# Patient Record
Sex: Male | Born: 1935
Health system: Southern US, Community
[De-identification: ages and names within clinical notes are randomized; demographics above are authoritative.]

## PROBLEM LIST (undated history)

## (undated) DIAGNOSIS — M1990 Unspecified osteoarthritis, unspecified site: Secondary | ICD-10-CM

## (undated) DIAGNOSIS — Z9889 Other specified postprocedural states: Secondary | ICD-10-CM

## (undated) DIAGNOSIS — G4730 Sleep apnea, unspecified: Secondary | ICD-10-CM

## (undated) DIAGNOSIS — H269 Unspecified cataract: Secondary | ICD-10-CM

## (undated) DIAGNOSIS — R0902 Hypoxemia: Secondary | ICD-10-CM

## (undated) DIAGNOSIS — J189 Pneumonia, unspecified organism: Secondary | ICD-10-CM

## (undated) DIAGNOSIS — Z8679 Personal history of other diseases of the circulatory system: Secondary | ICD-10-CM

## (undated) DIAGNOSIS — G4733 Obstructive sleep apnea (adult) (pediatric): Secondary | ICD-10-CM

## (undated) DIAGNOSIS — Z8739 Personal history of other diseases of the musculoskeletal system and connective tissue: Secondary | ICD-10-CM

## (undated) DIAGNOSIS — R7301 Impaired fasting glucose: Secondary | ICD-10-CM

## (undated) DIAGNOSIS — R319 Hematuria, unspecified: Secondary | ICD-10-CM

## (undated) DIAGNOSIS — R58 Hemorrhage, not elsewhere classified: Secondary | ICD-10-CM

## (undated) DIAGNOSIS — M7540 Impingement syndrome of unspecified shoulder: Secondary | ICD-10-CM

## (undated) DIAGNOSIS — C801 Malignant (primary) neoplasm, unspecified: Secondary | ICD-10-CM

## (undated) DIAGNOSIS — K649 Unspecified hemorrhoids: Secondary | ICD-10-CM

## (undated) DIAGNOSIS — R809 Proteinuria, unspecified: Secondary | ICD-10-CM

## (undated) DIAGNOSIS — Z8546 Personal history of malignant neoplasm of prostate: Secondary | ICD-10-CM

## (undated) DIAGNOSIS — Z85828 Personal history of other malignant neoplasm of skin: Secondary | ICD-10-CM

## (undated) DIAGNOSIS — E785 Hyperlipidemia, unspecified: Secondary | ICD-10-CM

## (undated) DIAGNOSIS — I4430 Unspecified atrioventricular block: Secondary | ICD-10-CM

## (undated) DIAGNOSIS — M169 Osteoarthritis of hip, unspecified: Secondary | ICD-10-CM

## (undated) DIAGNOSIS — I10 Essential (primary) hypertension: Secondary | ICD-10-CM

## (undated) DIAGNOSIS — M109 Gout, unspecified: Secondary | ICD-10-CM

## (undated) HISTORY — DX: Gout, unspecified: M10.9

## (undated) HISTORY — PX: TOTAL HIP ARTHROPLASTY: SHX124

## (undated) HISTORY — DX: Obstructive sleep apnea (adult) (pediatric): G47.33

## (undated) HISTORY — DX: Sleep apnea, unspecified: G47.30

## (undated) HISTORY — DX: Malignant (primary) neoplasm, unspecified: C80.1

## (undated) HISTORY — DX: Hypoxemia: R09.02

## (undated) HISTORY — PX: JOINT REPLACEMENT: SHX530

## (undated) HISTORY — DX: Proteinuria, unspecified: R80.9

## (undated) HISTORY — DX: Unspecified hemorrhoids: K64.9

## (undated) HISTORY — DX: Impingement syndrome of unspecified shoulder: M75.40

## (undated) HISTORY — DX: Unspecified cataract: H26.9

## (undated) HISTORY — DX: Impaired fasting glucose: R73.01

## (undated) HISTORY — PX: CARDIAC CATHETERIZATION: SHX172

## (undated) HISTORY — DX: Osteoarthritis of hip, unspecified: M16.9

## (undated) HISTORY — PX: TONSILLECTOMY: SUR1361

## (undated) HISTORY — PX: PROSTATE SURGERY: SHX751

## (undated) HISTORY — DX: Hemorrhage, not elsewhere classified: R58

## (undated) HISTORY — PX: MOHS SURGERY: SUR867

## (undated) HISTORY — PX: COLONOSCOPY: SHX174

## (undated) HISTORY — DX: Unspecified atrioventricular block: I44.30

---

## 2002-08-20 ENCOUNTER — Encounter: Payer: Self-pay | Admitting: Urology

## 2002-08-20 ENCOUNTER — Encounter: Admission: RE | Admit: 2002-08-20 | Discharge: 2002-08-20 | Payer: Self-pay | Admitting: Urology

## 2002-08-26 ENCOUNTER — Ambulatory Visit: Admission: RE | Admit: 2002-08-26 | Discharge: 2002-11-21 | Payer: Self-pay | Admitting: Radiation Oncology

## 2002-11-22 ENCOUNTER — Ambulatory Visit: Admission: RE | Admit: 2002-11-22 | Discharge: 2003-02-20 | Payer: Self-pay | Admitting: Radiation Oncology

## 2003-07-21 ENCOUNTER — Ambulatory Visit (HOSPITAL_COMMUNITY): Admission: RE | Admit: 2003-07-21 | Discharge: 2003-07-21 | Payer: Self-pay | Admitting: Internal Medicine

## 2004-06-08 ENCOUNTER — Ambulatory Visit (HOSPITAL_COMMUNITY): Admission: RE | Admit: 2004-06-08 | Discharge: 2004-06-08 | Payer: Self-pay | Admitting: Internal Medicine

## 2005-12-02 ENCOUNTER — Encounter: Admission: RE | Admit: 2005-12-02 | Discharge: 2005-12-02 | Payer: Self-pay | Admitting: Internal Medicine

## 2007-08-13 HISTORY — PX: EYE SURGERY: SHX253

## 2007-08-13 HISTORY — PX: OTHER SURGICAL HISTORY: SHX169

## 2007-12-29 ENCOUNTER — Ambulatory Visit: Payer: Self-pay | Admitting: Internal Medicine

## 2008-01-26 ENCOUNTER — Encounter: Payer: Self-pay | Admitting: Internal Medicine

## 2008-01-26 ENCOUNTER — Ambulatory Visit: Payer: Self-pay | Admitting: Internal Medicine

## 2008-01-27 ENCOUNTER — Encounter: Payer: Self-pay | Admitting: Internal Medicine

## 2009-08-12 DIAGNOSIS — R809 Proteinuria, unspecified: Secondary | ICD-10-CM

## 2009-08-12 HISTORY — DX: Proteinuria, unspecified: R80.9

## 2010-06-15 ENCOUNTER — Emergency Department (HOSPITAL_COMMUNITY): Admission: EM | Admit: 2010-06-15 | Discharge: 2010-06-15 | Payer: Self-pay | Admitting: Family Medicine

## 2011-06-07 ENCOUNTER — Other Ambulatory Visit (HOSPITAL_COMMUNITY): Payer: Medicare Other

## 2011-06-12 ENCOUNTER — Inpatient Hospital Stay (HOSPITAL_COMMUNITY): Admission: RE | Admit: 2011-06-12 | Payer: Medicare Other | Source: Ambulatory Visit | Admitting: Orthopedic Surgery

## 2011-06-14 NOTE — H&P (Signed)
NAME:  Lance Martin, Lance Martin NO.:  [id number]  MEDICAL RECORD NO.:  [id number]  LOCATION:                                 FACILITY:  PHYSICIAN:  Ollen Gross, M.D.    DATE OF BIRTH:  Dec 21, 1932  DATE OF ADMISSION:  06/12/2011 DATE OF DISCHARGE:                             HISTORY & PHYSICAL   CHIEF COMPLAINT:  Left hip pain.  HISTORY OF PRESENT ILLNESS:  Patient is a 77 year old male seen by Dr. Lequita Halt for ongoing hip pain.  He has had progressive left hip pain for quite some time now.  Over the past half year, it has progressively gotten much worse.  It is to the point where now is limiting his activity.  He has been seen in the office.  His x-rays show he is already developed advanced bone-on-bone arthritis in that hip and has had significant progression and x-rays compared to earlier this year. It is felt due to his continued pain limited impacted function and lack of improvement with medications that is felt he would benefit undergoing surgical intervention.  Risks and benefits have been discussed.  He elects to proceed with surgery.  ALLERGIES:  No known drug allergies.  Intolerance is to ALLOPURINOL caused "renal failure."  CURRENT MEDICATIONS:  Lipitor, Lotensin, amlodipine, Bystolic, furosemide, potassium chloride, TriCor, sulfapyridine, Flonase nasal spray, baby aspirin, Aleve, fish oil.  PAST MEDICAL HISTORY: 1. Cataracts. 2. Hypertension. 3. Hypercholesterolemia. 4. Hemorrhoids. 5. Prostate disease/cancer. 6. History of gout.  PAST SURGICAL HISTORY: 1. Tonsillectomy at age 71. 2. Prostate cancer.  Procedure with radiation. 3. Left ulnar nerve decompression. 4. Cataract surgery. 5. Colonoscopy x2 with removal of benign polyps.  FAMILY HISTORY:  Father deceased at age 64 with history of stroke. Mother deceased at age 68 "old age."  SOCIAL HISTORY:  Married, retired Armed forces operational officer, nonsmoker, 3 to 4 drinks a day, has 2 adopted children.  Has  not decided yet, but he may want to look into a skilled facility for inpatient rehab.  He does have a living will healthcare power of attorney.  REVIEW OF SYSTEMS:  GENERAL:  No fever, chills, or night sweats.  NEURO: No seizures, syncope, or paralysis.  RESPIRATORY:  No shortness breath, productive cough, or hemoptysis.  CARDIOVASCULAR:  No chest pain or orthopnea.  GI:  No nausea, vomiting, diarrhea, or constipation.  GU: Little bit of hematuria, intermittent, this has been worked up in the past.  No dysuria or frequency.  MUSCULOSKELETAL:  Hip pain.  PHYSICAL EXAMINATION:  VITAL SIGNS:  Pulse 64, respirations 12, blood pressure 132/68. General:  A 77 year old white male, well nourished, well developed, in no acute distress.  He is alert, oriented, and cooperative, good historian. HEENT:  Normocephalic, atraumatic.  Pupils are round and reactive.  EOMs intact.  Never wear glasses. NECK:  Supple. CHEST:  Clear. HEART:  Regular rate and rhythm.  S1, S2 noted.  Early faint systolic murmur, best heard over aortic point. ABDOMEN:  Soft, nontender.  Bowel sounds are present. RECTAL/BREASTS/GENITALIA:  Not done.  Not part of present illness. EXTREMITIES:  Right hip flexion 120, internal rotation 30, external rotation 40, abduction 40.  Left hip flexion  90, 0 internal rotation, only about 20 degrees external rotation, about 20 degrees abduction.  IMPRESSION:  Osteoarthritis, left hip.  PLAN:  The patient was admitted to [facility] to undergo a left total hip replacement arthroplasty.  Surgery will be performed by Dr. Ollen Gross.  There are no contraindications of the above-stated procedure such as ongoing infection or progressive neurological disease. Patient either look into home with [facility].     Alexzandrew L. Julien Girt, P.A.C.   ______________________________ Ollen Gross, M.D.    ALP/MEDQ  D:  06/11/2011  T:  06/12/2011   Job:  [id number]  cc:   Loraine Leriche A. Perini, M.D. Fax: [phone]  Electronically Signed by Patrica Duel P.A.C. on 06/13/2011 10:31:23 AM Electronically Signed by Ollen Gross M.D. on 06/14/2011 09:55:57 AM

## 2011-07-09 NOTE — H&P (Signed)
NAME:  Lance Martin, Lance Martin NO.:  [id number]  MEDICAL RECORD NO.:  [id number]  LOCATION:  [facility]                        FACILITY:  [facility]  PHYSICIAN:  Lance Martin, M.D.    DATE OF BIRTH:  11-29-1932  DATE OF ADMISSION:  07/31/2011 DATE OF DISCHARGE:                             HISTORY & PHYSICAL   ADMITTING DIAGNOSIS:  Left hip osteoarthritis.  HISTORY OF PRESENT ILLNESS:  This 77 year old gentleman with a history of left hip osteoarthritis and pain, which has failed conservative measures.  After discussions of treatment options, the patient is now scheduled for total hip arthroplasty of his left hip.  The surgery, risks, benefits, and aftercare were discussed in detail with the patient.  Questions invited and answered.  Note that his medical doctor is Dr. Rodrigo Martin.  He plans on going home after surgery, but his wife had recent back surgery, so he may need to go to a nursing facility based on his progress postoperatively.  DRUG ALLERGIES:  ALLOPURINOL.  CURRENT MEDICATIONS: 1. Lipitor 20 mg daily. 2. Lotensin 40 mg daily. 3. Amlodipine 5 mg daily. 4. Bystolic 20 mg daily. 5. Lasix 40 mg daily. 6. Potassium chloride 10 mEq daily. 7. TriCor 48 mg daily. 8. Sulfapyridine 200 mg daily. 9. Flonase 2 squirts each nostril each day. 10.Aspirin 81 mg b.i.d. 11.Aleve 1 b.i.d. 12.Fish oil.  SERIOUS MEDICAL ILLNESSES:  Include hyperlipidemia, hypertension, allergies.  PREVIOUS SURGERIES:  Include tonsillectomy, ulnar nerve decompression of the wrist and elbow on the left, bilateral cataracts, colonoscopies for benign polyps, and prostate cancer with irradiation.  FAMILY HISTORY:  Positive for stroke and peripheral vascular disease.  SOCIAL HISTORY:  The patient is married.  He is a retired Armed forces operational officer. He does not smoke and has 3 to 4 drinks per day.  REVIEW OF SYSTEMS:  CENTRAL NERVOUS SYSTEM:  Negative for headache or dizziness.  PULMONARY:  Negative for  shortness of breath, PND, or orthopnea.  CARDIOVASCULAR:  Positive for hypertension and hypercholesterolemia.  Negative for chest pain or palpitation.  GI: Positive for history of hemorrhoids.  GU:  Positive for history of prostate cancer with radiation.  MUSCULOSKELETAL:  Positive in HPI.  No history of DVT, PE, or sleep apnea.  PHYSICAL EXAMINATION:  VITAL SIGNS:  Today, blood pressure 140/72, respirations 16, pulse 68 and regular. GENERAL APPEARANCE:  Well-developed, well-nourished gentleman, in no acute distress.  HEENT:  Head, normocephalic.  Nose patent.  Nares patent.  Pupils equal, round, reactive to light.  Throat without injection. NECK:  Supple without adenopathy.  Carotids 2+ without bruit.  CHEST: Clear auscultation.  No rales or rhonchi.  Respirations 16. HEART:  Regular rate and rhythm at 68 beats/minute with a 1/6 systolic ejection murmur. ABDOMEN:  Soft.  Active bowel sounds.  No masses, organomegaly. NEUROLOGIC:  Patient alert, oriented to time, place, and person. Cranial nerves II through XII grossly intact. EXTREMITIES:  Shows the left hip with decreased range of motion with no internal rotation, 20 degrees of external rotation, flexion of 90 with pain at all extremes.  Neurovascular status intact.  IMPRESSION:  Osteoarthritis of the left hip.  PLAN:  Total hip arthroplasty, left hip  [name]. [name], P.A.   ______________________________ Lance Martin, M.D.    SJC/MEDQ  D:  07/09/2011  T:  07/09/2011  Job:  [id number]

## 2011-07-09 NOTE — H&P (Signed)
H&P performed 07/09/2011. Dictation # [id number]

## 2011-07-24 ENCOUNTER — Encounter (HOSPITAL_COMMUNITY): Payer: Self-pay | Admitting: Pharmacy Technician

## 2011-07-26 ENCOUNTER — Ambulatory Visit (HOSPITAL_COMMUNITY)
Admission: RE | Admit: 2011-07-26 | Discharge: 2011-07-26 | Disposition: A | Discharge status: Home / Self Care | Payer: Medicare Other | Source: Ambulatory Visit | Attending: Orthopedic Surgery | Admitting: Orthopedic Surgery

## 2011-07-26 ENCOUNTER — Encounter (HOSPITAL_COMMUNITY)
Admission: RE | Admit: 2011-07-26 | Discharge: 2011-07-26 | Disposition: A | Discharge status: Home / Self Care | Payer: Medicare Other | Source: Ambulatory Visit | Attending: Orthopedic Surgery | Admitting: Orthopedic Surgery

## 2011-07-26 ENCOUNTER — Encounter (HOSPITAL_COMMUNITY): Payer: Self-pay

## 2011-07-26 ENCOUNTER — Other Ambulatory Visit: Payer: Self-pay

## 2011-07-26 DIAGNOSIS — Z01810 Encounter for preprocedural cardiovascular examination: Secondary | ICD-10-CM | POA: Insufficient documentation

## 2011-07-26 DIAGNOSIS — R9431 Abnormal electrocardiogram [ECG] [EKG]: Secondary | ICD-10-CM | POA: Insufficient documentation

## 2011-07-26 DIAGNOSIS — Z01812 Encounter for preprocedural laboratory examination: Secondary | ICD-10-CM | POA: Insufficient documentation

## 2011-07-26 DIAGNOSIS — M1610 Unilateral primary osteoarthritis, unspecified hip: Secondary | ICD-10-CM | POA: Insufficient documentation

## 2011-07-26 DIAGNOSIS — Z01818 Encounter for other preprocedural examination: Secondary | ICD-10-CM | POA: Insufficient documentation

## 2011-07-26 DIAGNOSIS — M169 Osteoarthritis of hip, unspecified: Secondary | ICD-10-CM | POA: Insufficient documentation

## 2011-07-26 HISTORY — DX: Unspecified osteoarthritis, unspecified site: M19.90

## 2011-07-26 HISTORY — DX: Essential (primary) hypertension: I10

## 2011-07-26 LAB — COMPREHENSIVE METABOLIC PANEL
ALT: 15 U/L (ref 0–53)
Alkaline Phosphatase: 52 U/L (ref 39–117)
Chloride: 101 mEq/L (ref 96–112)
GFR calc Af Amer: 54 mL/min — ABNORMAL LOW (ref >90)
Glucose, Bld: 99 mg/dL (ref 70–99)
Potassium: 4 mEq/L (ref 3.5–5.1)
Sodium: 141 mEq/L (ref 135–145)
Total Protein: 7.3 g/dL (ref 6.0–8.3)

## 2011-07-26 LAB — URINALYSIS, ROUTINE W REFLEX MICROSCOPIC
Glucose, UA: NEGATIVE mg/dL
Hgb urine dipstick: NEGATIVE
Protein, ur: NEGATIVE mg/dL
Specific Gravity, Urine: 1.008 (ref 1.005–1.030)

## 2011-07-26 LAB — CBC
Hemoglobin: 13.5 g/dL (ref 13.0–17.0)
MCHC: 34.7 g/dL (ref 30.0–36.0)
RBC: 4.1 MIL/uL — ABNORMAL LOW (ref 4.22–5.81)
WBC: 7.3 10*3/uL (ref 4.0–10.5)

## 2011-07-26 LAB — SURGICAL PCR SCREEN
MRSA, PCR: NEGATIVE
Staphylococcus aureus: POSITIVE — AB

## 2011-07-26 LAB — APTT: aPTT: 30 seconds (ref 24–37)

## 2011-07-26 NOTE — Patient Instructions (Signed)
[address] [facility]  07/26/2011   Your procedure is scheduled on: Wednesday 12/19  AT 2:00 PM  Report to [facility] at 11:30 AM.  Call this number if you have problems the morning of surgery: [phone]   Remember:   Do not eat food AFTER MIDNIGHT - THE NIGHT BEFORE YOUR SURGERY  May have clear liquids FROM MIDNIGHT UNTIL 8:00 AM THE AM OF YOUR SURGERY.  Clear liquids include soda, tea, black coffee, apple or grape juice.  NOTHING TO DRINK AFTER 8:OOAM THE DAY OF YOUR SURGERY  Take these medicines the morning of surgery with A SIP OF WATER: NO MEDS TO TAKE   Do not wear jewelry, make-up or nail polish.  Do not wear lotions, powders, or perfumes. You may wear deodorant.  Do not shave 48 hours prior to surgery.  Do not bring valuables to the hospital.  Contacts, dentures or bridgework may not be worn into surgery.  Leave suitcase in the car. After surgery it may be brought to your room.  For patients admitted to the hospital, checkout time is 11:00 AM the day of discharge.   Patients discharged the day of surgery will not be allowed to drive home.    Special Instructions: CHG Shower Use Special Wash: 1/2 bottle night before surgery and 1/2 bottle morning of surgery.   Please read over the following fact sheets that you were given: Blood Transfusion Information and MRSA Information AND INCENTIVE SPIROMETER INSTRUCTIONS

## 2011-07-30 MED ORDER — BUPIVACAINE 0.25 % ON-Q PUMP SINGLE CATH 300ML
300.0000 mL | INJECTION | Status: DC
  Filled 2011-07-30: qty 300

## 2011-07-31 ENCOUNTER — Inpatient Hospital Stay (HOSPITAL_COMMUNITY): Payer: Medicare Other | Admitting: Anesthesiology

## 2011-07-31 ENCOUNTER — Inpatient Hospital Stay (HOSPITAL_COMMUNITY)
Admission: RE | Admit: 2011-07-31 | Discharge: 2011-08-04 | DRG: 470 | Disposition: A | Discharge status: Home Health | Payer: Medicare Other | Source: Ambulatory Visit | Attending: Orthopedic Surgery | Admitting: Orthopedic Surgery

## 2011-07-31 ENCOUNTER — Encounter (HOSPITAL_COMMUNITY): Payer: Self-pay | Admitting: Anesthesiology

## 2011-07-31 ENCOUNTER — Inpatient Hospital Stay (HOSPITAL_COMMUNITY): Payer: Medicare Other

## 2011-07-31 ENCOUNTER — Encounter (HOSPITAL_COMMUNITY): Payer: Self-pay | Admitting: *Deleted

## 2011-07-31 ENCOUNTER — Encounter (HOSPITAL_COMMUNITY)
Admission: RE | Disposition: A | Discharge status: Home Health | Payer: Self-pay | Source: Ambulatory Visit | Attending: Orthopedic Surgery

## 2011-07-31 ENCOUNTER — Encounter (HOSPITAL_COMMUNITY): Payer: Self-pay | Admitting: Orthopedic Surgery

## 2011-07-31 DIAGNOSIS — I10 Essential (primary) hypertension: Secondary | ICD-10-CM | POA: Diagnosis present

## 2011-07-31 DIAGNOSIS — E785 Hyperlipidemia, unspecified: Secondary | ICD-10-CM | POA: Diagnosis present

## 2011-07-31 DIAGNOSIS — M1610 Unilateral primary osteoarthritis, unspecified hip: Principal | ICD-10-CM | POA: Diagnosis present

## 2011-07-31 DIAGNOSIS — Z8546 Personal history of malignant neoplasm of prostate: Secondary | ICD-10-CM

## 2011-07-31 DIAGNOSIS — M169 Osteoarthritis of hip, unspecified: Principal | ICD-10-CM | POA: Diagnosis present

## 2011-07-31 HISTORY — PX: TOTAL HIP ARTHROPLASTY: SHX124

## 2011-07-31 LAB — TYPE AND SCREEN
ABO/RH(D): O POS
Antibody Screen: NEGATIVE

## 2011-07-31 LAB — ABO/RH: ABO/RH(D): O POS

## 2011-07-31 SURGERY — ARTHROPLASTY, HIP, TOTAL,POSTERIOR APPROACH
Anesthesia: Spinal | Site: Hip | Laterality: Left | Wound class: Clean

## 2011-07-31 MED ORDER — PHENOL 1.4 % MT LIQD: 1.0000 | OROMUCOSAL | Status: DC | PRN

## 2011-07-31 MED ORDER — ONDANSETRON HCL 4 MG/2ML IJ SOLN: 4.0000 mg | Freq: Four times a day (QID) | INTRAMUSCULAR | Status: DC | PRN

## 2011-07-31 MED ORDER — PROPOFOL 10 MG/ML IV EMUL
INTRAVENOUS | Status: DC | PRN
  Administered 2011-07-31: 100 ug/kg/min via INTRAVENOUS

## 2011-07-31 MED ORDER — LACTATED RINGERS IV SOLN
INTRAVENOUS | Status: DC | PRN
  Administered 2011-07-31 (×3): via INTRAVENOUS

## 2011-07-31 MED ORDER — TEMAZEPAM 15 MG PO CAPS: 15.0000 mg | ORAL_CAPSULE | Freq: Every evening | ORAL | Status: DC | PRN

## 2011-07-31 MED ORDER — CEFAZOLIN SODIUM 1-5 GM-% IV SOLN
INTRAVENOUS | Status: AC
  Filled 2011-07-31: qty 50

## 2011-07-31 MED ORDER — POTASSIUM CHLORIDE CRYS ER 10 MEQ PO TBCR
10.0000 meq | EXTENDED_RELEASE_TABLET | Freq: Every day | ORAL | Status: DC
  Administered 2011-07-31 – 2011-08-04 (×5): 10 meq via ORAL
  Filled 2011-07-31 (×5): qty 1

## 2011-07-31 MED ORDER — LACTATED RINGERS IV SOLN: INTRAVENOUS | Status: DC

## 2011-07-31 MED ORDER — FUROSEMIDE 20 MG PO TABS
20.0000 mg | ORAL_TABLET | ORAL | Status: DC
  Administered 2011-07-31 – 2011-08-04 (×3): 20 mg via ORAL
  Filled 2011-07-31 (×3): qty 1

## 2011-07-31 MED ORDER — POTASSIUM CHLORIDE IN NACL 20-0.45 MEQ/L-% IV SOLN
INTRAVENOUS | Status: DC
  Administered 2011-07-31 – 2011-08-01 (×3): via INTRAVENOUS
  Filled 2011-07-31 (×5): qty 1000

## 2011-07-31 MED ORDER — BUPIVACAINE LIPOSOME 1.3 % IJ SUSP
20.0000 mL | Freq: Once | INTRAMUSCULAR | Status: DC
  Filled 2011-07-31: qty 20

## 2011-07-31 MED ORDER — METHOCARBAMOL 100 MG/ML IJ SOLN
500.0000 mg | Freq: Four times a day (QID) | INTRAVENOUS | Status: DC | PRN
  Administered 2011-07-31: 500 mg via INTRAVENOUS
  Filled 2011-07-31: qty 5

## 2011-07-31 MED ORDER — ACETAMINOPHEN 10 MG/ML IV SOLN
1000.0000 mg | Freq: Four times a day (QID) | INTRAVENOUS | Status: AC
  Administered 2011-07-31 – 2011-08-01 (×4): 1000 mg via INTRAVENOUS
  Filled 2011-07-31 (×4): qty 100

## 2011-07-31 MED ORDER — HYDROMORPHONE HCL PF 1 MG/ML IJ SOLN
0.2500 mg | INTRAMUSCULAR | Status: DC | PRN
  Administered 2011-07-31: 0.5 mg via INTRAVENOUS

## 2011-07-31 MED ORDER — BENAZEPRIL HCL 40 MG PO TABS
40.0000 mg | ORAL_TABLET | Freq: Every day | ORAL | Status: DC
  Administered 2011-07-31 – 2011-08-02 (×3): 40 mg via ORAL
  Filled 2011-07-31 (×5): qty 1

## 2011-07-31 MED ORDER — ONDANSETRON HCL 4 MG PO TABS: 4.0000 mg | ORAL_TABLET | Freq: Four times a day (QID) | ORAL | Status: DC | PRN

## 2011-07-31 MED ORDER — NEBIVOLOL HCL 10 MG PO TABS
20.0000 mg | ORAL_TABLET | Freq: Every day | ORAL | Status: DC
  Administered 2011-07-31 – 2011-08-03 (×4): 20 mg via ORAL
  Filled 2011-07-31 (×5): qty 2

## 2011-07-31 MED ORDER — DEXAMETHASONE SODIUM PHOSPHATE 10 MG/ML IJ SOLN
INTRAMUSCULAR | Status: DC | PRN
  Administered 2011-07-31: 10 mg via INTRAVENOUS

## 2011-07-31 MED ORDER — METHOCARBAMOL 500 MG PO TABS
500.0000 mg | ORAL_TABLET | Freq: Four times a day (QID) | ORAL | Status: DC | PRN
  Administered 2011-08-01 – 2011-08-03 (×8): 500 mg via ORAL
  Filled 2011-07-31 (×8): qty 1

## 2011-07-31 MED ORDER — HYDROMORPHONE HCL PF 1 MG/ML IJ SOLN
INTRAMUSCULAR | Status: AC
  Filled 2011-07-31: qty 1

## 2011-07-31 MED ORDER — BISACODYL 10 MG RE SUPP: 10.0000 mg | Freq: Every day | RECTAL | Status: DC | PRN

## 2011-07-31 MED ORDER — DOCUSATE SODIUM 100 MG PO CAPS
100.0000 mg | ORAL_CAPSULE | Freq: Two times a day (BID) | ORAL | Status: DC
  Administered 2011-07-31 – 2011-08-03 (×7): 100 mg via ORAL
  Filled 2011-07-31 (×9): qty 1

## 2011-07-31 MED ORDER — BUPIVACAINE HCL 0.75 % IJ SOLN
INTRAMUSCULAR | Status: DC | PRN
  Administered 2011-07-31: 15 mg via INTRATHECAL

## 2011-07-31 MED ORDER — CEFAZOLIN SODIUM-DEXTROSE 2-3 GM-% IV SOLR
2.0000 g | Freq: Once | INTRAVENOUS | Status: AC
  Administered 2011-07-31: 2 g via INTRAVENOUS

## 2011-07-31 MED ORDER — RIVAROXABAN 10 MG PO TABS
10.0000 mg | ORAL_TABLET | Freq: Every day | ORAL | Status: DC
  Administered 2011-08-01 – 2011-08-04 (×4): 10 mg via ORAL
  Filled 2011-07-31 (×4): qty 1

## 2011-07-31 MED ORDER — BUPIVACAINE LIPOSOME 1.3 % IJ SUSP
INTRAMUSCULAR | Status: DC | PRN
  Administered 2011-07-31: 20 mL

## 2011-07-31 MED ORDER — ACETAMINOPHEN 325 MG PO TABS: 650.0000 mg | ORAL_TABLET | Freq: Four times a day (QID) | ORAL | Status: DC | PRN

## 2011-07-31 MED ORDER — MIDAZOLAM HCL 5 MG/5ML IJ SOLN
INTRAMUSCULAR | Status: DC | PRN
  Administered 2011-07-31 (×4): 1 mg via INTRAVENOUS

## 2011-07-31 MED ORDER — EPHEDRINE SULFATE 50 MG/ML IJ SOLN
INTRAMUSCULAR | Status: DC | PRN
  Administered 2011-07-31 (×3): 5 mg via INTRAVENOUS

## 2011-07-31 MED ORDER — ACETAMINOPHEN 650 MG RE SUPP: 650.0000 mg | Freq: Four times a day (QID) | RECTAL | Status: DC | PRN

## 2011-07-31 MED ORDER — MENTHOL 3 MG MT LOZG: 1.0000 | LOZENGE | OROMUCOSAL | Status: DC | PRN

## 2011-07-31 MED ORDER — FLUTICASONE PROPIONATE 50 MCG/ACT NA SUSP
2.0000 | Freq: Every day | NASAL | Status: DC | PRN
  Filled 2011-07-31: qty 16

## 2011-07-31 MED ORDER — SIMVASTATIN 40 MG PO TABS
40.0000 mg | ORAL_TABLET | Freq: Every day | ORAL | Status: DC
  Administered 2011-07-31: 40 mg via ORAL
  Filled 2011-07-31 (×2): qty 1

## 2011-07-31 MED ORDER — AMLODIPINE BESYLATE 5 MG PO TABS
5.0000 mg | ORAL_TABLET | Freq: Every evening | ORAL | Status: DC
  Administered 2011-07-31 – 2011-08-03 (×4): 5 mg via ORAL
  Filled 2011-07-31 (×5): qty 1

## 2011-07-31 MED ORDER — DIPHENHYDRAMINE HCL 12.5 MG/5ML PO ELIX: 12.5000 mg | ORAL_SOLUTION | ORAL | Status: DC | PRN

## 2011-07-31 MED ORDER — POLYETHYLENE GLYCOL 3350 17 G PO PACK
17.0000 g | PACK | Freq: Every day | ORAL | Status: DC | PRN
  Administered 2011-08-03: 17 g via ORAL
  Filled 2011-07-31 (×2): qty 1

## 2011-07-31 MED ORDER — METOCLOPRAMIDE HCL 5 MG/ML IJ SOLN: 5.0000 mg | Freq: Three times a day (TID) | INTRAMUSCULAR | Status: DC | PRN

## 2011-07-31 MED ORDER — METOCLOPRAMIDE HCL 10 MG PO TABS: 5.0000 mg | ORAL_TABLET | Freq: Three times a day (TID) | ORAL | Status: DC | PRN

## 2011-07-31 MED ORDER — FENOFIBRATE 54 MG PO TABS
54.0000 mg | ORAL_TABLET | Freq: Every day | ORAL | Status: DC
  Administered 2011-07-31 – 2011-08-03 (×4): 54 mg via ORAL
  Filled 2011-07-31 (×5): qty 1

## 2011-07-31 MED ORDER — FLEET ENEMA 7-19 GM/118ML RE ENEM: 1.0000 | ENEMA | Freq: Once | RECTAL | Status: AC | PRN

## 2011-07-31 MED ORDER — OXYCODONE HCL 5 MG PO TABS
5.0000 mg | ORAL_TABLET | ORAL | Status: DC | PRN
  Administered 2011-08-01 (×3): 5 mg via ORAL
  Administered 2011-08-01: 10 mg via ORAL
  Administered 2011-08-02 (×2): 5 mg via ORAL
  Administered 2011-08-02 – 2011-08-04 (×7): 10 mg via ORAL
  Filled 2011-07-31: qty 1
  Filled 2011-07-31: qty 2
  Filled 2011-07-31: qty 1
  Filled 2011-07-31 (×4): qty 2
  Filled 2011-07-31: qty 1
  Filled 2011-07-31 (×2): qty 2
  Filled 2011-07-31 (×2): qty 1
  Filled 2011-07-31: qty 2

## 2011-07-31 MED ORDER — ACETAMINOPHEN 10 MG/ML IV SOLN
INTRAVENOUS | Status: DC | PRN
  Administered 2011-07-31: 1000 mg via INTRAVENOUS

## 2011-07-31 MED ORDER — CEFAZOLIN SODIUM 1-5 GM-% IV SOLN
1.0000 g | Freq: Four times a day (QID) | INTRAVENOUS | Status: AC
  Administered 2011-07-31 – 2011-08-01 (×3): 1 g via INTRAVENOUS
  Filled 2011-07-31 (×3): qty 50

## 2011-07-31 MED ORDER — KETAMINE HCL 10 MG/ML IJ SOLN
INTRAMUSCULAR | Status: DC | PRN
  Administered 2011-07-31 (×4): 10 mg via INTRAVENOUS

## 2011-07-31 MED ORDER — FENTANYL CITRATE 0.05 MG/ML IJ SOLN
INTRAMUSCULAR | Status: DC | PRN
  Administered 2011-07-31: 100 ug via INTRAVENOUS

## 2011-07-31 MED ORDER — CEFAZOLIN SODIUM-DEXTROSE 2-3 GM-% IV SOLR: 2.0000 g | Freq: Once | INTRAVENOUS | Status: DC

## 2011-07-31 MED ORDER — MORPHINE SULFATE 2 MG/ML IJ SOLN
1.0000 mg | INTRAMUSCULAR | Status: DC | PRN
  Administered 2011-07-31 – 2011-08-01 (×4): 2 mg via INTRAVENOUS
  Filled 2011-07-31 (×5): qty 1

## 2011-07-31 MED ORDER — PROMETHAZINE HCL 25 MG/ML IJ SOLN: 6.2500 mg | INTRAMUSCULAR | Status: DC | PRN

## 2011-07-31 MED ORDER — LORATADINE 10 MG PO TABS
10.0000 mg | ORAL_TABLET | Freq: Every day | ORAL | Status: DC
  Administered 2011-07-31 – 2011-08-03 (×4): 10 mg via ORAL
  Filled 2011-07-31 (×5): qty 1

## 2011-07-31 SURGICAL SUPPLY — 54 items
BAG SPEC THK2 15X12 ZIP CLS (MISCELLANEOUS) ×1
BAG ZIPLOCK 12X15 (MISCELLANEOUS) ×2 IMPLANT
BIT DRILL 2.8X128 (BIT) ×2 IMPLANT
BLADE EXTENDED COATED 6.5IN (ELECTRODE) ×2 IMPLANT
BLADE SAW SAG 73X25 THK (BLADE) ×1
BLADE SAW SGTL 73X25 THK (BLADE) ×1 IMPLANT
CLOTH BEACON ORANGE TIMEOUT ST (SAFETY) ×2 IMPLANT
DECANTER SPIKE VIAL GLASS SM (MISCELLANEOUS) ×2 IMPLANT
DRAPE INCISE IOBAN 66X45 STRL (DRAPES) ×2 IMPLANT
DRAPE ORTHO SPLIT 77X108 STRL (DRAPES) ×4
DRAPE POUCH INSTRU U-SHP 10X18 (DRAPES) ×2 IMPLANT
DRAPE SURG ORHT 6 SPLT 77X108 (DRAPES) ×2 IMPLANT
DRAPE U-SHAPE 47X51 STRL (DRAPES) ×2 IMPLANT
DRESSING ALLEVYN BORDER 5X5 (GAUZE/BANDAGES/DRESSINGS) ×1 IMPLANT
DRESSING ALLEVYN BORDER HEEL (GAUZE/BANDAGES/DRESSINGS) ×1 IMPLANT
DRSG ADAPTIC 3X8 NADH LF (GAUZE/BANDAGES/DRESSINGS) ×2 IMPLANT
DRSG MEPILEX BORDER 4X4 (GAUZE/BANDAGES/DRESSINGS) ×2 IMPLANT
DRSG MEPILEX BORDER 4X8 (GAUZE/BANDAGES/DRESSINGS) ×2 IMPLANT
DURAPREP 26ML APPLICATOR (WOUND CARE) ×2 IMPLANT
ELECT REM PT RETURN 9FT ADLT (ELECTROSURGICAL) ×2
ELECTRODE REM PT RTRN 9FT ADLT (ELECTROSURGICAL) ×1 IMPLANT
EVACUATOR 1/8 PVC DRAIN (DRAIN) ×2 IMPLANT
FACESHIELD LNG OPTICON STERILE (SAFETY) ×8 IMPLANT
GAUZE SPONGE 4X4 12PLY STRL LF (GAUZE/BANDAGES/DRESSINGS) ×1 IMPLANT
GLOVE BIO SURGEON STRL SZ7.5 (GLOVE) ×2 IMPLANT
GLOVE BIO SURGEON STRL SZ8 (GLOVE) ×3 IMPLANT
GLOVE BIOGEL PI IND STRL 8 (GLOVE) ×2 IMPLANT
GLOVE BIOGEL PI INDICATOR 8 (GLOVE) ×2
GOWN STRL NON-REIN LRG LVL3 (GOWN DISPOSABLE) ×2 IMPLANT
GOWN STRL REIN XL XLG (GOWN DISPOSABLE) ×2 IMPLANT
IMMOBILIZER KNEE 20 (SOFTGOODS)
IMMOBILIZER KNEE 20 THIGH 36 (SOFTGOODS) IMPLANT
KIT BASIN OR (CUSTOM PROCEDURE TRAY) ×2 IMPLANT
MANIFOLD NEPTUNE II (INSTRUMENTS) ×2 IMPLANT
NDL SAFETY ECLIPSE 18X1.5 (NEEDLE) ×1 IMPLANT
NEEDLE HYPO 18GX1.5 SHARP (NEEDLE) ×4
NS IRRIG 1000ML POUR BTL (IV SOLUTION) ×2 IMPLANT
PACK TOTAL JOINT (CUSTOM PROCEDURE TRAY) ×2 IMPLANT
PASSER SUT SWANSON 36MM LOOP (INSTRUMENTS) ×2 IMPLANT
POSITIONER SURGICAL ARM (MISCELLANEOUS) ×2 IMPLANT
SPONGE GAUZE 4X4 12PLY (GAUZE/BANDAGES/DRESSINGS) ×2 IMPLANT
STRIP CLOSURE SKIN 1/2X4 (GAUZE/BANDAGES/DRESSINGS) ×4 IMPLANT
SUT ETHIBOND NAB CT1 #1 30IN (SUTURE) ×4 IMPLANT
SUT MNCRL AB 4-0 PS2 18 (SUTURE) ×2 IMPLANT
SUT VIC AB 1 CT1 27 (SUTURE) ×6
SUT VIC AB 1 CT1 27XBRD ANTBC (SUTURE) ×3 IMPLANT
SUT VIC AB 2-0 CT1 27 (SUTURE) ×6
SUT VIC AB 2-0 CT1 TAPERPNT 27 (SUTURE) ×3 IMPLANT
SYR 20CC LL (SYRINGE) ×1 IMPLANT
SYR 50ML LL SCALE MARK (SYRINGE) ×2 IMPLANT
TOWEL OR 17X26 10 PK STRL BLUE (TOWEL DISPOSABLE) ×5 IMPLANT
TOWEL OR NON WOVEN STRL DISP B (DISPOSABLE) ×2 IMPLANT
TRAY FOLEY CATH 14FRSI W/METER (CATHETERS) ×2 IMPLANT
WATER STERILE IRR 1500ML POUR (IV SOLUTION) ×2 IMPLANT

## 2011-07-31 NOTE — Interval H&P Note (Signed)
History and Physical Interval Note:  07/31/2011 3:17 PM  Lance Martin  has presented today for surgery, with the diagnosis of osteoarthritis left hip   The various methods of treatment have been discussed with the patient and family. After consideration of risks, benefits and other options for treatment, the patient has consented to  Procedure(s): TOTAL HIP ARTHROPLASTY as a surgical intervention .  The patients' history has been reviewed, patient examined, no change in status, stable for surgery.  I have reviewed the patients' chart and labs.  Questions were answered to the patient's satisfaction.     Loanne Drilling

## 2011-07-31 NOTE — Anesthesia Postprocedure Evaluation (Signed)
  Anesthesia Post-op Note  Patient: Lance Martin  Procedure(s) Performed:  TOTAL HIP ARTHROPLASTY  Patient Location: PACU  Anesthesia Type: Spinal  Level of Consciousness: awake and alert   Airway and Oxygen Therapy: Patient Spontanous Breathing  Post-op Pain: mild  Post-op Assessment: Post-op Vital signs reviewed, Patient's Cardiovascular Status Stable, Respiratory Function Stable, Patent Airway and No signs of Nausea or vomiting  Post-op Vital Signs: stable  Complications: No apparent anesthesia complications. Moving both feet. Denies complaints.

## 2011-07-31 NOTE — Op Note (Signed)
Pre-operative diagnosis- Osteoarthritis Left hip  Post-operative diagnosis- Osteoarthritis  Left hip  Procedure-  LeftTotal Hip Arthroplasty  Surgeon- Gus Rankin. Manoj Enriquez, MD  Assistant- Avel Peace, PA-C   Anesthesia  Spinal  EBL- 200   Drain Hemovac   Complication- None  Condition-PACU - hemodynamically stable.   Brief Clinical Note- Lance Martin is a 77 y.o. male with end stage arthritis of his left hip with progressively worsening pain and dysfunction. Pain occurs with activity and rest including pain at night. He has tried analgesics, protected weight bearing and rest without benefit. Pain is too severe to attempt physical therapy. Radiographs demonstrate bone on bone arthritis with subchondral cyst formation. He presents now for left THA.  Procedure in detail-   The patient is brought into the operating room and placed on the operating table. After successful administration of Spinal  anesthesia, the patient is placed in the  right lateral decubitus position with the  Left side up and held in place with the hip positioner. The lower extremity is isolated from the perineum with plastic drapes and time-out is performed by the surgical team. The lower extremity is then prepped and draped in the usual sterile fashion. A short posterolateral incision is made with a ten blade through the subcutaneous tissue to the level of the fascia lata which is incised in line with the skin incision. The sciatic nerve is palpated and protected and the short external rotators and capsule are isolated from the femur. The hip is then dislocated and the center of the femoral head is marked. A trial prosthesis is placed such that the trial head corresponds to the center of the patients' native femoral head. The resection level is marked on the femoral neck and the resection is made with an oscillating saw. The femoral head is removed and femoral retractors placed to gain access to the femoral canal.      The  canal finder is passed into the femoral canal and the canal is thoroughly irrigated with sterile saline to remove the fatty contents. Axial reaming is performed to 13.5  mm, proximal reaming to 67F  and the sleeve machined to a large. A 67F large trial sleeve is placed into the proximal femur.      The femur is then retracted anteriorly to gain acetabular exposure. Acetabular retractors are placed and the labrum and osteophytes are removed, Acetabular reaming is performed to 53  mm and a 54  mm Pinnacle acetabular shell is placed in anatomic position with excellent purchase. Additional dome screws were placed. An apex hole eliminator is placed and the permanent 36 mm neutral plus 4 Marathon liner is placed into the acetabular shell.      The trial femur is then placed into the femoral canal. The size is 18 x 13  stem with a 36 + 12  neck and a 36 + 6 head with the neck version 10 degrees beyond the patients' native anteversion. The hip is reduced with excellent stability with full extension and full external rotation, 70 degrees flexion with 40 degrees adduction and 90 degrees internal rotation and 90 degrees of flexion with 70 degrees of internal rotation. The operative leg is placed on top of the non-operative leg and the leg lengths are found to be equal. The trials are then removed and the permanent implant of the same size is impacted into the femoral canal. The metal femoral head of the same size as the trial is placed and the hip is  reduced with the same stability parameters. The operative leg is again placed on top of the non-operative leg and the leg lengths are found to be equal.      The wound is then copiously irrigated with saline solution and the capsule and short external rotators are re-attached to the femur through drill holes with Ethibond suture. The fascia lata is closed over a hemovac drain with #1 vicryl suture and the fascia lata, gluteal muscles and subcutaneous tissues are injected with  Exparel 20ml diluted with saline 50ml. The subcutaneous tissues are closed with #1 and2-0 vicryl and the subcuticular layer closed with running 4-0 Monocryl. The drain is hooked to suction, incision cleaned and dried, and steri-srips and a bulky sterile dressing applied. The limb is placed into a knee immobilizer and the patient is awakened and transported to recovery in stable condition.      Please note that a surgical assistant was a medical necessity for this procedure in order to perform it in a safe and expeditious manner. The assistant was necessary to provide retraction to the vital neurovascular structures and to retract and position the limb to allow for anatomic placement of the prosthetic components.  Gus Rankin Rafael Quesada, MD    07/31/2011, 4:37 PM

## 2011-07-31 NOTE — Transfer of Care (Signed)
Immediate Anesthesia Transfer of Care Note  Patient: Lance Martin  Procedure(s) Performed:  TOTAL HIP ARTHROPLASTY  Patient Location: PACU  Anesthesia Type: Spinal  Level of Consciousness: awake, alert  and patient cooperative  Airway & Oxygen Therapy: Patient Spontanous Breathing and Patient connected to face mask oxygen  Post-op Assessment: Report given to PACU RN and Post -op Vital signs reviewed and stable  Post vital signs: Reviewed and stable  Complications: No apparent anesthesia complications

## 2011-07-31 NOTE — Anesthesia Procedure Notes (Addendum)
Spinal  Patient location during procedure: OR Start time: 07/31/2011 3:33 PM Staffing Anesthesiologist: Azell Der Performed by: anesthesiologist  Preanesthetic Checklist Completed: patient identified, site marked, surgical consent, pre-op evaluation, timeout performed, IV checked, risks and benefits discussed and monitors and equipment checked Spinal Block Patient position: sitting Prep: Betadine Patient monitoring: heart rate, continuous pulse ox and blood pressure Approach: midline Location: L3-4 Injection technique: single-shot Needle Needle type: Spinocan  Needle gauge: 22 G Needle length: 9 cm Additional Notes Expiration date of kit checked and confirmed. Patient tolerated procedure well, without complications. No paresthesia. No blood in CSF.

## 2011-07-31 NOTE — Anesthesia Preprocedure Evaluation (Addendum)
Anesthesia Evaluation  Patient identified by MRN, date of birth, ID band Patient awake    Reviewed: Allergy & Precautions, H&P , NPO status , Patient's Chart, lab work & pertinent test results  Airway Mallampati: II TM Distance: >3 FB Neck ROM: Full    Dental No notable dental hx.    Pulmonary neg pulmonary ROS,  clear to auscultation  Pulmonary exam normal       Cardiovascular hypertension, Pt. on medications neg cardio ROS Regular Normal    Neuro/Psych Negative Neurological ROS  Negative Psych ROS   GI/Hepatic negative GI ROS, Neg liver ROS,   Endo/Other  Negative Endocrine ROS  Renal/GU negative Renal ROS  Genitourinary negative   Musculoskeletal negative musculoskeletal ROS (+)   Abdominal   Peds negative pediatric ROS (+)  Hematology negative hematology ROS (+)   Anesthesia Other Findings   Reproductive/Obstetrics negative OB ROS                           Anesthesia Physical Anesthesia Plan  ASA: II  Anesthesia Plan: Spinal   Post-op Pain Management:    Induction: Intravenous  Airway Management Planned: Simple Face Mask  Additional Equipment:   Intra-op Plan:   Post-operative Plan: Extubation in OR  Informed Consent: I have reviewed the patients History and Physical, chart, labs and discussed the procedure including the risks, benefits and alternatives for the proposed anesthesia with the patient or authorized representative who has indicated his/her understanding and acceptance.   Dental advisory given  Plan Discussed with: CRNA  Anesthesia Plan Comments: (Discussed r/b spinal including bleeding, infection, nerve damage, headache, failure, backache. Pt. Prefers spinal.)       Anesthesia Quick Evaluation

## 2011-08-01 LAB — BASIC METABOLIC PANEL
Chloride: 98 mEq/L (ref 96–112)
GFR calc Af Amer: 58 mL/min — ABNORMAL LOW (ref >90)
GFR calc non Af Amer: 50 mL/min — ABNORMAL LOW (ref >90)
Potassium: 3.8 mEq/L (ref 3.5–5.1)
Sodium: 136 mEq/L (ref 135–145)

## 2011-08-01 LAB — CBC
HCT: 32.2 % — ABNORMAL LOW (ref 39.0–52.0)
MCHC: 34.8 g/dL (ref 30.0–36.0)
Platelets: 179 10*3/uL (ref 150–400)
RDW: 12.6 % (ref 11.5–15.5)
WBC: 9.4 10*3/uL (ref 4.0–10.5)

## 2011-08-01 MED ORDER — ATORVASTATIN CALCIUM 10 MG PO TABS
20.0000 mg | ORAL_TABLET | Freq: Every day | ORAL | Status: DC
  Administered 2011-08-02 – 2011-08-03 (×2): 20 mg via ORAL
  Filled 2011-08-01 (×3): qty 2

## 2011-08-01 NOTE — Progress Notes (Signed)
Physical Therapy Treatment Patient Details Name: Lance Martin MRN: [id number] DOB: 18-Nov-1932 Today's Date: 08/01/2011 1430 - 1457; 2GT PT Assessment/Plan  PT - Assessment/Plan PT Plan: Discharge plan remains appropriate PT Frequency: 7X/week Follow Up Recommendations: Home health PT Equipment Recommended: Rolling walker with 5" wheels PT Goals  Acute Rehab PT Goals Time For Goal Achievement: 5 days Pt will go Supine/Side to Sit: with modified independence PT Goal: Supine/Side to Sit - Progress: Progressing toward goal Pt will go Sit to Stand: with modified independence PT Goal: Sit to Stand - Progress: Progressing toward goal Pt will go Stand to Sit: with modified independence PT Goal: Stand to Sit - Progress: Progressing toward goal Pt will Ambulate: >150 feet;with modified independence;with rolling walker PT Goal: Ambulate - Progress: Progressing toward goal Pt will Perform Home Exercise Program: with min assist  PT Treatment Precautions/Restrictions  Precautions Precautions: Posterior Hip Precaution Booklet Issued: Yes (comment) Precaution Comments: instructed pt in posterior hip precautions Required Braces or Orthoses: No Restrictions Weight Bearing Restrictions: Yes LLE Weight Bearing: Partial weight bearing LLE Partial Weight Bearing Percentage or Pounds: 25-50% Mobility (including Balance) Bed Mobility Sit to Supine - Right: 4: Min assist Sit to Supine - Right Details (indicate cue type and reason): cues for sequence and adherence to THP Transfers Sit to Stand: 4: Min assist;With armrests;With upper extremity assist;From chair/3-in-1 Sit to Stand Details (indicate cue type and reason): cues for LE position and use of UEs Stand to Sit: 4: Min assist;To bed;With upper extremity assist Stand to Sit Details: cues for LE position and use of UEs Ambulation/Gait Ambulation/Gait Assistance: 4: Min assist Ambulation/Gait Assistance Details (indicate cue type and reason):  cues for sequence, ER on L, position from RW and step-to gait Ambulation Distance (Feet): 98 Feet Assistive device: Rolling walker Gait Pattern: Step-to pattern    Exercise    End of Session PT - End of Session Activity Tolerance: Patient tolerated treatment well Patient left: in bed;with call bell in reach;with family/visitor present Nurse Communication: Mobility status for transfers;Mobility status for ambulation General Behavior During Session: [facility] for tasks performed Cognition: [facility] for tasks performed  Colston Pyle 08/01/2011, 3:43 PM

## 2011-08-01 NOTE — Progress Notes (Signed)
Physical Therapy Evaluation Patient Details Name: Lance Martin MRN: [id number] DOB: May 18, 1933 Today's Date: 08/01/2011  11:08-11:32 Eval 2  Problem List: There is no problem list on file for this patient.   Past Medical History:  Past Medical History  Diagnosis Date  . Hypertension   . Arthritis     OA LEFT HIP-PLANS LEFT TOTAL HIP REPLACEMENT   Past Surgical History:  Past Surgical History  Procedure Date  . Left elbow surgery 2009    ELBOW AND WRIST SURGERY  . Eye surgery 2009    RIGHT CATARACT EXTRACTION  . Tonsillectomy     AGE 77    PT Assessment/Plan/Recommendation PT Assessment Clinical Impression Statement: Pt did well for POD #1. Expect good progress. Pt should be able to DC home with HHPT.  PT Recommendation/Assessment: Patient will need skilled PT in the acute care venue PT Problem List: Decreased strength;Decreased activity tolerance;Decreased knowledge of precautions;Pain Barriers to Discharge: None PT Therapy Diagnosis : Difficulty walking;Acute pain PT Plan PT Frequency: 7X/week PT Treatment/Interventions: DME instruction;Gait training;Functional mobility training;Stair training;Therapeutic activities;Therapeutic exercise;Patient/family education PT Recommendation Recommendations for Other Services: OT consult Follow Up Recommendations: Home health PT Equipment Recommended: Rolling walker with 5" wheels PT Goals  Acute Rehab PT Goals PT Goal Formulation: With patient Time For Goal Achievement: 5 days Pt will go Supine/Side to Sit: with modified independence PT Goal: Supine/Side to Sit - Progress: Not met Pt will go Sit to Stand: with modified independence PT Goal: Sit to Stand - Progress: Not met Pt will go Stand to Sit: with modified independence PT Goal: Stand to Sit - Progress: Not met Pt will Ambulate: >150 feet;with modified independence;with rolling walker PT Goal: Ambulate - Progress: Not met Pt will Perform Home Exercise Program: with min  assist PT Goal: Perform Home Exercise Program - Progress: Not met Additional Goals Additional Goal #1: pt will verbalize 3 of 3 posterior THA precautions correctly  PT Evaluation Precautions/Restrictions  Precautions Precautions: Posterior Hip Precaution Booklet Issued: Yes (comment) Precaution Comments: instructed pt in posterior hip precautions Required Braces or Orthoses: No Restrictions Weight Bearing Restrictions: Yes LLE Weight Bearing: Partial weight bearing LLE Partial Weight Bearing Percentage or Pounds: 25-50% Prior Functioning  Home Living Lives With: Spouse (wife had back surgery recently, cannot physically assist pt) Receives Help From: Personal care attendant Type of Home: House Home Layout: One level Home Access: Level entry Home Adaptive Equipment: Bedside commode/3-in-1;Straight cane Prior Function Level of Independence: Independent with basic ADLs;Independent with homemaking with ambulation;Requires assistive device for independence (used straight cane for walking PTA) Vocation: Retired Financial risk analyst Arousal/Alertness: Awake/alert Overall Cognitive Status: Appears within functional limits for tasks assessed Orientation Level: Oriented X4 Sensation/Coordination Sensation Light Touch: Appears Intact Coordination Gross Motor Movements are Fluid and Coordinated: Yes Fine Motor Movements are Fluid and Coordinated: Yes Extremity Assessment RUE Assessment RUE Assessment: Within Functional Limits LUE Assessment LUE Assessment: Within Functional Limits RLE Assessment RLE Assessment: Within Functional Limits LLE Assessment LLE Assessment: Exceptions to [facility] LLE Strength Left Hip Flexion: 2/5 Left Hip ABduction: 2/5 Mobility (including Balance) Bed Mobility Bed Mobility: Yes Transfers Transfers: Yes Sit to Stand: 4: Min assist;From bed;From elevated surface;With upper extremity assist Sit to Stand Details (indicate cue type and reason): pt 75% Stand  to Sit: 4: Min assist;To chair/3-in-1;With armrests;With upper extremity assist Stand to Sit Details: min A to support LLE, VCs hand placement Ambulation/Gait Ambulation/Gait: Yes Ambulation/Gait Assistance: 4: Min assist Ambulation/Gait Assistance Details (indicate cue type and reason): min/guard assist for  safety/balance Ambulation Distance (Feet): 38 Feet Assistive device: Rolling walker Gait Pattern: Step-to pattern    Exercise  Total Joint Exercises Ankle Circles/Pumps: AROM;Both;10 reps;Supine Quad Sets: AROM;Both;10 reps;Supine Heel Slides: AAROM;Left;Supine;5 reps Hip ABduction/ADduction: AAROM;Left;5 reps;Supine Straight Leg Raises: Left;AROM;5 reps;Supine End of Session PT - End of Session Equipment Utilized During Treatment: Gait belt Activity Tolerance: Patient tolerated treatment well Patient left: in chair;with call bell in reach Nurse Communication: Mobility status for transfers;Mobility status for ambulation General Behavior During Session: [facility] for tasks performed Cognition: [facility] for tasks performed  Tamala Ser 08/01/2011, 12:21 PM Tamala Ser PT 08/01/2011  [phone]

## 2011-08-01 NOTE — Progress Notes (Signed)
Subjective: 1 Day Post-Op Procedure(s) (LRB): TOTAL HIP ARTHROPLASTY (Left) Patient reports pain as mild.   Patient seen in rounds with Dr. Lequita Halt. Patient has complaints of some pain and difficult night.  Encourage pain pills for better control. We will start therapy today. Plan is to go either home or possibly rehab at skilled facility after hospital stay.  Objective: Vital signs in last 24 hours: Temp:  [96.7 F (35.9 C)-98.5 F (36.9 C)] 98.4 F (36.9 C) (12/20 0540) Pulse Rate:  [51-75] 63  (12/20 0540) Resp:  [14-19] 18  (12/20 0540) BP: (117-147)/(50-74) 128/61 mmHg (12/20 0540) SpO2:  [95 %-100 %] 99 % (12/20 0540) Weight:  [80.74 kg (178 lb)] 178 lb (80.74 kg) (12/20 0152)  Intake/Output from previous day:  Intake/Output Summary (Last 24 hours) at 08/01/11 0930 Last data filed at 08/01/11 0646  Gross per 24 hour  Intake 3696.67 ml  Output   3195 ml  Net 501.67 ml    Intake/Output this shift: Excellent UOP last night and this morning.  Will keep watch to make sure doesn't overshoot and become dehydrated.  Labs:  Basename 08/01/11 0500  HGB 11.2*    Basename 08/01/11 0500  WBC 9.4  RBC 3.45*  HCT 32.2*  PLT 179    Basename 08/01/11 0500  NA 136  K 3.8  CL 98  CO2 25  BUN 24*  CREATININE 1.34  GLUCOSE 199*  CALCIUM 9.4   No results found for this basename: LABPT:2,INR:2 in the last 72 hours  Exam - Neurovascular intact Sensation intact distally Dressing - clean, dry Motor function intact - moving foot and toes well on exam.  Hemovac pulled without difficulty.  Assessment/Plan: 1 Day Post-Op Procedure(s) (LRB): TOTAL HIP ARTHROPLASTY (Left)  Past Medical History  Diagnosis Date  . Hypertension   . Arthritis     OA LEFT HIP-PLANS LEFT TOTAL HIP REPLACEMENT    Advance diet Up with therapy Discharge home with home health Discharge to SNF if not able to go home  DVT Prophylaxis - Xarelto  Protocol Partial-Weight Bearing 25-50% left  Leg D/C Knee Immobilizer Hemovac Pulled Begin Therapy Hip Preacutions Keep foley until tomorrow. No vaccines.  PERKINS, ALEXZANDREW 08/01/2011, 9:30 AM

## 2011-08-01 NOTE — Progress Notes (Signed)
Report called to [place], RN and pt transferred to room 1606 in stable condition.

## 2011-08-01 NOTE — Progress Notes (Signed)
Utilization review completed.  

## 2011-08-02 ENCOUNTER — Encounter (HOSPITAL_COMMUNITY): Payer: Self-pay | Admitting: Orthopedic Surgery

## 2011-08-02 LAB — CBC
HCT: 29.5 % — ABNORMAL LOW (ref 39.0–52.0)
Hemoglobin: 10.2 g/dL — ABNORMAL LOW (ref 13.0–17.0)
MCHC: 34.6 g/dL (ref 30.0–36.0)
RBC: 3.14 MIL/uL — ABNORMAL LOW (ref 4.22–5.81)
WBC: 13.2 10*3/uL — ABNORMAL HIGH (ref 4.0–10.5)

## 2011-08-02 LAB — BASIC METABOLIC PANEL
BUN: 24 mg/dL — ABNORMAL HIGH (ref 6–23)
Chloride: 104 mEq/L (ref 96–112)
GFR calc non Af Amer: 50 mL/min — ABNORMAL LOW (ref >90)
Glucose, Bld: 134 mg/dL — ABNORMAL HIGH (ref 70–99)
Potassium: 3.8 mEq/L (ref 3.5–5.1)
Sodium: 138 mEq/L (ref 135–145)

## 2011-08-02 MED ORDER — ALUM & MAG HYDROXIDE-SIMETH 200-200-20 MG/5ML PO SUSP
30.0000 mL | ORAL | Status: DC | PRN
  Administered 2011-08-02 – 2011-08-04 (×2): 30 mL via ORAL
  Filled 2011-08-02 (×2): qty 30

## 2011-08-02 MED ORDER — BISACODYL 10 MG RE SUPP: 10.0000 mg | Freq: Every day | RECTAL | Status: DC | PRN

## 2011-08-02 NOTE — Progress Notes (Signed)
08/02/11, Kathi Der RNC-MNN, BSN, [phone], CM received referral  and spoke with pt.  Pt has chosen [place] for home health services.  Debbie at [place] contacted for [facility] services to include [facility] PT and rolling walker with 5" wheels as ordered.  Pt also states that he will need transportation home as wife recently had back surgery.  Pt states that he does not have any other barriers to going home.  Asher Muir, CSW, spoke to pt and discussed transportation home via ambulance when pt is discharged and he is aware of this plan and desires to do this.  Will follow.

## 2011-08-02 NOTE — Progress Notes (Signed)
08/02/11, Kizer Nobbe, RNC-MNN-BSN, CM and CSW met with pt again to clarify transportation need upon d/c.  Pt states that he does not need ambulance for transportation home.  Pt states that son can assist with transportation home.

## 2011-08-02 NOTE — Progress Notes (Signed)
Physical Therapy Treatment Patient Details Name: XAVIER MUNGER MRN: [id number] DOB: 05-09-1933 Today's Date: 08/02/2011 1452 - 1512; GT PT Assessment/Plan  PT - Assessment/Plan PT Plan: Discharge plan remains appropriate PT Frequency: 7X/week Follow Up Recommendations: Home health PT Equipment Recommended: Rolling walker with 5" wheels PT Goals  Acute Rehab PT Goals Time For Goal Achievement: 5 days Pt will go Supine/Side to Sit: with modified independence Pt will go Sit to Stand: with modified independence PT Goal: Sit to Stand - Progress: Progressing toward goal Pt will go Stand to Sit: with modified independence PT Goal: Stand to Sit - Progress: Progressing toward goal Pt will Ambulate: >150 feet;with modified independence;with rolling walker PT Goal: Ambulate - Progress: Progressing toward goal  PT Treatment Precautions/Restrictions  Precautions Precautions: Posterior Hip Precaution Booklet Issued: Yes (comment) Precaution Comments: pt recalls all THP without cues Required Braces or Orthoses: No Restrictions Weight Bearing Restrictions: Yes LLE Weight Bearing: Partial weight bearing LLE Partial Weight Bearing Percentage or Pounds: 25-50% Mobility (including Balance) Bed Mobility Sit to Supine - Right: 4: Min assist Sit to Supine - Right Details (indicate cue type and reason): min assist wiith L LE management Transfers Sit to Stand: 6: Modified independent (Device/Increase time) Stand to Sit: 5: Supervision Stand to Sit Details: cues for L LE fwd Ambulation/Gait Ambulation/Gait Assistance: 5: Supervision;4: Min assist Ambulation/Gait Assistance Details (indicate cue type and reason): cues for posture and for position from RW required to maintain PWB Ambulation Distance (Feet): 200 Feet Assistive device: Rolling walker Gait Pattern: Step-to pattern    Exercise    End of Session PT - End of Session Activity Tolerance: Patient tolerated treatment well Patient  left: in bed;with call bell in reach General Behavior During Session: [facility] for tasks performed Cognition: [facility] for tasks performed  Marshel Golubski 08/02/2011, 4:38 PM

## 2011-08-02 NOTE — Progress Notes (Signed)
CSW met with pt to assist with D/C planning. Pt plans to d/c home with [facility] services.  RNCM alerted and will assist with D/C planning. FL2 was completed and pt faxed out in case plan changed and SNF was needed.

## 2011-08-02 NOTE — Progress Notes (Signed)
Physical Therapy Treatment Patient Details Name: Lance Martin MRN: [id number] DOB: 1933-01-03 Today's Date: 08/02/2011 11:10-11:35 G, TE  PT Assessment/Plan  PT - Assessment/Plan Comments on Treatment Session: PT progressing well with transfers and ambulation. Able to state 3 of 3 posterior hip precautions. Increased gait distance today. PT Plan: Discharge plan remains appropriate PT Frequency: 7X/week Follow Up Recommendations: Home health PT Equipment Recommended: Rolling walker with 5" wheels PT Goals  Acute Rehab PT Goals PT Goal Formulation: With patient Time For Goal Achievement: 5 days Pt will go Supine/Side to Sit: with modified independence Pt will go Sit to Stand: with modified independence PT Goal: Sit to Stand - Progress: Met Pt will go Stand to Sit: with modified independence PT Goal: Stand to Sit - Progress: Met Pt will Ambulate: >150 feet;with modified independence;with rolling walker PT Goal: Ambulate - Progress: Progressing toward goal Pt will Perform Home Exercise Program: with min assist PT Goal: Perform Home Exercise Program - Progress: Progressing toward goal Additional Goals Additional Goal #1: pt will verbalize 3 of 3 posterior THA precautions correctly PT Goal: Additional Goal #1 - Progress: Met  PT Treatment Precautions/Restrictions  Precautions Precautions: Posterior Hip Precaution Booklet Issued: Yes (comment) Precaution Comments: instructed pt in posterior hip precautions Required Braces or Orthoses: No Restrictions Weight Bearing Restrictions: Yes LLE Weight Bearing: Partial weight bearing LLE Partial Weight Bearing Percentage or Pounds: 25-50% Mobility (including Balance) Bed Mobility Bed Mobility: No Sitting - Scoot to Edge of Bed: 5: Supervision Sit to Supine - Right: 5: Supervision;With rail;HOB elevated (comment degrees) Transfers Transfers: Yes Sit to Stand: 6: Modified independent (Device/Increase time);From chair/3-in-1;With  armrests;With upper extremity assist Stand to Sit: 6: Modified independent (Device/Increase time);To chair/3-in-1;With armrests;With upper extremity assist Ambulation/Gait Ambulation/Gait: Yes Ambulation/Gait Assistance: 5: Supervision Ambulation/Gait Assistance Details (indicate cue type and reason): VCs for heel strike LLE, and to flex knee with L swing phase Ambulation Distance (Feet): 160 Feet Assistive device: Rolling walker Gait Pattern: Step-to pattern    Exercise  Total Joint Exercises Ankle Circles/Pumps: AROM;Both;10 reps;Supine Quad Sets: AROM;Both;10 reps;Supine Heel Slides: AAROM;Left;10 reps;Supine Hip ABduction/ADduction: AAROM;Left;10 reps;Supine General Exercises - Lower Extremity Short Arc Quad: AROM;Left;10 reps;Supine End of Session PT - End of Session Equipment Utilized During Treatment: Gait belt Activity Tolerance: Patient tolerated treatment well Patient left: in chair;with call bell in reach Nurse Communication: Mobility status for transfers;Mobility status for ambulation General Behavior During Session: [facility] for tasks performed Cognition: [facility] for tasks performed  Tamala Ser 08/02/2011, 11:41 AM Tamala Ser PT 08/02/2011  [phone]

## 2011-08-02 NOTE — Progress Notes (Signed)
Occupational Therapy Evaluation Patient Details Name: Lance Martin MRN: [id number] DOB: [birth year] Today's Date: 08/02/2011  Problem List: There is no problem list on file for this patient. [id number] [phone]  Past Medical History:  Past Medical History  Diagnosis Date  . Hypertension   . Arthritis     OA LEFT HIP-PLANS LEFT TOTAL HIP REPLACEMENT   Past Surgical History:  Past Surgical History  Procedure Date  . Left elbow surgery 2009    ELBOW AND WRIST SURGERY  . Eye surgery 2009    RIGHT CATARACT EXTRACTION  . Tonsillectomy     AGE 77    OT Assessment/Plan/Recommendation OT Assessment Clinical Impression Statement: Pt presents with new posterior LTHR. All education completed. Pt has all necessary DME/AE & prn A at home. OT Recommendation/Assessment: Patient does not need any further OT services OT Goals    OT Evaluation Precautions/Restrictions  Precautions Precautions: Posterior Hip Precaution Booklet Issued: Yes (comment) Precaution Comments: instructed pt in posterior hip precautions Required Braces or Orthoses: No Restrictions Weight Bearing Restrictions: Yes LLE Weight Bearing: Partial weight bearing LLE Partial Weight Bearing Percentage or Pounds:  (25-50) Prior Functioning Home Living Bathroom Shower/Tub: Walk-in shower Bathroom Toilet: Handicapped height (3:1 over top of toilet) Home Adaptive Equipment: Bedside commode/3-in-1;Built-in shower seat;Grab bars around toilet;Grab bars in shower;Long-handled shoehorn;Long-handled sponge;Reacher;Sock aid;Straight cane   ADL ADL Eating/Feeding: Performed;Independent Where Assessed - Eating/Feeding: Bed level Grooming: Simulated;Set up Where Assessed - Grooming: Standing at sink Upper Body Bathing: Simulated;Set up Where Assessed - Upper Body Bathing: Unsupported;Sitting, bed Lower Body Bathing: Simulated;Set up Where Assessed - Lower Body Bathing: Sit to stand from bed Upper Body Dressing: Simulated;Set  up Where Assessed - Upper Body Dressing: Sitting, bed;Unsupported Lower Body Dressing: Performed;Set up;Other (comment) (Educated pt in use of AE w/ good return demo) Where Assessed - Lower Body Dressing: Sit to stand from bed Toilet Transfer: Buyer, retail Method: Surveyor, minerals: Other (comment) (sim to chair w/ RW) Toileting - Clothing Manipulation: Simulated;Supervision/safety Where Assessed - Glass blower/designer Manipulation: Standing Toileting - Hygiene: Simulated;Supervision/safety Where Assessed - Toileting Hygiene: Standing Tub/Shower Transfer: Other (comment) (Educated pt how to safely step into walk in shower.) Tub/Shower Transfer Method: Other (comment) (provided educational handout on posterior transfer method) Equipment Used: Reacher;Rolling walker;Sock aid ADL Comments: Pt able to verbalize 3/3 hip precautions Vision/Perception  Vision - History Baseline Vision: Wears glasses all the time Patient Visual Report: No change from baseline Vision - Assessment Vision Assessment: Vision not tested [employer] Arousal/Alertness: Awake/alert Overall Cognitive Status: Appears within functional limits for tasks assessed Orientation Level: Oriented X4 Sensation/Coordination   Extremity Assessment RUE Assessment RUE Assessment: Within Functional Limits LUE Assessment LUE Assessment: Within Functional Limits Mobility  Bed Mobility Bed Mobility: Yes Sitting - Scoot to Edge of Bed: 5: Supervision Sit to Supine - Right: 5: Supervision;With rail;HOB elevated (comment degrees) Transfers Transfers: Yes Sit to Stand: 5: Supervision;From elevated surface;From bed;With upper extremity assist Stand to Sit: 5: Supervision;To chair/3-in-1;With upper extremity assist;With armrests Exercises   End of Session OT - End of Session Equipment Utilized During Treatment: Other (comment) (RW, reacher, sock aid) Activity Tolerance:  Patient tolerated treatment well Patient left: in chair;with call bell in reach General Behavior During Session: [facility] for tasks performed Cognition: [facility] for tasks performed   Kelisha Dall A [phone] 08/02/2011, 10:05 AM

## 2011-08-02 NOTE — Progress Notes (Signed)
Subjective: 2 Days Post-Op Procedure(s) (LRB): TOTAL HIP ARTHROPLASTY (Left) Patient reports pain as mild.   Patient seen in rounds with Dr. Lequita Halt. Patient has complaints of constipation.  Will try suppository this AM.  Continue to work with therapy and plan for home tomorrow.  Objective: Vital signs in last 24 hours: Temp:  [97.9 F (36.6 C)-98.5 F (36.9 C)] 98.5 F (36.9 C) (12/21 0600) Pulse Rate:  [52-69] 69  (12/21 0600) Resp:  [16-18] 16  (12/21 0600) BP: (115-131)/(53-61) 129/56 mmHg (12/21 0600) SpO2:  [95 %-99 %] 95 % (12/21 0600)  Intake/Output from previous day:  Intake/Output Summary (Last 24 hours) at 08/02/11 0902 Last data filed at 08/02/11 0600  Gross per 24 hour  Intake   2210 ml  Output   2550 ml  Net   -340 ml    Intake/Output this shift:    Labs:  Basename 08/02/11 0440 08/01/11 0500  HGB 10.2* 11.2*    Basename 08/02/11 0440 08/01/11 0500  WBC 13.2* 9.4  RBC 3.14* 3.45*  HCT 29.5* 32.2*  PLT 169 179    Basename 08/02/11 0440 08/01/11 0500  NA 138 136  K 3.8 3.8  CL 104 98  CO2 24 25  BUN 24* 24*  CREATININE 1.34 1.34  GLUCOSE 134* 199*  CALCIUM 8.9 9.4   No results found for this basename: LABPT:2,INR:2 in the last 72 hours  Exam - Neurovascular intact Sensation intact distally Dressing/Incision - clean, dry, no active drainage but some bloody drainage on dressing. Motor function intact - moving foot and toes well on exam.   Assessment/Plan: 2 Days Post-Op Procedure(s) (LRB): TOTAL HIP ARTHROPLASTY (Left)  Past Medical History  Diagnosis Date  . Hypertension   . Arthritis     OA LEFT HIP-PLANS LEFT TOTAL HIP REPLACEMENT    Advance diet Up with therapy Plan for discharge tomorrow Discharge home with home health  DVT Prophylaxis - Xarelto  Protocol Partial-Weight Bearing 25-50% left Leg  Lance Martin 08/02/2011, 9:02 AM

## 2011-08-03 LAB — CBC
HCT: 29.9 % — ABNORMAL LOW (ref 39.0–52.0)
Hemoglobin: 10.2 g/dL — ABNORMAL LOW (ref 13.0–17.0)
MCH: 32.9 pg (ref 26.0–34.0)
MCHC: 34.1 g/dL (ref 30.0–36.0)
RBC: 3.1 MIL/uL — ABNORMAL LOW (ref 4.22–5.81)

## 2011-08-03 MED ORDER — MORPHINE SULFATE 2 MG/ML IJ SOLN
1.0000 mg | Freq: Once | INTRAMUSCULAR | Status: AC
  Administered 2011-08-03: 1 mg via INTRAMUSCULAR

## 2011-08-03 MED ORDER — OXYCODONE HCL 5 MG PO TABS: 5.0000 mg | ORAL_TABLET | ORAL | Status: DC | PRN

## 2011-08-03 MED ORDER — COLCHICINE 0.6 MG PO TABS
0.6000 mg | ORAL_TABLET | Freq: Once | ORAL | Status: AC
  Administered 2011-08-03: 0.6 mg via ORAL
  Filled 2011-08-03: qty 1

## 2011-08-03 MED ORDER — METHOCARBAMOL 500 MG PO TABS: 500.0000 mg | ORAL_TABLET | Freq: Four times a day (QID) | ORAL | Status: DC | PRN

## 2011-08-03 NOTE — Progress Notes (Signed)
Pt unsure if able to "self-care" with [facility] services. CM suggested HHA/HHRN added to HHPT order from MD. Pt given list of private duty agencies to assist with 24 hour care of him and spouse. Wil return 12/23 to speak with pt cncerning d/c planning.

## 2011-08-03 NOTE — Discharge Summary (Signed)
Physician Discharge Summary  Patient ID: Lance Martin MRN: [id number] DOB/AGE: [id number] 75 y.o.  Admit date: 07/31/2011 Discharge date: 08/03/2011  Admission Diagnoses:  Left hip osteoarthritis  Discharge Diagnoses:  Same   Surgeries: Procedure(s): TOTAL HIP ARTHROPLASTY on 07/31/2011   Consultants: Physcial and occupational therapy  Discharged Condition: Stable  Hospital Course: Lance Martin is an 77 y.o. male who was admitted 07/31/2011 with a chief complaint of No chief complaint on file. , and found to have a diagnosis of <principal problem not specified>.  They were brought to the operating room on 07/31/2011 and underwent the above named procedures.    The patient had an uncomplicated hospital course and was stable for discharge.  Recent vital signs:  Filed Vitals:   08/03/11 0534  BP: 115/58  Pulse: 55  Temp: 99.2 F (37.3 C)  Resp: 18    Recent laboratory studies:  Results for orders placed during the hospital encounter of 07/31/11  TYPE AND SCREEN      Component Value Range   ABO/RH(D) O POS     Antibody Screen NEG     Sample Expiration 08/03/2011    ABO/RH      Component Value Range   ABO/RH(D) O POS    CBC      Component Value Range   WBC 9.4  4.0 - 10.5 (K/uL)   RBC 3.45 (*) 4.22 - 5.81 (MIL/uL)   Hemoglobin 11.2 (*) 13.0 - 17.0 (g/dL)   HCT 78.2 (*) 95.6 - 52.0 (%)   MCV 93.3  78.0 - 100.0 (fL)   MCH 32.5  26.0 - 34.0 (pg)   MCHC 34.8  30.0 - 36.0 (g/dL)   RDW 21.3  08.6 - 57.8 (%)   Platelets 179  150 - 400 (K/uL)  BASIC METABOLIC PANEL      Component Value Range   Sodium 136  135 - 145 (mEq/L)   Potassium 3.8  3.5 - 5.1 (mEq/L)   Chloride 98  96 - 112 (mEq/L)   CO2 25  19 - 32 (mEq/L)   Glucose, Bld 199 (*) 70 - 99 (mg/dL)   BUN 24 (*) 6 - 23 (mg/dL)   Creatinine, Ser 4.69  0.50 - 1.35 (mg/dL)   Calcium 9.4  8.4 - 62.9 (mg/dL)   GFR calc non Af Amer 50 (*) >90 (mL/min)   GFR calc Af Amer 58 (*) >90 (mL/min)  CBC      Component  Value Range   WBC 13.2 (*) 4.0 - 10.5 (K/uL)   RBC 3.14 (*) 4.22 - 5.81 (MIL/uL)   Hemoglobin 10.2 (*) 13.0 - 17.0 (g/dL)   HCT 52.8 (*) 41.3 - 52.0 (%)   MCV 93.9  78.0 - 100.0 (fL)   MCH 32.5  26.0 - 34.0 (pg)   MCHC 34.6  30.0 - 36.0 (g/dL)   RDW 24.4  01.0 - 27.2 (%)   Platelets 169  150 - 400 (K/uL)  BASIC METABOLIC PANEL      Component Value Range   Sodium 138  135 - 145 (mEq/L)   Potassium 3.8  3.5 - 5.1 (mEq/L)   Chloride 104  96 - 112 (mEq/L)   CO2 24  19 - 32 (mEq/L)   Glucose, Bld 134 (*) 70 - 99 (mg/dL)   BUN 24 (*) 6 - 23 (mg/dL)   Creatinine, Ser 5.36  0.50 - 1.35 (mg/dL)   Calcium 8.9  8.4 - 64.4 (mg/dL)   GFR calc non Af Amer 50 (*) >  90 (mL/min)   GFR calc Af Amer 58 (*) >90 (mL/min)  CBC      Component Value Range   WBC 10.8 (*) 4.0 - 10.5 (K/uL)   RBC 3.10 (*) 4.22 - 5.81 (MIL/uL)   Hemoglobin 10.2 (*) 13.0 - 17.0 (g/dL)   HCT 16.1 (*) 09.6 - 52.0 (%)   MCV 96.5  78.0 - 100.0 (fL)   MCH 32.9  26.0 - 34.0 (pg)   MCHC 34.1  30.0 - 36.0 (g/dL)   RDW 04.5  40.9 - 81.1 (%)   Platelets 191  150 - 400 (K/uL)    Discharge Medications:   Current Discharge Medication List    START taking these medications   Details  methocarbamol (ROBAXIN) 500 MG tablet Take 1 tablet (500 mg total) by mouth every 6 (six) hours as needed. Qty: 60 tablet, Refills: 1    oxyCODONE (OXY IR/ROXICODONE) 5 MG immediate release tablet Take 1-2 tablets (5-10 mg total) by mouth every 3 (three) hours as needed. Qty: 30 tablet, Refills: 0      CONTINUE these medications which have NOT CHANGED   Details  amLODipine (NORVASC) 5 MG tablet Take 5 mg by mouth every evening.     aspirin EC 81 MG tablet Take 81 mg by mouth 2 (two) times daily.     atorvastatin (LIPITOR) 20 MG tablet Take 20 mg by mouth at bedtime.     b complex vitamins tablet Take 1 tablet by mouth daily.     benazepril (LOTENSIN) 40 MG tablet Take 40 mg by mouth at bedtime.     Calcium Citrate-Vitamin D (CALCIUM  CITRATE + D PO) Take 1 tablet by mouth daily.     Coenzyme Q10 (CO Q 10 PO) Take 1 tablet by mouth daily.     fenofibrate (TRICOR) 48 MG tablet Take 48 mg by mouth every evening.     fish oil-omega-3 fatty acids 1000 MG capsule Take 2 g by mouth daily.     Flaxseed, Linseed, (FLAX SEED OIL PO) Take by mouth 2 (two) times daily.     furosemide (LASIX) 20 MG tablet Take 20 mg by mouth every other day. IN THE AM    GLUCOSAMINE HCL PO Take 1,500 mg by mouth daily.     Multiple Vitamins-Minerals (MULTIVITAMINS THER. W/MINERALS) TABS Take 1 tablet by mouth daily.     naproxen sodium (ANAPROX) 220 MG tablet Take 440 mg by mouth 2 (two) times daily.     Nebivolol HCl (BYSTOLIC) 20 MG TABS Take 1 tablet by mouth at bedtime.     SULFINPYRAZONE PO Take 200 mg by mouth every morning.     VITAMIN D, CHOLECALCIFEROL, PO Take 1 tablet by mouth daily.     cetirizine (ZYRTEC) 10 MG tablet Take 10 mg by mouth at bedtime.     fluticasone (FLONASE) 50 MCG/ACT nasal spray Place 2 sprays into the nose daily as needed. ALLERGIES     potassium chloride (KLOR-CON) 10 MEQ CR tablet Take 10 mEq by mouth. THREE TIMES A WEEK   -POTASSIUM CHLORIDE ER 10 MEQ        Diagnostic Studies: Dg Chest 2 View  07/26/2011  *RADIOLOGY REPORT*  Clinical Data: Preoperative respiratory films.  Patient for hip replacement.  CHEST - 2 VIEW  Comparison: None.  Findings: The lungs are clear.  Heart size is normal.  No pneumothorax or pleural effusion.  IMPRESSION: No acute disease.  Original Report Authenticated By: Bernadene Bell. Maricela Curet, M.D.  X-ray Hip Left Ap And Lateral  07/26/2011  *RADIOLOGY REPORT*  Clinical Data: Patient the left hip replacement.  Preoperative films.  LEFT HIP - COMPLETE 2+ VIEW  Comparison: None.  Findings: The patient has severe left hip degenerative disease with bone-on-bone joint space narrowing.  Mild to moderate right hip osteoarthritis is noted.  There is no fracture.  IMPRESSION: Severe left  hip degenerative disease.  Original Report Authenticated By: Bernadene Bell. Maricela Curet, M.D.   Dg Pelvis Portable  07/31/2011  *RADIOLOGY REPORT*  Clinical Data: Postop hip surgery  PORTABLE PELVIS  Comparison: 07/26/2011  Findings: Left hip replacement in satisfactory position and alignment.  Prosthesis is in good position.  No fracture or acute complication.  IMPRESSION: Satisfactory left hip replacement.  Original Report Authenticated By: Camelia Phenes, M.D.    Disposition: Final discharge disposition not confirmed  Discharge Orders    Future Orders Please Complete By Expires   Diet - low sodium heart healthy      Call MD / Call 911      Comments:   If you experience chest pain or shortness of breath, CALL 911 and be transported to the hospital emergency room.  If you develope a fever above 101 F, pus (white drainage) or increased drainage or redness at the wound, or calf pain, call your surgeon's office.   Constipation Prevention      Comments:   Drink plenty of fluids.  Prune juice may be helpful.  You may use a stool softener, such as Colace (over the counter) 100 mg twice a day.  Use MiraLax (over the counter) for constipation as needed.   Increase activity slowly as tolerated      Weight Bearing as taught in Physical Therapy      Comments:   Use a walker or crutches as instructed.   Follow the hip precautions as taught in Physical Therapy      Discharge instructions      Comments:   Follow the hip precautions and follow up with [facility] in 2 weeks         Signed: Zacari Radick B 08/03/2011, 8:09 AM

## 2011-08-03 NOTE — Plan of Care (Signed)
Problem: Phase I Progression Outcomes Goal: Initial discharge plan identified Outcome: Progressing Patient with physician is planning for discharge. With HH/ PT/ OT/ to assist with rehab at home.

## 2011-08-03 NOTE — Progress Notes (Signed)
Physical Therapy Treatment Patient Details Name: Lance Martin MRN: [id number] DOB: 13-Dec-1932 Today's Date: 08/03/2011  PT Assessment/Plan  PT - Assessment/Plan Comments on Treatment Session: pt reports he was ambulating in room earlier and states it was very difficult due to gout  pain.  Pt states wife is on RW after back surgery.  she does have a caregiver several hours per day. Disussed with pt possibility of not DCing today due to circumstances at home and gout flare. RN is aware of situation.  Pt sated, "Maybe I should have gone to rehab".  will see how pt is doing later. PT Goals     PT Treatment Precautions/Restrictions  Precautions Precautions: Posterior Hip Precaution Booklet Issued: Yes (comment) Precaution Comments: pt recalls all THP without cues Required Braces or Orthoses: No Restrictions Weight Bearing Restrictions: Yes LLE Weight Bearing: Partial weight bearing LLE Partial Weight Bearing Percentage or Pounds: 25-50% Mobility (including Balance)      Exercise    End of Session    Rada Hay 08/03/2011, 12:08 PM

## 2011-08-03 NOTE — Progress Notes (Signed)
Orthopedics Progress Note  Subjective: I doing well except I started having some soreness at 5 this morning. Otherwise I am ready for discharge home  Objective:  Filed Vitals:   08/03/11 0534  BP: 115/58  Pulse: 55  Temp: 99.2 F (37.3 C)  Resp: 18    General: Awake and alert  Musculoskeletal: Left hip incision healing well, nv intact distally, no drainage Neurovascularly intact  Lab Results  Component Value Date   WBC 10.8* 08/03/2011   HGB 10.2* 08/03/2011   HCT 29.9* 08/03/2011   MCV 96.5 08/03/2011   PLT 191 08/03/2011       Component Value Date/Time   NA 138 08/02/2011 0440   K 3.8 08/02/2011 0440   CL 104 08/02/2011 0440   CO2 24 08/02/2011 0440   GLUCOSE 134* 08/02/2011 0440   BUN 24* 08/02/2011 0440   CREATININE 1.34 08/02/2011 0440   CALCIUM 8.9 08/02/2011 0440   GFRNONAA 50* 08/02/2011 0440   GFRAA 58* 08/02/2011 0440    Lab Results  Component Value Date   INR 1.07 07/26/2011    Assessment/Plan: POD #3 s/p Procedure(s): TOTAL HIP ARTHROPLASTY  Plan: d/c home today. F/u in 2 weeks   Almedia Balls. Ranell Patrick, MD 08/03/2011 8:01 AM

## 2011-08-03 NOTE — Plan of Care (Signed)
Problem: Phase III Progression Outcomes Goal: IV/normal saline lock discontinued Outcome: Completed/Met Date Met:  08/03/11 Removed IV site today- receiving all oral medications.

## 2011-08-03 NOTE — Progress Notes (Addendum)
Cm spoke with pt concerning d/c planning. Patient is a retired Development worker, community. Request 3N1 and RW. DME and HHPT provided by [facility] per pt choice. [facility] contacted concerning referral. DME scheduled delivery to room prior to pt d/c today.  Leonie Green Weekend Case Manager [phone]

## 2011-08-04 MED ORDER — RIVAROXABAN 10 MG PO TABS: 10.0000 mg | ORAL_TABLET | Freq: Every day | ORAL | Status: DC

## 2011-08-04 NOTE — Progress Notes (Signed)
Subjective: 4 Days Post-Op Procedure(s) (LRB): TOTAL HIP ARTHROPLASTY (Left) Patient reports pain as mild.  No complaints wanting to go home  Objective: Vital signs in last 24 hours: Temp:  [99 F (37.2 C)-99.5 F (37.5 C)] 99.1 F (37.3 C) (12/23 0637) Pulse Rate:  [58-65] 59  (12/23 0637) Resp:  [18] 18  (12/23 0637) BP: (116-134)/(53-69) 129/69 mmHg (12/23 0637) SpO2:  [99 %] 99 % (12/23 0637)  Intake/Output from previous day: 12/22 0701 - 12/23 0700 In: 360 [P.O.:360] Out: 800 [Urine:800] Intake/Output this shift: Total I/O In: -  Out: 350 [Urine:350]   Basename 08/03/11 0433 08/02/11 0440  HGB 10.2* 10.2*    Basename 08/03/11 0433 08/02/11 0440  WBC 10.8* 13.2*  RBC 3.10* 3.14*  HCT 29.9* 29.5*  PLT 191 169    Basename 08/02/11 0440  NA 138  K 3.8  CL 104  CO2 24  BUN 24*  CREATININE 1.34  GLUCOSE 134*  CALCIUM 8.9   No results found for this basename: LABPT:2,INR:2 in the last 72 hours  Neurovascular intact Compartment soft Wound benign Assessment/Plan: 4 Days Post-Op Procedure(s) (LRB): TOTAL HIP ARTHROPLASTY (Left) Discharge home with home health  CLARK,GILBERT W. 08/04/2011, 9:39 AM

## 2011-08-04 NOTE — Progress Notes (Signed)
Patient stable; discharged home (wife and sister waiting at front entrance).  Transported via wheelchair to private vehicle by NT.

## 2011-08-04 NOTE — Progress Notes (Signed)
Physical Therapy Treatment Patient Details Name: Lance Martin MRN: [id number] DOB: [birth year] Today's Date: 08/04/2011 8:30-9:00 G, TE  PT Assessment/Plan  PT - Assessment/Plan Comments on Treatment Session: Pt notes decreased gout pain B knees today. Able to verbalize 3/3 hip precautions. REady to DC home from PT standpoint.  Has needed DME. Will need HHPT. Deferred knee flexion exercises due to increased pain.  PT Plan: Discharge plan remains appropriate PT Frequency: 7X/week Follow Up Recommendations: Home health PT Equipment Recommended: Rolling walker with 5" wheels PT Goals  Acute Rehab PT Goals PT Goal Formulation: With patient Time For Goal Achievement: 5 days Pt will go Supine/Side to Sit: with modified independence PT Goal: Supine/Side to Sit - Progress: Progressing toward goal Pt will go Sit to Stand: with modified independence PT Goal: Sit to Stand - Progress: Met Pt will go Stand to Sit: with modified independence PT Goal: Stand to Sit - Progress: Progressing toward goal Pt will Ambulate: >150 feet;with modified independence;with rolling walker PT Goal: Ambulate - Progress: Progressing toward goal Pt will Perform Home Exercise Program: with min assist PT Goal: Perform Home Exercise Program - Progress: Met  PT Treatment Precautions/Restrictions  Precautions Precautions: Posterior Hip Precaution Booklet Issued: Yes (comment) Precaution Comments: pt recalls all THP without cues Required Braces or Orthoses: No Restrictions Weight Bearing Restrictions: Yes LLE Weight Bearing: Partial weight bearing LLE Partial Weight Bearing Percentage or Pounds: 25-50% Mobility (including Balance) Bed Mobility Bed Mobility: Yes Supine to Sit: 4: Min assist Supine to Sit Details (indicate cue type and reason): min A to support LLE, pt 80% Sitting - Scoot to Edge of Bed: 5: Supervision Transfers Transfers: Yes Sit to Stand: 6: Modified independent (Device/Increase time);From  bed;From elevated surface Stand to Sit: 5: Supervision;To chair/3-in-1;With upper extremity assist;With armrests Stand to Sit Details: VCs for LLE positioning Ambulation/Gait Ambulation/Gait: Yes Ambulation/Gait Assistance: 6: Modified independent (Device/Increase time) Ambulation Distance (Feet): 110 Feet Assistive device: Rolling walker Gait Pattern: Step-to pattern    Exercise  Total Joint Exercises Ankle Circles/Pumps: AROM;Both;10 reps;Supine Hip ABduction/ADduction: 10 reps;AROM;Standing;Left Standing Hip Extension: AROM;Left;10 reps;Standing End of Session PT - End of Session Equipment Utilized During Treatment: Gait belt Activity Tolerance: Patient tolerated treatment well Patient left: in chair;with call bell in reach Nurse Communication: Mobility status for transfers;Mobility status for ambulation General Behavior During Session: [facility] for tasks performed Cognition: [facility] for tasks performed  Tamala Ser 08/04/2011, 9:21 AM  Tamala Ser PT 08/04/2011  [phone]

## 2011-08-04 NOTE — Discharge Summary (Signed)
Patient Stable gout flare resolved . Patient ready for discharge to home with home health. Physical exam as noted in progress note.

## 2011-08-05 DIAGNOSIS — Z7901 Long term (current) use of anticoagulants: Secondary | ICD-10-CM | POA: Diagnosis not present

## 2011-08-05 DIAGNOSIS — Z471 Aftercare following joint replacement surgery: Secondary | ICD-10-CM | POA: Diagnosis not present

## 2011-08-05 DIAGNOSIS — IMO0001 Reserved for inherently not codable concepts without codable children: Secondary | ICD-10-CM | POA: Diagnosis not present

## 2011-08-05 DIAGNOSIS — Z96649 Presence of unspecified artificial hip joint: Secondary | ICD-10-CM | POA: Diagnosis not present

## 2011-08-05 DIAGNOSIS — I10 Essential (primary) hypertension: Secondary | ICD-10-CM | POA: Diagnosis not present

## 2011-08-08 DIAGNOSIS — Z471 Aftercare following joint replacement surgery: Secondary | ICD-10-CM | POA: Diagnosis not present

## 2011-08-08 DIAGNOSIS — Z96649 Presence of unspecified artificial hip joint: Secondary | ICD-10-CM | POA: Diagnosis not present

## 2011-08-08 DIAGNOSIS — IMO0001 Reserved for inherently not codable concepts without codable children: Secondary | ICD-10-CM | POA: Diagnosis not present

## 2011-08-08 DIAGNOSIS — Z7901 Long term (current) use of anticoagulants: Secondary | ICD-10-CM | POA: Diagnosis not present

## 2011-08-08 DIAGNOSIS — I10 Essential (primary) hypertension: Secondary | ICD-10-CM | POA: Diagnosis not present

## 2011-08-09 DIAGNOSIS — Z96649 Presence of unspecified artificial hip joint: Secondary | ICD-10-CM | POA: Diagnosis not present

## 2011-08-09 DIAGNOSIS — IMO0001 Reserved for inherently not codable concepts without codable children: Secondary | ICD-10-CM | POA: Diagnosis not present

## 2011-08-09 DIAGNOSIS — I10 Essential (primary) hypertension: Secondary | ICD-10-CM | POA: Diagnosis not present

## 2011-08-09 DIAGNOSIS — Z471 Aftercare following joint replacement surgery: Secondary | ICD-10-CM | POA: Diagnosis not present

## 2011-08-09 DIAGNOSIS — Z7901 Long term (current) use of anticoagulants: Secondary | ICD-10-CM | POA: Diagnosis not present

## 2011-08-12 DIAGNOSIS — IMO0001 Reserved for inherently not codable concepts without codable children: Secondary | ICD-10-CM | POA: Diagnosis not present

## 2011-08-12 DIAGNOSIS — Z471 Aftercare following joint replacement surgery: Secondary | ICD-10-CM | POA: Diagnosis not present

## 2011-08-12 DIAGNOSIS — Z96649 Presence of unspecified artificial hip joint: Secondary | ICD-10-CM | POA: Diagnosis not present

## 2011-08-12 DIAGNOSIS — Z7901 Long term (current) use of anticoagulants: Secondary | ICD-10-CM | POA: Diagnosis not present

## 2011-08-12 DIAGNOSIS — I10 Essential (primary) hypertension: Secondary | ICD-10-CM | POA: Diagnosis not present

## 2011-08-14 ENCOUNTER — Emergency Department (HOSPITAL_COMMUNITY): Payer: Medicare Other

## 2011-08-14 ENCOUNTER — Emergency Department (HOSPITAL_COMMUNITY)
Admission: EM | Admit: 2011-08-14 | Discharge: 2011-08-14 | Disposition: A | Discharge status: Home / Self Care | Payer: Medicare Other | Attending: Emergency Medicine | Admitting: Emergency Medicine

## 2011-08-14 ENCOUNTER — Encounter (HOSPITAL_COMMUNITY): Payer: Self-pay | Admitting: *Deleted

## 2011-08-14 DIAGNOSIS — Z7901 Long term (current) use of anticoagulants: Secondary | ICD-10-CM | POA: Insufficient documentation

## 2011-08-14 DIAGNOSIS — Z96649 Presence of unspecified artificial hip joint: Secondary | ICD-10-CM | POA: Insufficient documentation

## 2011-08-14 DIAGNOSIS — IMO0001 Reserved for inherently not codable concepts without codable children: Secondary | ICD-10-CM | POA: Diagnosis not present

## 2011-08-14 DIAGNOSIS — R4701 Aphasia: Secondary | ICD-10-CM

## 2011-08-14 DIAGNOSIS — I6200 Nontraumatic subdural hemorrhage, unspecified: Secondary | ICD-10-CM | POA: Diagnosis not present

## 2011-08-14 DIAGNOSIS — Z471 Aftercare following joint replacement surgery: Secondary | ICD-10-CM | POA: Diagnosis not present

## 2011-08-14 DIAGNOSIS — S065X9A Traumatic subdural hemorrhage with loss of consciousness of unspecified duration, initial encounter: Secondary | ICD-10-CM

## 2011-08-14 DIAGNOSIS — J984 Other disorders of lung: Secondary | ICD-10-CM | POA: Diagnosis not present

## 2011-08-14 DIAGNOSIS — I10 Essential (primary) hypertension: Secondary | ICD-10-CM | POA: Insufficient documentation

## 2011-08-14 LAB — URINALYSIS, ROUTINE W REFLEX MICROSCOPIC
Bilirubin Urine: NEGATIVE
Hgb urine dipstick: NEGATIVE
Nitrite: NEGATIVE
Specific Gravity, Urine: 1.016 (ref 1.005–1.030)
Urobilinogen, UA: 0.2 mg/dL (ref 0.0–1.0)
pH: 5.5 (ref 5.0–8.0)

## 2011-08-14 LAB — CBC
HCT: 29.8 % — ABNORMAL LOW (ref 39.0–52.0)
Platelets: 454 10*3/uL — ABNORMAL HIGH (ref 150–400)
RDW: 12.8 % (ref 11.5–15.5)
WBC: 9.3 10*3/uL (ref 4.0–10.5)

## 2011-08-14 LAB — DIFFERENTIAL
Basophils Absolute: 0 10*3/uL (ref 0.0–0.1)
Basophils Relative: 0 % (ref 0–1)
Lymphocytes Relative: 25 % (ref 12–46)
Neutro Abs: 6 10*3/uL (ref 1.7–7.7)
Neutrophils Relative %: 64 % (ref 43–77)

## 2011-08-14 LAB — COMPREHENSIVE METABOLIC PANEL
ALT: 16 U/L (ref 0–53)
AST: 20 U/L (ref 0–37)
Albumin: 3.5 g/dL (ref 3.5–5.2)
CO2: 27 mEq/L (ref 19–32)
Chloride: 100 mEq/L (ref 96–112)
GFR calc non Af Amer: 36 mL/min — ABNORMAL LOW (ref >90)
Potassium: 4.1 mEq/L (ref 3.5–5.1)
Sodium: 137 mEq/L (ref 135–145)
Total Bilirubin: 0.6 mg/dL (ref 0.3–1.2)

## 2011-08-14 NOTE — ED Provider Notes (Signed)
History     CSN: [id number]  Arrival date & time 08/14/11  1526   First MD Initiated Contact with Patient 08/14/11 1945      Chief Complaint  Patient presents with  . Aphasia    intermittent x last 3-4 days.  clear, coherent speech aat.     (Consider location/radiation/quality/duration/timing/severity/associated sxs/prior treatment) The history is provided by the patient.   patient states she's been having episodes of aphasia intermittently for the last 2 weeks. It began after he had a total hip replacement. He started on blood thinners or aspirin and xarelto. They come and go. It lasts about 5 minutes. It is not associated with a headache. It is not associated with numbness or weakness. Upon further questioning he states that he did fall about a month ago, about 2 weeks before the surgery. He hit his head at that time. He had some headaches since then but not recently. He states that his primary care doctor's office in the ER for evaluation. He is worried about possible TIAs.   Past Medical History  Diagnosis Date  . Hypertension   . Arthritis     OA LEFT HIP-PLANS LEFT TOTAL HIP REPLACEMENT    Past Surgical History  Procedure Date  . Left elbow surgery 2009    ELBOW AND WRIST SURGERY  . Eye surgery 2009    RIGHT CATARACT EXTRACTION  . Tonsillectomy     AGE 78  . Total hip arthroplasty 07/31/2011    Procedure: TOTAL HIP ARTHROPLASTY;  Surgeon: Gus Rankin Aluisio;  Location: [facility] ORS;  Service: Orthopedics;  Laterality: Left;  . Total hip arthroplasty     No family history on file.  History  Substance Use Topics  . Smoking status: Never Smoker   . Smokeless tobacco: Never Used  . Alcohol Use: 21.0 oz/week    7 Glasses of wine, 28 Shots of liquor per week      Review of Systems  Constitutional: Negative for activity change and appetite change.  HENT: Negative for neck stiffness.   Eyes: Negative for pain.  Respiratory: Negative for chest tightness and shortness of  breath.   Cardiovascular: Negative for chest pain and leg swelling.  Gastrointestinal: Negative for nausea, vomiting, abdominal pain and diarrhea.  Genitourinary: Negative for flank pain.  Musculoskeletal: Negative for back pain.  Skin: Negative for rash.  Neurological: Negative for weakness, numbness and headaches.       Aphasia  Psychiatric/Behavioral: Negative for behavioral problems.    Allergies  Allopurinol  Home Medications   Current Outpatient Rx  Name Route Sig Dispense Refill  . AMLODIPINE BESYLATE 5 MG PO TABS Oral Take 5 mg by mouth every evening.     . ATORVASTATIN CALCIUM 20 MG PO TABS Oral Take 20 mg by mouth at bedtime.     . B COMPLEX PO TABS Oral Take 1 tablet by mouth daily.     Marland Kitchen BENAZEPRIL HCL 40 MG PO TABS Oral Take 40 mg by mouth at bedtime.     Marland Kitchen CALCIUM CITRATE + D PO Oral Take 1 tablet by mouth daily.     Marland Kitchen CETIRIZINE HCL 10 MG PO TABS Oral Take 10 mg by mouth at bedtime.     . CO Q 10 PO Oral Take 1 tablet by mouth daily.     . FENOFIBRATE 48 MG PO TABS Oral Take 48 mg by mouth every evening.     Marland Kitchen OMEGA-3 FATTY ACIDS 1000 MG PO CAPS Oral Take 1  g by mouth 2 (two) times daily.     Marland Kitchen FLAX SEED OIL PO Oral Take 1 capsule by mouth 2 (two) times daily.     Marland Kitchen FLUTICASONE PROPIONATE 50 MCG/ACT NA SUSP Nasal Place 2 sprays into the nose daily as needed. ALLERGIES     . FUROSEMIDE 20 MG PO TABS Oral Take 20 mg by mouth every other day. IN THE AM    . GLUCOSAMINE HCL PO Oral Take 1,500 mg by mouth daily.     Carma Leaven M PLUS PO TABS Oral Take 1 tablet by mouth daily.     Marland Kitchen NAPROXEN SODIUM 220 MG PO TABS Oral Take 220 mg by mouth 2 (two) times daily.     . NEBIVOLOL HCL 20 MG PO TABS Oral Take 1 tablet by mouth at bedtime.     Marland Kitchen POTASSIUM CHLORIDE 10 MEQ PO TBCR Oral Take 10 mEq by mouth 3 (three) times a week.     . SULFINPYRAZONE PO Oral Take 200 mg by mouth every morning.     Marland Kitchen VITAMIN D (CHOLECALCIFEROL) PO Oral Take 1 tablet by mouth daily.       BP 134/53   Pulse 52  Temp(Src) 98.2 F (36.8 C) (Oral)  Resp 16  Ht 5\' 10"  (1.778 m)  Wt 172 lb (78.019 kg)  BMI 24.68 kg/m2  SpO2 100%  Physical Exam  Nursing note and vitals reviewed. Constitutional: He is oriented to person, place, and time. He appears well-developed and well-nourished.  HENT:  Head: Normocephalic and atraumatic.  Eyes: EOM are normal. Pupils are equal, round, and reactive to light.  Neck: Normal range of motion. Neck supple.  Cardiovascular: Normal rate, regular rhythm and normal heart sounds.   No murmur heard. Pulmonary/Chest: Effort normal and breath sounds normal.  Abdominal: Soft. Bowel sounds are normal. He exhibits no distension and no mass. There is no tenderness. There is no rebound and no guarding.  Musculoskeletal: Normal range of motion. He exhibits no edema.  Neurological: He is alert and oriented to person, place, and time. No cranial nerve deficit.       Strength intact bilaterally. Normal speech.  Skin: Skin is warm and dry.  Psychiatric: He has a normal mood and affect.    ED Course  Procedures (including critical care time)  Labs Reviewed  CBC - Abnormal; Notable for the following:    RBC 3.17 (*)    Hemoglobin 10.3 (*)    HCT 29.8 (*)    Platelets 454 (*)    All other components within normal limits  COMPREHENSIVE METABOLIC PANEL - Abnormal; Notable for the following:    BUN 27 (*)    Creatinine, Ser 1.76 (*)    GFR calc non Af Amer 36 (*)    GFR calc Af Amer 42 (*)    All other components within normal limits  PROTIME-INR - Abnormal; Notable for the following:    Prothrombin Time 23.1 (*)    INR 2.01 (*)    All other components within normal limits  APTT - Abnormal; Notable for the following:    aPTT 48 (*)    All other components within normal limits  DIFFERENTIAL  URINALYSIS, ROUTINE W REFLEX MICROSCOPIC   Dg Chest 2 View  08/14/2011  *RADIOLOGY REPORT*  Clinical Data: Aphasia.  History of hypertension.  CHEST - 2 VIEW   Comparison: 07/26/2011.  Findings: The heart size and mediastinal contours are stable. There is chronic central airway thickening with mild scarring  in the right middle and upper lobes.  Appearance is stable.  There is no confluent airspace opacity or pleural effusion.  Osseous structures appear unchanged.  IMPRESSION: Stable mild chronic lung disease.  No acute cardiopulmonary process.  Original Report Authenticated By: Gerrianne Scale, M.D.   Ct Head Wo Contrast  08/14/2011  *RADIOLOGY REPORT*  Clinical Data: Recent surgery with aphasia for 2 weeks.  The patient is on blood thinners.  CT HEAD WITHOUT CONTRAST  Technique:  Contiguous axial images were obtained from the base of the skull through the vertex without contrast.  Comparison: None.  Findings: There is a low density subdural hematoma layering over the left frontal and parietal convexity.  This measures up to 2.1 cm in thickness and mildly deforms the adjacent cortex.  There is 3 mm of left to right midline shift.  No intraparenchymal hemorrhage is demonstrated.  There is no evidence of brain edema, hydrocephalus or infarction.  Mild cerebellar atrophy is noted.  The visualized paranasal sinuses are clear.  Calvarium is intact.  IMPRESSION:  1.  Moderate sized left hemispheric subdural hematoma demonstrates low density consistent with a subacute age and/or anemia (the patient has been recently anemic).  There is mild mass effect with minimal midline shift. 2.  No evidence of hydrocephalus or acute infarction.  Critical Value/emergent results were called by telephone at the time of interpretation on 08/14/2011  at 2020 hours  to  Dr. Rubin Payor, who verbally acknowledged these results.  Original Report Authenticated By: Gerrianne Scale, M.D.     1. Subdural hematoma   2. Aphasia      Date: 08/14/2011  Rate: 68  Rhythm: normal sinus rhythm  QRS Axis: normal  Intervals: normal  ST/T Wave abnormalities: normal  Conduction Disutrbances:none   Narrative Interpretation:   Old EKG Reviewed: unchanged    MDM  Patient presents with episodic A. aphasia. No symptoms now. He is on anticoagulations after hip surgery. CT showed a subdural hematoma. I discussed the case with Dr. Lovell Sheehan from neurosurgery. He recommends since there does not appear to be active bleeding on the CT that the patient just hold his anticoagulation cannot be actively reversed. He recommends followup in a week. I discussed the case with Dr. Evlyn Kanner who is covering for Dr. Sherrye Payor. He'll be seen in the office by them tomorrow.        Juliet Rude. Rubin Payor, MD 08/14/11 2147

## 2011-08-14 NOTE — ED Notes (Signed)
Patient stable upon discharge. Discharged to care of family and states understanding of paperwork.  

## 2011-08-14 NOTE — ED Notes (Signed)
Pt states he was unable to speak earlier this am. States he has had intermittent episodes of aphasia August 04, 2011. Pt states that he does not have a history of stroke, did have total left hip replacement and is on blood thinners and aspirin. Pt denies falling, or hitting his head. Pt states that PCP sent him to the ER for further evaluation. Neuro assessment WNL. Pt concerned that he may be having TIA's.

## 2011-08-15 DIAGNOSIS — I10 Essential (primary) hypertension: Secondary | ICD-10-CM | POA: Diagnosis not present

## 2011-08-15 DIAGNOSIS — M1990 Unspecified osteoarthritis, unspecified site: Secondary | ICD-10-CM | POA: Diagnosis not present

## 2011-08-15 DIAGNOSIS — I6200 Nontraumatic subdural hemorrhage, unspecified: Secondary | ICD-10-CM | POA: Diagnosis not present

## 2011-08-16 DIAGNOSIS — I10 Essential (primary) hypertension: Secondary | ICD-10-CM | POA: Diagnosis not present

## 2011-08-16 DIAGNOSIS — IMO0001 Reserved for inherently not codable concepts without codable children: Secondary | ICD-10-CM | POA: Diagnosis not present

## 2011-08-16 DIAGNOSIS — Z7901 Long term (current) use of anticoagulants: Secondary | ICD-10-CM | POA: Diagnosis not present

## 2011-08-16 DIAGNOSIS — Z96649 Presence of unspecified artificial hip joint: Secondary | ICD-10-CM | POA: Diagnosis not present

## 2011-08-16 DIAGNOSIS — Z471 Aftercare following joint replacement surgery: Secondary | ICD-10-CM | POA: Diagnosis not present

## 2011-08-20 DIAGNOSIS — Z471 Aftercare following joint replacement surgery: Secondary | ICD-10-CM | POA: Diagnosis not present

## 2011-08-20 DIAGNOSIS — Z96649 Presence of unspecified artificial hip joint: Secondary | ICD-10-CM | POA: Diagnosis not present

## 2011-08-20 DIAGNOSIS — IMO0001 Reserved for inherently not codable concepts without codable children: Secondary | ICD-10-CM | POA: Diagnosis not present

## 2011-08-20 DIAGNOSIS — Z7901 Long term (current) use of anticoagulants: Secondary | ICD-10-CM | POA: Diagnosis not present

## 2011-08-20 DIAGNOSIS — I10 Essential (primary) hypertension: Secondary | ICD-10-CM | POA: Diagnosis not present

## 2011-08-21 DIAGNOSIS — IMO0001 Reserved for inherently not codable concepts without codable children: Secondary | ICD-10-CM | POA: Diagnosis not present

## 2011-08-21 DIAGNOSIS — Z471 Aftercare following joint replacement surgery: Secondary | ICD-10-CM | POA: Diagnosis not present

## 2011-08-21 DIAGNOSIS — Z96649 Presence of unspecified artificial hip joint: Secondary | ICD-10-CM | POA: Diagnosis not present

## 2011-08-21 DIAGNOSIS — Z7901 Long term (current) use of anticoagulants: Secondary | ICD-10-CM | POA: Diagnosis not present

## 2011-08-21 DIAGNOSIS — I10 Essential (primary) hypertension: Secondary | ICD-10-CM | POA: Diagnosis not present

## 2011-08-22 DIAGNOSIS — Z7901 Long term (current) use of anticoagulants: Secondary | ICD-10-CM | POA: Diagnosis not present

## 2011-08-22 DIAGNOSIS — Z96649 Presence of unspecified artificial hip joint: Secondary | ICD-10-CM | POA: Diagnosis not present

## 2011-08-22 DIAGNOSIS — IMO0001 Reserved for inherently not codable concepts without codable children: Secondary | ICD-10-CM | POA: Diagnosis not present

## 2011-08-22 DIAGNOSIS — Z471 Aftercare following joint replacement surgery: Secondary | ICD-10-CM | POA: Diagnosis not present

## 2011-08-22 DIAGNOSIS — I10 Essential (primary) hypertension: Secondary | ICD-10-CM | POA: Diagnosis not present

## 2011-08-23 ENCOUNTER — Other Ambulatory Visit: Payer: Self-pay | Admitting: Neurosurgery

## 2011-08-23 ENCOUNTER — Ambulatory Visit
Admission: RE | Admit: 2011-08-23 | Discharge: 2011-08-23 | Disposition: A | Discharge status: Home / Self Care | Payer: Medicare Other | Source: Ambulatory Visit | Attending: Neurosurgery | Admitting: Neurosurgery

## 2011-08-23 DIAGNOSIS — S065X9A Traumatic subdural hemorrhage with loss of consciousness of unspecified duration, initial encounter: Secondary | ICD-10-CM

## 2011-08-23 DIAGNOSIS — Z09 Encounter for follow-up examination after completed treatment for conditions other than malignant neoplasm: Secondary | ICD-10-CM | POA: Diagnosis not present

## 2011-08-23 DIAGNOSIS — I6200 Nontraumatic subdural hemorrhage, unspecified: Secondary | ICD-10-CM | POA: Diagnosis not present

## 2011-08-26 DIAGNOSIS — IMO0001 Reserved for inherently not codable concepts without codable children: Secondary | ICD-10-CM | POA: Diagnosis not present

## 2011-08-26 DIAGNOSIS — I10 Essential (primary) hypertension: Secondary | ICD-10-CM | POA: Diagnosis not present

## 2011-08-26 DIAGNOSIS — Z7901 Long term (current) use of anticoagulants: Secondary | ICD-10-CM | POA: Diagnosis not present

## 2011-08-26 DIAGNOSIS — Z471 Aftercare following joint replacement surgery: Secondary | ICD-10-CM | POA: Diagnosis not present

## 2011-08-26 DIAGNOSIS — Z96649 Presence of unspecified artificial hip joint: Secondary | ICD-10-CM | POA: Diagnosis not present

## 2011-08-29 ENCOUNTER — Other Ambulatory Visit: Payer: Self-pay | Admitting: Dermatology

## 2011-08-29 DIAGNOSIS — D235 Other benign neoplasm of skin of trunk: Secondary | ICD-10-CM | POA: Diagnosis not present

## 2011-08-29 DIAGNOSIS — Z471 Aftercare following joint replacement surgery: Secondary | ICD-10-CM | POA: Diagnosis not present

## 2011-08-29 DIAGNOSIS — L570 Actinic keratosis: Secondary | ICD-10-CM | POA: Diagnosis not present

## 2011-08-29 DIAGNOSIS — Z96649 Presence of unspecified artificial hip joint: Secondary | ICD-10-CM | POA: Diagnosis not present

## 2011-08-29 DIAGNOSIS — I10 Essential (primary) hypertension: Secondary | ICD-10-CM | POA: Diagnosis not present

## 2011-08-29 DIAGNOSIS — Z7901 Long term (current) use of anticoagulants: Secondary | ICD-10-CM | POA: Diagnosis not present

## 2011-08-29 DIAGNOSIS — IMO0001 Reserved for inherently not codable concepts without codable children: Secondary | ICD-10-CM | POA: Diagnosis not present

## 2011-08-29 DIAGNOSIS — C4441 Basal cell carcinoma of skin of scalp and neck: Secondary | ICD-10-CM | POA: Diagnosis not present

## 2011-08-30 DIAGNOSIS — IMO0001 Reserved for inherently not codable concepts without codable children: Secondary | ICD-10-CM | POA: Diagnosis not present

## 2011-08-30 DIAGNOSIS — Z471 Aftercare following joint replacement surgery: Secondary | ICD-10-CM | POA: Diagnosis not present

## 2011-08-30 DIAGNOSIS — Z96649 Presence of unspecified artificial hip joint: Secondary | ICD-10-CM | POA: Diagnosis not present

## 2011-08-30 DIAGNOSIS — I10 Essential (primary) hypertension: Secondary | ICD-10-CM | POA: Diagnosis not present

## 2011-08-30 DIAGNOSIS — Z7901 Long term (current) use of anticoagulants: Secondary | ICD-10-CM | POA: Diagnosis not present

## 2011-09-05 DIAGNOSIS — M169 Osteoarthritis of hip, unspecified: Secondary | ICD-10-CM | POA: Diagnosis not present

## 2011-09-06 ENCOUNTER — Ambulatory Visit
Admission: RE | Admit: 2011-09-06 | Discharge: 2011-09-06 | Disposition: A | Discharge status: Home / Self Care | Payer: Medicare Other | Source: Ambulatory Visit | Attending: Neurosurgery | Admitting: Neurosurgery

## 2011-09-06 DIAGNOSIS — I6200 Nontraumatic subdural hemorrhage, unspecified: Secondary | ICD-10-CM | POA: Diagnosis not present

## 2011-09-06 DIAGNOSIS — R42 Dizziness and giddiness: Secondary | ICD-10-CM | POA: Diagnosis not present

## 2011-09-06 DIAGNOSIS — S065X9A Traumatic subdural hemorrhage with loss of consciousness of unspecified duration, initial encounter: Secondary | ICD-10-CM

## 2011-09-11 ENCOUNTER — Other Ambulatory Visit: Payer: Self-pay | Admitting: Neurosurgery

## 2011-09-11 DIAGNOSIS — I6200 Nontraumatic subdural hemorrhage, unspecified: Secondary | ICD-10-CM

## 2011-09-11 DIAGNOSIS — C44319 Basal cell carcinoma of skin of other parts of face: Secondary | ICD-10-CM | POA: Diagnosis not present

## 2011-09-16 DIAGNOSIS — G5620 Lesion of ulnar nerve, unspecified upper limb: Secondary | ICD-10-CM | POA: Diagnosis not present

## 2011-09-19 DIAGNOSIS — G5620 Lesion of ulnar nerve, unspecified upper limb: Secondary | ICD-10-CM | POA: Diagnosis not present

## 2011-10-10 ENCOUNTER — Ambulatory Visit
Admission: RE | Admit: 2011-10-10 | Discharge: 2011-10-10 | Disposition: A | Discharge status: Home / Self Care | Payer: Medicare Other | Source: Ambulatory Visit | Attending: Neurosurgery | Admitting: Neurosurgery

## 2011-10-10 DIAGNOSIS — I6200 Nontraumatic subdural hemorrhage, unspecified: Secondary | ICD-10-CM | POA: Diagnosis not present

## 2011-10-10 DIAGNOSIS — S0990XA Unspecified injury of head, initial encounter: Secondary | ICD-10-CM | POA: Diagnosis not present

## 2011-10-10 DIAGNOSIS — C61 Malignant neoplasm of prostate: Secondary | ICD-10-CM | POA: Diagnosis not present

## 2011-10-11 HISTORY — PX: OTHER SURGICAL HISTORY: SHX169

## 2011-10-30 DIAGNOSIS — Z85828 Personal history of other malignant neoplasm of skin: Secondary | ICD-10-CM | POA: Diagnosis not present

## 2011-10-30 DIAGNOSIS — D485 Neoplasm of uncertain behavior of skin: Secondary | ICD-10-CM | POA: Diagnosis not present

## 2011-10-30 DIAGNOSIS — L570 Actinic keratosis: Secondary | ICD-10-CM | POA: Diagnosis not present

## 2011-11-11 HISTORY — PX: ULNAR NERVE REPAIR: SHX2594

## 2011-11-26 DIAGNOSIS — G5620 Lesion of ulnar nerve, unspecified upper limb: Secondary | ICD-10-CM | POA: Diagnosis not present

## 2011-12-12 DIAGNOSIS — R7301 Impaired fasting glucose: Secondary | ICD-10-CM | POA: Diagnosis not present

## 2011-12-12 DIAGNOSIS — Z125 Encounter for screening for malignant neoplasm of prostate: Secondary | ICD-10-CM | POA: Diagnosis not present

## 2011-12-12 DIAGNOSIS — I10 Essential (primary) hypertension: Secondary | ICD-10-CM | POA: Diagnosis not present

## 2011-12-12 DIAGNOSIS — M109 Gout, unspecified: Secondary | ICD-10-CM | POA: Diagnosis not present

## 2011-12-12 DIAGNOSIS — I739 Peripheral vascular disease, unspecified: Secondary | ICD-10-CM | POA: Diagnosis not present

## 2011-12-12 DIAGNOSIS — E785 Hyperlipidemia, unspecified: Secondary | ICD-10-CM | POA: Diagnosis not present

## 2011-12-13 DIAGNOSIS — Z1212 Encounter for screening for malignant neoplasm of rectum: Secondary | ICD-10-CM | POA: Diagnosis not present

## 2011-12-19 DIAGNOSIS — I10 Essential (primary) hypertension: Secondary | ICD-10-CM | POA: Diagnosis not present

## 2011-12-19 DIAGNOSIS — I6200 Nontraumatic subdural hemorrhage, unspecified: Secondary | ICD-10-CM | POA: Diagnosis not present

## 2011-12-19 DIAGNOSIS — Z0000 Encounter for general adult medical examination without abnormal findings: Secondary | ICD-10-CM | POA: Diagnosis not present

## 2011-12-19 DIAGNOSIS — I440 Atrioventricular block, first degree: Secondary | ICD-10-CM | POA: Diagnosis not present

## 2011-12-30 DIAGNOSIS — D3130 Benign neoplasm of unspecified choroid: Secondary | ICD-10-CM | POA: Diagnosis not present

## 2011-12-30 DIAGNOSIS — H2510 Age-related nuclear cataract, unspecified eye: Secondary | ICD-10-CM | POA: Diagnosis not present

## 2011-12-30 DIAGNOSIS — Z961 Presence of intraocular lens: Secondary | ICD-10-CM | POA: Diagnosis not present

## 2012-01-09 DIAGNOSIS — M169 Osteoarthritis of hip, unspecified: Secondary | ICD-10-CM | POA: Diagnosis not present

## 2012-03-18 DIAGNOSIS — Z23 Encounter for immunization: Secondary | ICD-10-CM | POA: Diagnosis not present

## 2012-03-24 DIAGNOSIS — Z79899 Other long term (current) drug therapy: Secondary | ICD-10-CM | POA: Diagnosis not present

## 2012-03-25 DIAGNOSIS — Z79899 Other long term (current) drug therapy: Secondary | ICD-10-CM | POA: Diagnosis not present

## 2012-03-27 DIAGNOSIS — E785 Hyperlipidemia, unspecified: Secondary | ICD-10-CM | POA: Diagnosis not present

## 2012-04-27 DIAGNOSIS — Z23 Encounter for immunization: Secondary | ICD-10-CM | POA: Diagnosis not present

## 2012-04-27 DIAGNOSIS — R0989 Other specified symptoms and signs involving the circulatory and respiratory systems: Secondary | ICD-10-CM | POA: Diagnosis not present

## 2012-04-27 DIAGNOSIS — R0609 Other forms of dyspnea: Secondary | ICD-10-CM | POA: Diagnosis not present

## 2012-04-27 DIAGNOSIS — I10 Essential (primary) hypertension: Secondary | ICD-10-CM | POA: Diagnosis not present

## 2012-05-26 DIAGNOSIS — G4733 Obstructive sleep apnea (adult) (pediatric): Secondary | ICD-10-CM | POA: Diagnosis not present

## 2012-05-27 DIAGNOSIS — Z85828 Personal history of other malignant neoplasm of skin: Secondary | ICD-10-CM | POA: Diagnosis not present

## 2012-06-01 DIAGNOSIS — C61 Malignant neoplasm of prostate: Secondary | ICD-10-CM | POA: Diagnosis not present

## 2012-06-01 DIAGNOSIS — R310 Gross hematuria: Secondary | ICD-10-CM | POA: Diagnosis not present

## 2012-06-08 ENCOUNTER — Other Ambulatory Visit: Payer: Self-pay | Admitting: Urology

## 2012-06-26 ENCOUNTER — Encounter (HOSPITAL_BASED_OUTPATIENT_CLINIC_OR_DEPARTMENT_OTHER): Payer: Self-pay | Admitting: *Deleted

## 2012-06-26 NOTE — Progress Notes (Signed)
NPO AFTER MN. ARRIVES AT 1015. NEEDS ISTAT. CURRENT EKG IN EPIC AND CHART. 

## 2012-07-01 ENCOUNTER — Encounter (HOSPITAL_BASED_OUTPATIENT_CLINIC_OR_DEPARTMENT_OTHER): Payer: Self-pay | Admitting: *Deleted

## 2012-07-03 ENCOUNTER — Ambulatory Visit (HOSPITAL_BASED_OUTPATIENT_CLINIC_OR_DEPARTMENT_OTHER)
Admission: RE | Admit: 2012-07-03 | Discharge: 2012-07-03 | Disposition: A | Discharge status: Home / Self Care | Payer: Medicare Other | Source: Ambulatory Visit | Attending: Urology | Admitting: Urology

## 2012-07-03 ENCOUNTER — Encounter (HOSPITAL_BASED_OUTPATIENT_CLINIC_OR_DEPARTMENT_OTHER): Payer: Self-pay | Admitting: Anesthesiology

## 2012-07-03 ENCOUNTER — Encounter (HOSPITAL_BASED_OUTPATIENT_CLINIC_OR_DEPARTMENT_OTHER)
Admission: RE | Disposition: A | Discharge status: Home / Self Care | Payer: Self-pay | Source: Ambulatory Visit | Attending: Urology

## 2012-07-03 ENCOUNTER — Ambulatory Visit (HOSPITAL_BASED_OUTPATIENT_CLINIC_OR_DEPARTMENT_OTHER): Payer: Medicare Other | Admitting: Anesthesiology

## 2012-07-03 ENCOUNTER — Encounter (HOSPITAL_BASED_OUTPATIENT_CLINIC_OR_DEPARTMENT_OTHER): Payer: Self-pay

## 2012-07-03 DIAGNOSIS — R319 Hematuria, unspecified: Secondary | ICD-10-CM | POA: Diagnosis not present

## 2012-07-03 DIAGNOSIS — E785 Hyperlipidemia, unspecified: Secondary | ICD-10-CM | POA: Diagnosis not present

## 2012-07-03 DIAGNOSIS — R82998 Other abnormal findings in urine: Secondary | ICD-10-CM | POA: Insufficient documentation

## 2012-07-03 DIAGNOSIS — R310 Gross hematuria: Secondary | ICD-10-CM | POA: Diagnosis not present

## 2012-07-03 DIAGNOSIS — I10 Essential (primary) hypertension: Secondary | ICD-10-CM | POA: Insufficient documentation

## 2012-07-03 DIAGNOSIS — Z923 Personal history of irradiation: Secondary | ICD-10-CM | POA: Diagnosis not present

## 2012-07-03 DIAGNOSIS — N3090 Cystitis, unspecified without hematuria: Secondary | ICD-10-CM | POA: Diagnosis not present

## 2012-07-03 DIAGNOSIS — Z8546 Personal history of malignant neoplasm of prostate: Secondary | ICD-10-CM | POA: Diagnosis not present

## 2012-07-03 DIAGNOSIS — Z96649 Presence of unspecified artificial hip joint: Secondary | ICD-10-CM | POA: Insufficient documentation

## 2012-07-03 DIAGNOSIS — M109 Gout, unspecified: Secondary | ICD-10-CM | POA: Insufficient documentation

## 2012-07-03 HISTORY — DX: Hyperlipidemia, unspecified: E78.5

## 2012-07-03 HISTORY — PX: CYSTOSCOPY WITH BIOPSY: SHX5122

## 2012-07-03 HISTORY — DX: Personal history of malignant neoplasm of prostate: Z85.46

## 2012-07-03 HISTORY — DX: Hematuria, unspecified: R31.9

## 2012-07-03 HISTORY — DX: Personal history of other diseases of the musculoskeletal system and connective tissue: Z87.39

## 2012-07-03 HISTORY — DX: Personal history of other malignant neoplasm of skin: Z98.890

## 2012-07-03 HISTORY — DX: Personal history of other diseases of the circulatory system: Z86.79

## 2012-07-03 HISTORY — DX: Personal history of other malignant neoplasm of skin: Z85.828

## 2012-07-03 LAB — POCT I-STAT 4, (NA,K, GLUC, HGB,HCT): Glucose, Bld: 95 mg/dL (ref 70–99)

## 2012-07-03 SURGERY — CYSTOSCOPY, WITH BIOPSY
Anesthesia: General | Site: Bladder | Wound class: Clean Contaminated

## 2012-07-03 MED ORDER — SODIUM CHLORIDE 0.9 % IJ SOLN
3.0000 mL | INTRAMUSCULAR | Status: DC | PRN
  Filled 2012-07-03: qty 3

## 2012-07-03 MED ORDER — FENTANYL CITRATE 0.05 MG/ML IJ SOLN
INTRAMUSCULAR | Status: DC | PRN
  Administered 2012-07-03 (×3): 50 ug via INTRAVENOUS

## 2012-07-03 MED ORDER — ACETAMINOPHEN 650 MG RE SUPP
650.0000 mg | RECTAL | Status: DC | PRN
  Filled 2012-07-03: qty 1

## 2012-07-03 MED ORDER — SODIUM CHLORIDE 0.9 % IV SOLN
250.0000 mL | INTRAVENOUS | Status: DC | PRN
  Filled 2012-07-03: qty 250

## 2012-07-03 MED ORDER — OXYCODONE HCL 5 MG PO TABS
5.0000 mg | ORAL_TABLET | ORAL | Status: DC | PRN
  Filled 2012-07-03: qty 2

## 2012-07-03 MED ORDER — LIDOCAINE HCL (CARDIAC) 20 MG/ML IV SOLN
INTRAVENOUS | Status: DC | PRN
  Administered 2012-07-03: 100 mg via INTRAVENOUS

## 2012-07-03 MED ORDER — CIPROFLOXACIN HCL 250 MG PO TABS: 250.0000 mg | ORAL_TABLET | Freq: Two times a day (BID) | ORAL | Status: DC

## 2012-07-03 MED ORDER — LACTATED RINGERS IV SOLN
INTRAVENOUS | Status: DC | PRN
  Administered 2012-07-03 (×2): via INTRAVENOUS

## 2012-07-03 MED ORDER — ACETAMINOPHEN 10 MG/ML IV SOLN
1000.0000 mg | Freq: Four times a day (QID) | INTRAVENOUS | Status: DC
  Filled 2012-07-03: qty 100

## 2012-07-03 MED ORDER — LACTATED RINGERS IV SOLN
INTRAVENOUS | Status: DC
  Filled 2012-07-03: qty 1000

## 2012-07-03 MED ORDER — PROMETHAZINE HCL 25 MG/ML IJ SOLN
6.2500 mg | INTRAMUSCULAR | Status: DC | PRN
  Filled 2012-07-03: qty 1

## 2012-07-03 MED ORDER — ONDANSETRON HCL 4 MG/2ML IJ SOLN
INTRAMUSCULAR | Status: DC | PRN
  Administered 2012-07-03: 4 mg via INTRAVENOUS

## 2012-07-03 MED ORDER — LACTATED RINGERS IV SOLN
INTRAVENOUS | Status: DC
  Administered 2012-07-03: 11:00:00 via INTRAVENOUS
  Administered 2012-07-03: 100 mL/h via INTRAVENOUS
  Filled 2012-07-03: qty 1000

## 2012-07-03 MED ORDER — ACETAMINOPHEN 325 MG PO TABS
650.0000 mg | ORAL_TABLET | ORAL | Status: DC | PRN
  Filled 2012-07-03: qty 2

## 2012-07-03 MED ORDER — ONDANSETRON HCL 4 MG/2ML IJ SOLN
4.0000 mg | Freq: Four times a day (QID) | INTRAMUSCULAR | Status: DC | PRN
  Filled 2012-07-03: qty 2

## 2012-07-03 MED ORDER — KETOROLAC TROMETHAMINE 30 MG/ML IJ SOLN
INTRAMUSCULAR | Status: DC | PRN
  Administered 2012-07-03: 30 mg via INTRAVENOUS

## 2012-07-03 MED ORDER — PROPOFOL 10 MG/ML IV BOLUS
INTRAVENOUS | Status: DC | PRN
  Administered 2012-07-03: 180 mg via INTRAVENOUS

## 2012-07-03 MED ORDER — URIBEL 118 MG PO CAPS: 1.0000 | ORAL_CAPSULE | Freq: Three times a day (TID) | ORAL | Status: DC | PRN

## 2012-07-03 MED ORDER — SODIUM CHLORIDE 0.9 % IJ SOLN
3.0000 mL | Freq: Two times a day (BID) | INTRAMUSCULAR | Status: DC
  Filled 2012-07-03: qty 3

## 2012-07-03 MED ORDER — CIPROFLOXACIN IN D5W 400 MG/200ML IV SOLN
400.0000 mg | INTRAVENOUS | Status: AC
  Administered 2012-07-03: 400 mg via INTRAVENOUS
  Filled 2012-07-03: qty 200

## 2012-07-03 MED ORDER — FENTANYL CITRATE 0.05 MG/ML IJ SOLN
25.0000 ug | INTRAMUSCULAR | Status: DC | PRN
  Filled 2012-07-03: qty 1

## 2012-07-03 MED ORDER — BELLADONNA-OPIUM 16.2-30 MG RE SUPP
RECTAL | Status: DC | PRN
  Administered 2012-07-03: 60 mg via RECTAL

## 2012-07-03 MED ORDER — DEXAMETHASONE SODIUM PHOSPHATE 4 MG/ML IJ SOLN
INTRAMUSCULAR | Status: DC | PRN
  Administered 2012-07-03: 4 mg via INTRAVENOUS

## 2012-07-03 MED ORDER — STERILE WATER FOR IRRIGATION IR SOLN
Status: DC | PRN
  Administered 2012-07-03: 3000 mL

## 2012-07-03 MED ORDER — GLYCOPYRROLATE 0.2 MG/ML IJ SOLN
INTRAMUSCULAR | Status: DC | PRN
  Administered 2012-07-03: 0.2 mg via INTRAVENOUS

## 2012-07-03 SURGICAL SUPPLY — 19 items
BAG DRAIN URO-CYSTO SKYTR STRL (DRAIN) ×2 IMPLANT
BAG DRN UROCATH (DRAIN) ×1
CANISTER SUCT LVC 12 LTR MEDI- (MISCELLANEOUS) ×1 IMPLANT
CLOTH BEACON ORANGE TIMEOUT ST (SAFETY) ×2 IMPLANT
DRAPE CAMERA CLOSED 9X96 (DRAPES) ×2 IMPLANT
ELECT REM PT RETURN 9FT ADLT (ELECTROSURGICAL) ×2
ELECTRODE REM PT RTRN 9FT ADLT (ELECTROSURGICAL) ×1 IMPLANT
GLOVE BIO SURGEON STRL SZ 6.5 (GLOVE) ×1 IMPLANT
GLOVE BIO SURGEON STRL SZ8 (GLOVE) ×2 IMPLANT
GLOVE ECLIPSE 6.0 STRL STRAW (GLOVE) ×1 IMPLANT
GOWN PREVENTION PLUS LG XLONG (DISPOSABLE) ×2 IMPLANT
GOWN STRL REIN XL XLG (GOWN DISPOSABLE) ×2 IMPLANT
NDL SAFETY ECLIPSE 18X1.5 (NEEDLE) IMPLANT
NEEDLE HYPO 18GX1.5 SHARP (NEEDLE)
NEEDLE HYPO 22GX1.5 SAFETY (NEEDLE) IMPLANT
NS IRRIG 500ML POUR BTL (IV SOLUTION) IMPLANT
PACK CYSTOSCOPY (CUSTOM PROCEDURE TRAY) ×2 IMPLANT
SYR 20CC LL (SYRINGE) IMPLANT
WATER STERILE IRR 3000ML UROMA (IV SOLUTION) ×2 IMPLANT

## 2012-07-03 NOTE — Anesthesia Procedure Notes (Signed)
Procedure Name: LMA Insertion Date/Time: 07/03/2012 11:55 AM Performed by: Jessica Priest Pre-anesthesia Checklist: Patient identified, Emergency Drugs available, Suction available and Patient being monitored Patient Re-evaluated:Patient Re-evaluated prior to inductionOxygen Delivery Method: Circle System Utilized Preoxygenation: Pre-oxygenation with 100% oxygen Intubation Type: IV induction Ventilation: Mask ventilation without difficulty LMA: LMA inserted LMA Size: 5.0 Number of attempts: 1 Airway Equipment and Method: bite block Placement Confirmation: positive ETCO2 Tube secured with: Tape Dental Injury: Teeth and Oropharynx as per pre-operative assessment

## 2012-07-03 NOTE — Transfer of Care (Signed)
Immediate Anesthesia Transfer of Care Note  Patient: Lance Martin  Procedure(s) Performed: Procedure(s) (LRB): CYSTOSCOPY WITH BIOPSY (N/A)  Patient Location: PACU  Anesthesia Type: General  Level of Consciousness: awake, sedated, patient cooperative and responds to stimulation  Airway & Oxygen Therapy: Patient Spontanous Breathing and Patient connected to face mask oxygen  Post-op Assessment: Report given to PACU RN, Post -op Vital signs reviewed and stable and Patient moving all extremities  Post vital signs: Reviewed and stable  Complications: No apparent anesthesia complications

## 2012-07-03 NOTE — Anesthesia Postprocedure Evaluation (Signed)
Anesthesia Post Note  Patient: Lance Martin  Procedure(s) Performed: Procedure(s) (LRB): CYSTOSCOPY WITH BIOPSY (N/A)  Anesthesia type: General  Patient location: PACU  Post pain: Pain level controlled  Post assessment: Post-op Vital signs reviewed  Last Vitals:  Filed Vitals:   07/03/12 1225  BP: 119/56  Pulse: 64  Temp: 36.2 C  Resp: 9    Post vital signs: Reviewed  Level of consciousness: sedated  Complications: No apparent anesthesia complications

## 2012-07-03 NOTE — H&P (Signed)
  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 He is s/p XRT for PCa in 2004, and recently has had gross painless hematuria. Cystoscopy in the office revealed  Multiple telangiectatic vessels in the lower bladder c/w past radiation, but cytology revealed atypia. HE presents now for biopsy.  PMH: Past Medical History  Diagnosis Date  . Hypertension   . Hematuria   . Hx of subdural hematoma 08-14-2011    s/p total left hip  12-92012---  resolved w/ no residual  . History of basal cell carcinoma excision 08-29-2011  left frontal scalp  . History of prostate cancer 2004   -  RX EXTERNAL RADIATION  . History of gout PER PT -  STABLE  . Arthritis   . Hyperlipidemia     PSH: Past Surgical History  Procedure Date  . Left elbow surgery 2009    ELBOW AND WRIST SURGERY  . Eye surgery 2009    RIGHT CATARACT EXTRACTION  . Tonsillectomy     AGE 19  . Total hip arthroplasty 07/31/2011    Procedure: TOTAL HIP ARTHROPLASTY;  Surgeon: Gus Rankin Aluisio;  Location: [facility];  Service: Orthopedics;  Laterality: Left;  . Total hip arthroplasty   . Right elbow surgery MAR 2013  . Cardiac catheterization     Allergies: Allergies  Allergen Reactions  . Allopurinol Hives and Other (See Comments)    Cause renal failure   . Ampicillin Diarrhea and Nausea And Vomiting    Medications: No prescriptions prior to admission     Social History: History   Social History  . Marital Status: Married    Spouse Name: N/A    Number of Children: N/A  . Years of Education: N/A   Occupational History  . Not on file.   Social History Main Topics  . Smoking status: Never Smoker   . Smokeless tobacco: Never Used  . Alcohol Use: 8.4 oz/week    7 Glasses of wine, 7 Shots of liquor per week  . Drug Use: No  . Sexually Active:    Other Topics Concern  . Not on file   Social History Narrative  . No  narrative on file    Family History: History reviewed. No pertinent family history.  Review of Systems: Positive: Hematuria Negative:   A further 10 point review of systems was negative except what is listed in the HPI.  Physical Exam: @VITALS2 @ General: No acute distress.  Awake. Head:  Normocephalic.  Atraumatic. ENT:  EOMI.  Mucous membranes moist Neck:  Supple.  No lymphadenopathy. CV:  S1 present. S2 present. Regular rate. Pulmonary: Equal effort bilaterally.  Clear to auscultation bilaterally. Abdomen: Soft.  Non tender to palpation. Skin:  Normal turgor.  No visible rash. Extremity: No gross deformity of bilateral upper extremities.  No gross deformity of    bilateral lower extremities. Neurologic: Alert. Appropriate mood.    Studies:  No results found for this basename: HGB:2,WBC:2,PLT:2 in the last 72 hours  No results found for this basename: NA:2,K:2,CL:2,CO2:2,BUN:2,CREATININE:2,CALCIUM:2,MAGNESIUM:2,GFRNONAA:2,GFRAA:2 in the last 72 hours   No results found for this basename: PT:2,INR:2,APTT:2 in the last 72 hours   No components found with this basename: ABG:2    Assessment:  Gross hematuria with atypical cytologies  Plan: Cystoscopy with biopsy

## 2012-07-03 NOTE — Anesthesia Preprocedure Evaluation (Addendum)
Anesthesia Evaluation  Patient identified by MRN, date of birth, ID band Patient awake    Reviewed: Allergy & Precautions, H&P , NPO status , Patient's Chart, lab work & pertinent test results  Airway Mallampati: II TM Distance: >3 FB Neck ROM: Limited    Dental No notable dental hx.    Pulmonary neg pulmonary ROS,  breath sounds clear to auscultation  Pulmonary exam normal       Cardiovascular hypertension, Pt. on medications Rhythm:Regular Rate:Normal     Neuro/Psych negative neurological ROS  negative psych ROS   GI/Hepatic negative GI ROS, Neg liver ROS,   Endo/Other  negative endocrine ROS  Renal/GU negative Renal ROS  negative genitourinary   Musculoskeletal negative musculoskeletal ROS (+)   Abdominal   Peds  Hematology negative hematology ROS (+)   Anesthesia Other Findings   Reproductive/Obstetrics negative OB ROS                          Anesthesia Physical Anesthesia Plan  ASA: II  Anesthesia Plan: General   Post-op Pain Management:    Induction: Intravenous  Airway Management Planned: LMA  Additional Equipment:   Intra-op Plan:   Post-operative Plan: Extubation in OR  Informed Consent: I have reviewed the patients History and Physical, chart, labs and discussed the procedure including the risks, benefits and alternatives for the proposed anesthesia with the patient or authorized representative who has indicated his/her understanding and acceptance.   Dental advisory given  Plan Discussed with: CRNA  Anesthesia Plan Comments:        Anesthesia Quick Evaluation

## 2012-07-03 NOTE — Interval H&P Note (Signed)
History and Physical Interval Note:  07/03/2012 11:43 AM  Lance Martin  has presented today for surgery, with the diagnosis of Hematuria  The various methods of treatment have been discussed with the patient and family. After consideration of risks, benefits and other options for treatment, the patient has consented to  Procedure(s) (LRB) with comments: CYSTOSCOPY WITH BIOPSY (N/A) -    as a surgical intervention .  The patient's history has been reviewed, patient examined, no change in status, stable for surgery.  I have reviewed the patient's chart and labs.  Questions were answered to the patient's satisfaction.     Chelsea Aus

## 2012-07-03 NOTE — Op Note (Signed)
Preoperative diagnosis: History of gross hematuria with urothelial abnormality and atypical cytologies  Postoperative diagnosis: Same   Procedure: Cystoscopy, bladder biopsies    Surgeon: Bertram Millard. Bentlie Withem, M.D.   Anesthesia: Gen.   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 He received radiotherapy to several years ago. He has had recent gross hematuria and cystoscopy revealed multiple telangiectatic vessels within his bladder. He's had normal upper tracts on imaging, but has had a suspicious cytology. He presents for cystoscopy and bladder biopsy.   Technique and findings: Dr. Earlene Plater was properly identified in the holding area and received preoperative IV antibiotics. He was taken to the operating room where general anesthetic was administered using the LMA. He was placed in the dorsolithotomy position. Genitalia and perineum were prepped and draped. Timeout was then performed.  A 22 French panendoscope was advanced under direct vision through his urethra. There were no urethral lesions. Prostate was somewhat firm and fibrotic but there were no lesions within the prosthetic urethra, which was nonobstructive. His bladder was entered and inspected circumferentially. There were no tumors or foreign bodies. Minimal trabeculations were noted. Towards the bottom half of the bladder, there were multiple telangiectatic vessels. I did not see any papillary lesions or any velvety lesions consistent with possible CIS. These were fairly confluent. Ureteral orifices were normal in location and configuration. Following adequate inspection of the bladder, I used the cold cup biopsy forceps to grasp 3 bits of tissue, one from the midline posterior, one from the right bladder wall and one from the left. These included the abnormal looking  vascularize urothelium. These were sent as random bladder biopsies for permanent pathology. Following this, the Bugbee electrode was used to cauterize biopsied sites. There was no active bleeding at this point. As the biopsy sites were not significantly deep and I did not think there was a chance of perforation, I did not leave the catheter in. A B. and O. suppository was placed. The scope was then removed after the bladder was drained. The patient was then awakened and taken to the PACU in stable condition.

## 2012-07-06 ENCOUNTER — Encounter (HOSPITAL_BASED_OUTPATIENT_CLINIC_OR_DEPARTMENT_OTHER): Payer: Self-pay | Admitting: Urology

## 2012-07-07 DIAGNOSIS — Z96649 Presence of unspecified artificial hip joint: Secondary | ICD-10-CM | POA: Diagnosis not present

## 2012-07-24 ENCOUNTER — Inpatient Hospital Stay (HOSPITAL_COMMUNITY)
Admission: EM | Admit: 2012-07-24 | Discharge: 2012-07-26 | DRG: 669 | Disposition: A | Discharge status: Home / Self Care | Payer: Medicare Other | Attending: Urology | Admitting: Urology

## 2012-07-24 ENCOUNTER — Encounter (HOSPITAL_COMMUNITY): Payer: Self-pay | Admitting: Emergency Medicine

## 2012-07-24 DIAGNOSIS — Z8546 Personal history of malignant neoplasm of prostate: Secondary | ICD-10-CM

## 2012-07-24 DIAGNOSIS — N3040 Irradiation cystitis without hematuria: Principal | ICD-10-CM | POA: Diagnosis present

## 2012-07-24 DIAGNOSIS — R319 Hematuria, unspecified: Secondary | ICD-10-CM | POA: Diagnosis not present

## 2012-07-24 DIAGNOSIS — E785 Hyperlipidemia, unspecified: Secondary | ICD-10-CM | POA: Diagnosis present

## 2012-07-24 DIAGNOSIS — I10 Essential (primary) hypertension: Secondary | ICD-10-CM | POA: Diagnosis not present

## 2012-07-24 DIAGNOSIS — Y842 Radiological procedure and radiotherapy as the cause of abnormal reaction of the patient, or of later complication, without mention of misadventure at the time of the procedure: Secondary | ICD-10-CM | POA: Diagnosis present

## 2012-07-24 DIAGNOSIS — Z96649 Presence of unspecified artificial hip joint: Secondary | ICD-10-CM

## 2012-07-24 DIAGNOSIS — M109 Gout, unspecified: Secondary | ICD-10-CM | POA: Diagnosis not present

## 2012-07-24 DIAGNOSIS — R310 Gross hematuria: Secondary | ICD-10-CM | POA: Diagnosis not present

## 2012-07-24 DIAGNOSIS — R339 Retention of urine, unspecified: Secondary | ICD-10-CM | POA: Diagnosis not present

## 2012-07-24 DIAGNOSIS — N3289 Other specified disorders of bladder: Secondary | ICD-10-CM | POA: Diagnosis not present

## 2012-07-24 DIAGNOSIS — R109 Unspecified abdominal pain: Secondary | ICD-10-CM | POA: Diagnosis not present

## 2012-07-24 LAB — CBC
MCH: 32.9 pg (ref 26.0–34.0)
Platelets: 157 10*3/uL (ref 150–400)
RBC: 3.53 MIL/uL — ABNORMAL LOW (ref 4.22–5.81)
WBC: 9.8 10*3/uL (ref 4.0–10.5)

## 2012-07-24 LAB — BASIC METABOLIC PANEL
CO2: 24 mEq/L (ref 19–32)
Calcium: 9.8 mg/dL (ref 8.4–10.5)
Chloride: 96 mEq/L (ref 96–112)
Potassium: 3.9 mEq/L (ref 3.5–5.1)
Sodium: 135 mEq/L (ref 135–145)

## 2012-07-24 MED ORDER — ONDANSETRON HCL 4 MG/2ML IJ SOLN
4.0000 mg | Freq: Once | INTRAMUSCULAR | Status: AC
  Administered 2012-07-24: 4 mg via INTRAVENOUS
  Filled 2012-07-24: qty 2

## 2012-07-24 MED ORDER — MORPHINE SULFATE 4 MG/ML IJ SOLN
4.0000 mg | Freq: Once | INTRAMUSCULAR | Status: AC
  Administered 2012-07-24: 4 mg via INTRAVENOUS
  Filled 2012-07-24: qty 1

## 2012-07-24 MED ORDER — LIDOCAINE HCL 2 % EX GEL
CUTANEOUS | Status: AC
  Administered 2012-07-24: 22:00:00
  Filled 2012-07-24: qty 10

## 2012-07-24 MED ORDER — SODIUM CHLORIDE 0.9 % IV SOLN
INTRAVENOUS | Status: DC
  Administered 2012-07-24: 21:00:00 via INTRAVENOUS

## 2012-07-24 NOTE — ED Provider Notes (Signed)
History     CSN: [id number]  Arrival date & time 07/24/12  2004   First MD Initiated Contact with Patient 07/24/12 2055      Chief Complaint  Patient presents with  . foley cath clogged     (Consider location/radiation/quality/duration/timing/severity/associated sxs/prior treatment) The history is provided by the patient.   patient here and do to her clogged indwelling Foley catheter. Was seen at urology office today for similar symptoms the catheter was placed which has not been draining do to excessive blood clots. Called his urologist and was told to come here. Does note increased suprapubic pressure consistently bladder spasms. Took medications at home without relief  Past Medical History  Diagnosis Date  . Hypertension   . Hematuria   . Hx of subdural hematoma 08-14-2011    s/p total left hip  12-92012---  resolved w/ no residual  . History of basal cell carcinoma excision 08-29-2011  left frontal scalp  . History of prostate cancer 2004   -  RX EXTERNAL RADIATION  . History of gout PER PT -  STABLE  . Arthritis   . Hyperlipidemia     Past Surgical History  Procedure Date  . Left elbow surgery 2009    ELBOW AND WRIST SURGERY  . Eye surgery 2009    RIGHT CATARACT EXTRACTION  . Tonsillectomy     AGE 78  . Total hip arthroplasty 07/31/2011    Procedure: TOTAL HIP ARTHROPLASTY;  Surgeon: Gus Rankin Aluisio;  Location: [facility];  Service: Orthopedics;  Laterality: Left;  . Total hip arthroplasty   . Right elbow surgery MAR 2013  . Cardiac catheterization   . Cystoscopy with biopsy 07/03/2012    Procedure: CYSTOSCOPY WITH BIOPSY;  Surgeon: Marcine Matar, MD;  Location: [facility];  Service: Urology;  Laterality: N/A;        No family history on file.  History  Substance Use Topics  . Smoking status: Never Smoker   . Smokeless tobacco: Never Used  . Alcohol Use: 8.4 oz/week    7 Glasses of wine, 7 Shots of liquor per week      Review of  Systems  All other systems reviewed and are negative.    Allergies  Allopurinol and Ampicillin  Home Medications   Current Outpatient Rx  Name  Route  Sig  Dispense  Refill  . AMLODIPINE BESYLATE 5 MG PO TABS   Oral   Take 5 mg by mouth every evening.          . ASPIRIN 81 MG PO TABS   Oral   Take 81 mg by mouth daily.         . ATORVASTATIN CALCIUM 20 MG PO TABS   Oral   Take 20 mg by mouth at bedtime.          Marland Kitchen BENAZEPRIL HCL 40 MG PO TABS   Oral   Take 40 mg by mouth at bedtime.          Marland Kitchen CALCIUM CITRATE + D PO   Oral   Take 1 tablet by mouth daily.          Marland Kitchen CETIRIZINE HCL 10 MG PO TABS   Oral   Take 10 mg by mouth at bedtime.          Marland Kitchen VITAMIN D-3 PO   Oral   Take 1 capsule by mouth daily.         Marland Kitchen CIPROFLOXACIN HCL 500 MG PO TABS  Oral   Take 500 mg by mouth daily.         . CO Q 10 PO   Oral   Take 1 tablet by mouth daily.          . B-12 PO   Oral   Take 1 tablet by mouth daily.         Marland Kitchen EZETIMIBE 10 MG PO TABS   Oral   Take 5 mg by mouth daily.         . FENOFIBRATE 48 MG PO TABS   Oral   Take 48 mg by mouth every evening.          Marland Kitchen OMEGA-3 FATTY ACIDS 1000 MG PO CAPS   Oral   Take 1 g by mouth 2 (two) times daily.          Marland Kitchen FLAX SEED OIL PO   Oral   Take 1 capsule by mouth 2 (two) times daily.          Marland Kitchen FLUTICASONE PROPIONATE 50 MCG/ACT NA SUSP   Nasal   Place 2 sprays into the nose daily as needed. ALLERGIES         . FUROSEMIDE 20 MG PO TABS   Oral   Take 20 mg by mouth every other day. IN THE AM         . GLUCOSAMINE HCL PO   Oral   Take 1,500 mg by mouth daily.          Carma Leaven M PLUS PO TABS   Oral   Take 1 tablet by mouth daily.          . NEBIVOLOL HCL 20 MG PO TABS   Oral   Take 1 tablet by mouth at bedtime.          Marland Kitchen POTASSIUM CHLORIDE 10 MEQ PO TBCR   Oral   Take 10 mEq by mouth 3 (three) times a week.          Marland Kitchen B-6 PO   Oral   Take 1 tablet by mouth 2  (two) times daily.         . SULFINPYRAZONE PO   Oral   Take 200 mg by mouth every morning.            BP 127/53  Pulse 61  Temp 99 F (37.2 C) (Oral)  Resp 18  Ht 5\' 10"  (1.778 m)  Wt 180 lb (81.647 kg)  BMI 25.83 kg/m2  SpO2 97%  Physical Exam  Nursing note and vitals reviewed. Constitutional: He is oriented to person, place, and time. He appears well-developed and well-nourished.  Non-toxic appearance. No distress.  HENT:  Head: Normocephalic and atraumatic.  Eyes: Conjunctivae normal, EOM and lids are normal. Pupils are equal, round, and reactive to light.  Neck: Normal range of motion. Neck supple. No tracheal deviation present. No mass present.  Cardiovascular: Normal rate, regular rhythm and normal heart sounds.  Exam reveals no gallop.   No murmur heard. Pulmonary/Chest: Effort normal and breath sounds normal. No stridor. No respiratory distress. He has no decreased breath sounds. He has no wheezes. He has no rhonchi. He has no rales.  Abdominal: Soft. Normal appearance and bowel sounds are normal. He exhibits no distension. There is tenderness in the suprapubic area. There is no rebound and no CVA tenderness.  Musculoskeletal: Normal range of motion. He exhibits no edema and no tenderness.  Neurological: He is alert and oriented to person, place, and  time. He has normal strength. No cranial nerve deficit or sensory deficit. GCS eye subscore is 4. GCS verbal subscore is 5. GCS motor subscore is 6.  Skin: Skin is warm and dry. No abrasion and no rash noted.  Psychiatric: He has a normal mood and affect. His speech is normal and behavior is normal.    ED Course  Procedures (including critical care time)  Labs Reviewed - No data to display No results found.   No diagnosis found.    MDM  Spoke with dr. Kathrynn Running and he will come see the pt        Toy Baker, MD 07/24/12 2111

## 2012-07-24 NOTE — ED Notes (Addendum)
As pt was getting changed into hospital gown, he states he felt bladder spasms and pushed resulting in a blood clot coming through the catheter and subsequent bloody urine drainage.  Pt states this is the first urine return since 5pm this evening.

## 2012-07-24 NOTE — H&P (Signed)
Lance Martin is an 78 y.o. male.   Chief Complaint: Hematuria, Clot Retention, Prostate Cancer HPI:   1 -  Hematuria, Clot Retention, Prostate Cancer - Pt with Gleason 7 prostate cancer diagnosed 2004 by Dr. Logan Bores and treated with primary radiotherapy. Initial PSA 4.1. He has had problems with intermittent gross hematuria on / off for several years, especially since 2011 at which time he had negative eval with cysto and axial imaging. He now presents with another episode of gross blood. He was seen twice in our office earlier today and found to be in clot retention and irrigated and sent out with foley. He called physician line tonight with symptoms of recurrent clot retention and told to come to ER. No fevers / n/v. Last meal was dinner.  NO CV disease. ASA only at baseline which he stopped several days ago.   Past Medical History  Diagnosis Date  . Hypertension   . Hematuria   . Hx of subdural hematoma 08-14-2011    s/p total left hip  12-92012---  resolved w/ no residual  . History of basal cell carcinoma excision 08-29-2011  left frontal scalp  . History of prostate cancer 2004   -  RX EXTERNAL RADIATION  . History of gout PER PT -  STABLE  . Arthritis   . Hyperlipidemia     Past Surgical History  Procedure Date  . Left elbow surgery 2009    ELBOW AND WRIST SURGERY  . Eye surgery 2009    RIGHT CATARACT EXTRACTION  . Tonsillectomy     AGE 51  . Total hip arthroplasty 07/31/2011    Procedure: TOTAL HIP ARTHROPLASTY;  Surgeon: Gus Rankin Aluisio;  Location: [facility];  Service: Orthopedics;  Laterality: Left;  . Total hip arthroplasty   . Right elbow surgery MAR 2013  . Cardiac catheterization   . Cystoscopy with biopsy 07/03/2012    Procedure: CYSTOSCOPY WITH BIOPSY;  Surgeon: Marcine Matar, MD;  Location: [facility];  Service: Urology;  Laterality: N/A;        No family history on file. Social History:  reports that he has never smoked. He has never used  smokeless tobacco. He reports that he drinks about 8.4 ounces of alcohol per week. He reports that he does not use illicit drugs.  Allergies:  Allergies  Allergen Reactions  . Allopurinol Hives and Other (See Comments)    Cause renal failure   . Ampicillin Diarrhea and Nausea And Vomiting     (Not in a hospital admission)  No results found for this or any previous visit (from the past 48 hour(s)). No results found.  Review of Systems  Constitutional: Negative.  Negative for fever and chills.  HENT: Negative.   Eyes: Negative.   Respiratory: Negative.   Cardiovascular: Negative.   Gastrointestinal: Negative.   Genitourinary: Positive for dysuria, urgency, frequency and hematuria.  Musculoskeletal: Negative.   Skin: Negative.   Neurological: Negative.   Endo/Heme/Allergies: Negative.   Psychiatric/Behavioral: Negative.     Blood pressure 127/53, pulse 61, temperature 99 F (37.2 C), temperature source Oral, resp. rate 18, height 5\' 10"  (1.778 m), weight 81.647 kg (180 lb), SpO2 97.00%. Physical Exam  Constitutional: He is oriented to person, place, and time. He appears well-developed and well-nourished.  HENT:  Head: Normocephalic and atraumatic.  Eyes: EOM are normal. Pupils are equal, round, and reactive to light.  Neck: Normal range of motion. Neck supple.  Cardiovascular: Normal rate and regular rhythm.  Respiratory: Effort normal and breath sounds normal.  GI: Soft. Bowel sounds are normal.  Genitourinary: Penis normal.       Foley in place with formed clot in lumen and at meatus.  Musculoskeletal: Normal range of motion.  Neurological: He is alert and oriented to person, place, and time.  Skin: Skin is warm and dry.  Psychiatric: He has a normal mood and affect. His behavior is normal. Judgment and thought content normal.     Assessment/Plan 1 -  Hematuria, Clot Retention, Prostate Cancer - Clinical picture c/w probable bleeding from radiation cystitis.  Exchanged for 59F foley in ER and hand-irrigated with 3L NS to light pink and clot free and placed on continuous irrigation with NS. Plan for OR tomorrow for formal endoscopic exam / clot evac / fulguration of probable radiation cystitis. Risks including bleeding, infection, damage to bladder discussed.  Admit in interim. Continue CBI. Proph ABX. NPO.  Cambridge Deleo 07/24/2012, 10:00 PM

## 2012-07-24 NOTE — ED Notes (Signed)
Pt states indwelling foley cath not draining, pt experiencing suprapubic pressure. Pt states he was seen at [facility] x 2 today, had bladder irrigation, pt becoming clogged with clotting.PWD

## 2012-07-25 ENCOUNTER — Encounter (HOSPITAL_COMMUNITY)
Admission: EM | Disposition: A | Discharge status: Home / Self Care | Payer: Self-pay | Source: Home / Self Care | Attending: Urology

## 2012-07-25 ENCOUNTER — Inpatient Hospital Stay (HOSPITAL_COMMUNITY): Payer: Medicare Other | Admitting: Anesthesiology

## 2012-07-25 ENCOUNTER — Encounter (HOSPITAL_COMMUNITY): Payer: Self-pay | Admitting: Anesthesiology

## 2012-07-25 DIAGNOSIS — N3040 Irradiation cystitis without hematuria: Secondary | ICD-10-CM | POA: Diagnosis not present

## 2012-07-25 DIAGNOSIS — N3289 Other specified disorders of bladder: Secondary | ICD-10-CM | POA: Diagnosis not present

## 2012-07-25 DIAGNOSIS — M109 Gout, unspecified: Secondary | ICD-10-CM | POA: Diagnosis not present

## 2012-07-25 DIAGNOSIS — I10 Essential (primary) hypertension: Secondary | ICD-10-CM | POA: Diagnosis not present

## 2012-07-25 DIAGNOSIS — R319 Hematuria, unspecified: Secondary | ICD-10-CM | POA: Diagnosis not present

## 2012-07-25 HISTORY — PX: TRANSURETHRAL RESECTION OF PROSTATE: SHX73

## 2012-07-25 SURGERY — TRANSURETHRAL RESECTION OF THE PROSTATE WITH GYRUS INSTRUMENTS
Anesthesia: General | Site: Urethra | Wound class: Clean Contaminated

## 2012-07-25 MED ORDER — BELLADONNA ALKALOIDS-OPIUM 16.2-60 MG RE SUPP
RECTAL | Status: AC
  Filled 2012-07-25: qty 1

## 2012-07-25 MED ORDER — ATORVASTATIN CALCIUM 20 MG PO TABS
20.0000 mg | ORAL_TABLET | Freq: Every day | ORAL | Status: DC
  Administered 2012-07-25 (×2): 20 mg via ORAL
  Filled 2012-07-25 (×3): qty 1

## 2012-07-25 MED ORDER — FENTANYL CITRATE 0.05 MG/ML IJ SOLN
INTRAMUSCULAR | Status: DC | PRN
  Administered 2012-07-25: 100 ug via INTRAVENOUS

## 2012-07-25 MED ORDER — OXYBUTYNIN CHLORIDE 5 MG PO TABS
5.0000 mg | ORAL_TABLET | Freq: Three times a day (TID) | ORAL | Status: DC | PRN
  Administered 2012-07-25 (×2): 5 mg via ORAL
  Filled 2012-07-25 (×2): qty 1

## 2012-07-25 MED ORDER — CEFAZOLIN SODIUM 1-5 GM-% IV SOLN
2.0000 g | Freq: Once | INTRAVENOUS | Status: AC
  Administered 2012-07-25: 2 g via INTRAVENOUS
  Filled 2012-07-25: qty 100

## 2012-07-25 MED ORDER — NEBIVOLOL HCL 10 MG PO TABS
20.0000 mg | ORAL_TABLET | Freq: Every day | ORAL | Status: DC
  Administered 2012-07-25 (×2): 20 mg via ORAL
  Filled 2012-07-25 (×3): qty 2

## 2012-07-25 MED ORDER — PHENYLEPHRINE HCL 10 MG/ML IJ SOLN
INTRAMUSCULAR | Status: DC | PRN
  Administered 2012-07-25: 40 ug via INTRAVENOUS
  Administered 2012-07-25: 80 ug via INTRAVENOUS
  Administered 2012-07-25 (×2): 40 ug via INTRAVENOUS

## 2012-07-25 MED ORDER — CEFAZOLIN SODIUM-DEXTROSE 2-3 GM-% IV SOLR
INTRAVENOUS | Status: AC
  Filled 2012-07-25: qty 50

## 2012-07-25 MED ORDER — OXYCODONE-ACETAMINOPHEN 5-325 MG PO TABS
1.0000 | ORAL_TABLET | ORAL | Status: DC | PRN
  Administered 2012-07-25: 2 via ORAL
  Administered 2012-07-25: 1 via ORAL
  Filled 2012-07-25: qty 2
  Filled 2012-07-25: qty 1

## 2012-07-25 MED ORDER — 0.9 % SODIUM CHLORIDE (POUR BTL) OPTIME
TOPICAL | Status: DC | PRN
  Administered 2012-07-25: 1000 mL

## 2012-07-25 MED ORDER — PROPOFOL 10 MG/ML IV BOLUS
INTRAVENOUS | Status: DC | PRN
  Administered 2012-07-25: 120 mg via INTRAVENOUS

## 2012-07-25 MED ORDER — EZETIMIBE 10 MG PO TABS
5.0000 mg | ORAL_TABLET | Freq: Every day | ORAL | Status: DC
  Administered 2012-07-25: 5 mg
  Filled 2012-07-25 (×2): qty 0.5

## 2012-07-25 MED ORDER — ONDANSETRON HCL 4 MG/2ML IJ SOLN
INTRAMUSCULAR | Status: DC | PRN
  Administered 2012-07-25: 4 mg via INTRAVENOUS

## 2012-07-25 MED ORDER — BENAZEPRIL HCL 40 MG PO TABS
40.0000 mg | ORAL_TABLET | Freq: Every day | ORAL | Status: DC
  Administered 2012-07-25 (×2): 40 mg via ORAL
  Filled 2012-07-25 (×3): qty 1

## 2012-07-25 MED ORDER — KCL IN DEXTROSE-NACL 10-5-0.45 MEQ/L-%-% IV SOLN
INTRAVENOUS | Status: DC
  Administered 2012-07-25 (×2): via INTRAVENOUS
  Filled 2012-07-25 (×3): qty 1000

## 2012-07-25 MED ORDER — SENNA 8.6 MG PO TABS
1.0000 | ORAL_TABLET | Freq: Two times a day (BID) | ORAL | Status: DC
  Administered 2012-07-25 (×2): 8.6 mg via ORAL
  Filled 2012-07-25 (×2): qty 1

## 2012-07-25 MED ORDER — LIDOCAINE HCL 2 % EX GEL
CUTANEOUS | Status: AC
  Filled 2012-07-25: qty 10

## 2012-07-25 MED ORDER — FENTANYL CITRATE 0.05 MG/ML IJ SOLN
INTRAMUSCULAR | Status: AC
  Filled 2012-07-25: qty 2

## 2012-07-25 MED ORDER — SODIUM CHLORIDE 0.9 % IR SOLN
Status: DC | PRN
  Administered 2012-07-25: 9000 mL

## 2012-07-25 MED ORDER — DOCUSATE SODIUM 100 MG PO CAPS
100.0000 mg | ORAL_CAPSULE | Freq: Two times a day (BID) | ORAL | Status: DC
  Administered 2012-07-25 (×2): 100 mg via ORAL
  Filled 2012-07-25 (×5): qty 1

## 2012-07-25 MED ORDER — FENTANYL CITRATE 0.05 MG/ML IJ SOLN
25.0000 ug | INTRAMUSCULAR | Status: DC | PRN
  Administered 2012-07-25 (×3): 25 ug via INTRAVENOUS

## 2012-07-25 MED ORDER — LACTATED RINGERS IV SOLN
INTRAVENOUS | Status: DC | PRN
  Administered 2012-07-25: 08:00:00 via INTRAVENOUS

## 2012-07-25 MED ORDER — ONDANSETRON HCL 4 MG/2ML IJ SOLN: 4.0000 mg | INTRAMUSCULAR | Status: DC | PRN

## 2012-07-25 MED ORDER — HYDROMORPHONE HCL PF 1 MG/ML IJ SOLN
0.5000 mg | INTRAMUSCULAR | Status: DC | PRN
  Administered 2012-07-25: 1 mg via INTRAVENOUS
  Filled 2012-07-25: qty 1

## 2012-07-25 MED ORDER — PROMETHAZINE HCL 25 MG/ML IJ SOLN: 6.2500 mg | INTRAMUSCULAR | Status: DC | PRN

## 2012-07-25 MED ORDER — SULFAMETHOXAZOLE-TMP DS 800-160 MG PO TABS
1.0000 | ORAL_TABLET | Freq: Two times a day (BID) | ORAL | Status: DC
  Administered 2012-07-25 (×3): 1 via ORAL
  Filled 2012-07-25 (×5): qty 1

## 2012-07-25 MED ORDER — AMLODIPINE BESYLATE 5 MG PO TABS
5.0000 mg | ORAL_TABLET | Freq: Every evening | ORAL | Status: DC
  Filled 2012-07-25 (×2): qty 1

## 2012-07-25 SURGICAL SUPPLY — 21 items
BAG URINE DRAINAGE (UROLOGICAL SUPPLIES) ×2 IMPLANT
BAG URO CATCHER STRL LF (DRAPE) ×2 IMPLANT
BLADE SURG 15 STRL LF DISP TIS (BLADE) IMPLANT
BLADE SURG 15 STRL SS (BLADE)
CATH FOLEY 3WAY 30CC 22FR (CATHETERS) ×2 IMPLANT
CLOTH BEACON ORANGE TIMEOUT ST (SAFETY) ×2 IMPLANT
DRAPE CAMERA CLOSED 9X96 (DRAPES) ×2 IMPLANT
ELECT LOOP MED HF 24F 12D (CUTTING LOOP) ×1 IMPLANT
ELECT LOOP MED HF 24F 12D CBL (CLIP) ×1 IMPLANT
ELECT RESECT VAPORIZE 12D CBL (ELECTRODE) ×2 IMPLANT
GLOVE BIOGEL M STRL SZ7.5 (GLOVE) ×2 IMPLANT
GOWN STRL REIN XL XLG (GOWN DISPOSABLE) ×3 IMPLANT
HOLDER FOLEY CATH W/STRAP (MISCELLANEOUS) IMPLANT
KIT ASPIRATION TUBING (SET/KITS/TRAYS/PACK) IMPLANT
MANIFOLD NEPTUNE II (INSTRUMENTS) ×2 IMPLANT
PACK CYSTO (CUSTOM PROCEDURE TRAY) ×2 IMPLANT
PLUG CATH AND CAP STER (CATHETERS) ×1 IMPLANT
SUT ETHILON 3 0 PS 1 (SUTURE) IMPLANT
SYR 30ML LL (SYRINGE) ×2 IMPLANT
SYRINGE IRR TOOMEY STRL 70CC (SYRINGE) ×1 IMPLANT
TUBING CONNECTING 10 (TUBING) ×2 IMPLANT

## 2012-07-25 NOTE — Anesthesia Postprocedure Evaluation (Signed)
  Anesthesia Post-op Note  Patient: Lance Martin  Procedure(s) Performed: Procedure(s) (LRB): TRANSURETHRAL RESECTION OF THE PROSTATE WITH GYRUS INSTRUMENTS (N/A)  Patient Location: PACU  Anesthesia Type: General  Level of Consciousness: awake and alert   Airway and Oxygen Therapy: Patient Spontanous Breathing  Post-op Pain: mild  Post-op Assessment: Post-op Vital signs reviewed, Patient's Cardiovascular Status Stable, Respiratory Function Stable, Patent Airway and No signs of Nausea or vomiting  Last Vitals:  Filed Vitals:   07/25/12 1000  BP: 125/56  Pulse: 55  Temp:   Resp: 15    Post-op Vital Signs: stable   Complications: No apparent anesthesia complications

## 2012-07-25 NOTE — Anesthesia Preprocedure Evaluation (Signed)
Anesthesia Evaluation  Patient identified by MRN, date of birth, ID band Patient awake    Reviewed: Allergy & Precautions, H&P , NPO status , Patient's Chart, lab work & pertinent test results  Airway Mallampati: II TM Distance: >3 FB Neck ROM: Full    Dental No notable dental hx.    Pulmonary neg pulmonary ROS,  breath sounds clear to auscultation  Pulmonary exam normal       Cardiovascular hypertension, Pt. on medications Rhythm:Regular Rate:Normal     Neuro/Psych negative neurological ROS  negative psych ROS   GI/Hepatic negative GI ROS, Neg liver ROS,   Endo/Other  negative endocrine ROS  Renal/GU Renal InsufficiencyRenal disease  negative genitourinary   Musculoskeletal negative musculoskeletal ROS (+)   Abdominal   Peds negative pediatric ROS (+)  Hematology negative hematology ROS (+)   Anesthesia Other Findings   Reproductive/Obstetrics negative OB ROS                           Anesthesia Physical Anesthesia Plan  ASA: II  Anesthesia Plan: General   Post-op Pain Management:    Induction: Intravenous  Airway Management Planned: LMA  Additional Equipment:   Intra-op Plan:   Post-operative Plan:   Informed Consent: I have reviewed the patients History and Physical, chart, labs and discussed the procedure including the risks, benefits and alternatives for the proposed anesthesia with the patient or authorized representative who has indicated his/her understanding and acceptance.   Dental advisory given  Plan Discussed with: CRNA and Surgeon  Anesthesia Plan Comments: (5 LMA used previously)        Anesthesia Quick Evaluation

## 2012-07-25 NOTE — Progress Notes (Signed)
Utilization review completed.  

## 2012-07-25 NOTE — Transfer of Care (Signed)
Immediate Anesthesia Transfer of Care Note  Patient: Lance Martin  Procedure(s) Performed: Procedure(s) (LRB) with comments: TRANSURETHRAL RESECTION OF THE PROSTATE WITH GYRUS INSTRUMENTS (N/A) - CYSTOSCOPY;CLOT EVACUATION WITH FULGERATION  Patient Location: PACU  Anesthesia Type:General  Level of Consciousness: awake, alert  and patient cooperative  Airway & Oxygen Therapy: Patient Spontanous Breathing and Patient connected to face mask oxygen  Post-op Assessment: Report given to PACU RN and Post -op Vital signs reviewed and stable  Post vital signs: Reviewed and stable  Complications: No apparent anesthesia complications

## 2012-07-25 NOTE — Op Note (Signed)
NAME:  Lance Martin, Lance Martin NO.:  [id number]  MEDICAL RECORD NO.:  [id number]  LOCATION:  [facility]                         FACILITY:  [facility]  PHYSICIAN:  Sebastian Ache, MD     DATE OF BIRTH:  11/27/1932  DATE OF PROCEDURE:  07/25/2012 DATE OF DISCHARGE:                              OPERATIVE REPORT   DIAGNOSIS:  Refractory hematuria, radiation, cystitis, clot retention.  PROCEDURE: 1. Cystoscopy with clot evacuation. 2. Fulguration of bleeders, volume large.  FINDINGS: 1. Very large volume radiation cystitis changes of bladder     occupying approximately 1/3rd of the surface area, these changes     were completely ablated using bipolar energy. 2. Non involvement of the trigone and a complete sparing of the     ureteral orifices following ablation.  DRAINS:  A 22-French 3-way Foley catheter to straight drain.  Irrigation port plugged.  ESTIMATED BLOOD LOSS:  400 mL of formed clot, 50 mL new blood.  COMPLICATIONS:  None.  SPECIMEN:  None.  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  He has had several bouts of hematuria due to presumed radiation cystitis since then.  He had negative evaluation in 2011.  Earlier this year, he had operative cystoscopy and biopsies which revealed changes consistent with radiation cystitis.  He had recurrent gross hematuria with clot retention yesterday requiring 3 visits to healthcare provider including the ER last night, at which point, he was again irrigated by myself to clear and I counseled him towards operative evaluation in the morning.  He wished to proceed with cystoscopy clot evacuation, fulguration.  Informed consent was obtained and placed in medical record.  PROCEDURE IN DETAIL:  The patient being Dr. Cheyenne Adas, was verified. Procedure being cystoscopy, clot evacuation, and fulguration  was confirmed.  Procedure was carried out.  Time-out was performed. Intravenous antibiotics administered.  General LMA anesthesia was introduced.  The patient was placed into a low lithotomy position. Sterile field was created by prepping and draping the patient's penis, perineum, and proximal thighs using iodine x3.  Next, cystourethroscopy was performed using a 22-French cystoscope with 30-degrees lens. Inspection of the anterior and posterior tear were unremarkable. Inspection of urinary bladder revealed a very large volume of formed clot within urinary bladder.  Changes consistent with radiation cystitis and all quadrants of the bladder occupying approximately 1/3rd of the surface.  No evidence of bladder perforation was noted.  There was complete sparing of ureteral orifices with this process.  The cystoscope was exchanged for the 28-French ACMI continuous flow resectoscope sheath and approximately 400 mL of formed clot was irrigated from the urinary bladder.  Next, the bipolar button electrode was used to carefully fulgurate all areas of radiation changes in the urinary bladder.  Again, the trigone and ureteral orifices were completely spared and injured. No bladder perforation occurred, this resulted in excellent hemostasis, complete ablation of all radiation cystitis areas.  The resectoscope was exchanged for new 22-French 3-way Foley catheter straight drain.  Efflux was clear.  The irrigation port was plugged and the procedure was terminated.  Patient tolerated procedure well.  .  There were no immediate periprocedural occasions.  The patient was taken to postanesthesia care unit in stable condition.          ______________________________ Sebastian Ache, MD     TM/MEDQ  D:  07/25/2012  T:  07/25/2012  Job:  [id number]

## 2012-07-25 NOTE — OR Nursing (Signed)
Patient arrived with 3-way catheter with saline irrigation which was remove at 0850 by J. Alferd Apa RN

## 2012-07-25 NOTE — Brief Op Note (Signed)
07/24/2012 - 07/25/2012  9:32 AM  PATIENT:  Lance Martin  78 y.o. male  PRE-OPERATIVE DIAGNOSIS:  hematuria  POST-OPERATIVE DIAGNOSIS:  Hematuria, radiation cystitis  PROCEDURE:  Procedure(s) (LRB) with comments: TRANSURETHRAL RESECTION OF THE PROSTATE WITH GYRUS INSTRUMENTS (N/A) - CYSTOSCOPY;CLOT EVACUATION WITH FULGERATION  SURGEON:  Surgeon(s) and Role:    * Sebastian Ache, MD - Primary  PHYSICIAN ASSISTANT:   ASSISTANTS: none   ANESTHESIA:   general  EBL:  Total I/O In: 700 [I.V.:700] Out: 3000 [Urine:3000]  BLOOD ADMINISTERED:none  DRAINS: 29F 3 way foley to straight drain, irrigatio port plugged   LOCAL MEDICATIONS USED:  NONE  SPECIMEN:  No Specimen  DISPOSITION OF SPECIMEN:  N/A  COUNTS:  YES  TOURNIQUET:  * No tourniquets in log *  DICTATION: .Other Dictation: Dictation Number [id number]  PLAN OF CARE: Admit to inpatient   PATIENT DISPOSITION:  PACU - hemodynamically stable.   Delay start of Pharmacological VTE agent (>24hrs) due to surgical blood loss or risk of bleeding: yes

## 2012-07-25 NOTE — Preoperative (Signed)
Beta Blockers   Reason not to administer Beta Blockers:Not Applicable 

## 2012-07-25 NOTE — Progress Notes (Signed)
We receive him from PACU at this time awake, alert and in no distress. A dose of iv fentanyl was given in PACU just before coming back to our floor. He tells me he feels "better", and is encouraged by the clearing of his urine. His only c/o at present is of being hungry.

## 2012-07-26 LAB — BASIC METABOLIC PANEL
BUN: 17 mg/dL (ref 6–23)
Chloride: 103 mEq/L (ref 96–112)
Glucose, Bld: 110 mg/dL — ABNORMAL HIGH (ref 70–99)
Potassium: 4.3 mEq/L (ref 3.5–5.1)
Sodium: 138 mEq/L (ref 135–145)

## 2012-07-26 LAB — URINE CULTURE

## 2012-07-26 LAB — HEMOGLOBIN AND HEMATOCRIT, BLOOD: HCT: 28.1 % — ABNORMAL LOW (ref 39.0–52.0)

## 2012-07-26 MED ORDER — OXYBUTYNIN CHLORIDE 5 MG PO TABS: 5.0000 mg | ORAL_TABLET | Freq: Three times a day (TID) | ORAL | Status: DC | PRN

## 2012-07-26 NOTE — Progress Notes (Signed)
Pt discharged to home.  Discharge instructions explained to patient and copy of instructions given to patient along w/ script for Ditropan.  Pt taken to front entrance by Nursing Assistant via wheel chair.

## 2012-07-26 NOTE — Discharge Summary (Signed)
Physician Discharge Summary  Patient ID: Lance Martin MRN: [id number] DOB/AGE: Oct 21, 1932 78 y.o.  Admit date: 07/24/2012 Discharge date: 07/26/2012  Admission Diagnoses:Hematuria, Clot Retention, Radiation Cystitis  Discharge Diagnoses: Hematuria, Clot Retention, Radiation Cystitis   Discharged Condition: good  Hospital Course:   Pt admitted through ER 07/24/2012 with clot retention from presumed flair of radiation cystitis. He was irrigated via catheter in ER and placed on continuous bladder irrigation overnight and taken to the OR on 12/14 for operative clot evacuation and ablation of radiation cystitis lesions. He was admitted observed overnight post-op. By the early morning of 12/15, the day of discharge, the catheter had been removed and the patient was voiding well. His pain was controlled and he was tolerating a diet. His Hgb was >9 without s/s anemia or active bleeding. His Cr was approximately stable at 2.11. He was felt to be adequate for discharge.  Consults: None  Significant Diagnostic Studies: labs: Hgb >9, Cr 2.11   Treatments: surgery:  operative clot evacuation and ablation of radiation cystitis lesions 12/14  Discharge Exam: Blood pressure 102/59, pulse 58, temperature 98.6 F (37 C), temperature source Oral, resp. rate 16, height 5\' 10"  (1.778 m), weight 81.647 kg (180 lb), SpO2 93.00%. General appearance: alert, cooperative and appears stated age Head: Normocephalic, without obvious abnormality, atraumatic Eyes: conjunctivae/corneas clear. PERRL, EOM's intact. Fundi benign. Ears: normal TM's and external ear canals both ears Nose: Nares normal. Septum midline. Mucosa normal. No drainage or sinus tenderness. Throat: lips, mucosa, and tongue normal; teeth and gums normal Neck: no adenopathy, no carotid bruit, no JVD, supple, symmetrical, trachea midline and thyroid not enlarged, symmetric, no tenderness/mass/nodules Back: symmetric, no curvature. ROM normal. No  CVA tenderness. Resp: clear to auscultation bilaterally Cardio: regular rate and rhythm, S1, S2 normal, no murmur, click, rub or gallop GI: soft, non-tender; bowel sounds normal; no masses,  no organomegaly Male genitalia: normal, foley out. Yellow urine w/o clots in bedside urinal.  Extremities: extremities normal, atraumatic, no cyanosis or edema Pulses: 2+ and symmetric Skin: Skin color, texture, turgor normal. No rashes or lesions Lymph nodes: Cervical, supraclavicular, and axillary nodes normal. Neurologic: Grossly normal  Disposition: 01-Home or Self Care     Medication List     As of 07/26/2012  7:03 AM    STOP taking these medications         GLUCOSAMINE HCL PO      TAKE these medications         amLODipine 5 MG tablet   Commonly known as: NORVASC   Take 5 mg by mouth every evening.      aspirin 81 MG tablet   Take 81 mg by mouth daily.      atorvastatin 20 MG tablet   Commonly known as: LIPITOR   Take 20 mg by mouth at bedtime.      B-12 PO   Take 1 tablet by mouth daily.      B-6 PO   Take 1 tablet by mouth 2 (two) times daily.      benazepril 40 MG tablet   Commonly known as: LOTENSIN   Take 40 mg by mouth at bedtime.      BYSTOLIC 20 MG Tabs   Generic drug: Nebivolol HCl   Take 1 tablet by mouth at bedtime.      CALCIUM CITRATE + D PO   Take 1 tablet by mouth daily.      cetirizine 10 MG tablet   Commonly known as: ZYRTEC  Take 10 mg by mouth at bedtime.      ciprofloxacin 500 MG tablet   Commonly known as: CIPRO   Take 500 mg by mouth daily.      CO Q 10 PO   Take 1 tablet by mouth daily.      ezetimibe 10 MG tablet   Commonly known as: ZETIA   Take 5 mg by mouth daily.      fenofibrate 48 MG tablet   Commonly known as: TRICOR   Take 48 mg by mouth every evening.      fish oil-omega-3 fatty acids 1000 MG capsule   Take 1 g by mouth 2 (two) times daily.      FLAX SEED OIL PO   Take 1 capsule by mouth 2 (two) times daily.       fluticasone 50 MCG/ACT nasal spray   Commonly known as: FLONASE   Place 2 sprays into the nose daily as needed. ALLERGIES      furosemide 20 MG tablet   Commonly known as: LASIX   Take 20 mg by mouth every other day. IN THE AM      multivitamins ther. w/minerals Tabs   Take 1 tablet by mouth daily.      oxybutynin 5 MG tablet   Commonly known as: DITROPAN   Take 1 tablet (5 mg total) by mouth every 8 (eight) hours as needed. For bladder spasms      potassium chloride 10 MEQ CR tablet   Commonly known as: KLOR-CON   Take 10 mEq by mouth 3 (three) times a week.      SULFINPYRAZONE PO   Take 200 mg by mouth every morning.      VITAMIN D-3 PO   Take 1 capsule by mouth daily.           Follow-up Information    Follow up with Chelsea Aus, MD. (as scheduld in about 6 weeks. You may cancel the [place] appt if you are voiding well. )    Contact information:   [address] [place] [address] [phone]          Signed: Sebastian Ache 07/26/2012, 7:03 AM

## 2012-07-27 ENCOUNTER — Encounter (HOSPITAL_COMMUNITY): Payer: Self-pay | Admitting: Urology

## 2012-08-14 DIAGNOSIS — C61 Malignant neoplasm of prostate: Secondary | ICD-10-CM | POA: Diagnosis not present

## 2012-08-14 DIAGNOSIS — R310 Gross hematuria: Secondary | ICD-10-CM | POA: Diagnosis not present

## 2012-08-14 DIAGNOSIS — R300 Dysuria: Secondary | ICD-10-CM | POA: Diagnosis not present

## 2012-10-23 DIAGNOSIS — M76899 Other specified enthesopathies of unspecified lower limb, excluding foot: Secondary | ICD-10-CM | POA: Diagnosis not present

## 2012-10-30 DIAGNOSIS — M76899 Other specified enthesopathies of unspecified lower limb, excluding foot: Secondary | ICD-10-CM | POA: Diagnosis not present

## 2012-11-04 DIAGNOSIS — M76899 Other specified enthesopathies of unspecified lower limb, excluding foot: Secondary | ICD-10-CM | POA: Diagnosis not present

## 2012-11-06 DIAGNOSIS — M76899 Other specified enthesopathies of unspecified lower limb, excluding foot: Secondary | ICD-10-CM | POA: Diagnosis not present

## 2012-11-10 DIAGNOSIS — M76899 Other specified enthesopathies of unspecified lower limb, excluding foot: Secondary | ICD-10-CM | POA: Diagnosis not present

## 2012-11-13 DIAGNOSIS — M76899 Other specified enthesopathies of unspecified lower limb, excluding foot: Secondary | ICD-10-CM | POA: Diagnosis not present

## 2012-11-16 DIAGNOSIS — M76899 Other specified enthesopathies of unspecified lower limb, excluding foot: Secondary | ICD-10-CM | POA: Diagnosis not present

## 2012-11-18 DIAGNOSIS — M76899 Other specified enthesopathies of unspecified lower limb, excluding foot: Secondary | ICD-10-CM | POA: Diagnosis not present

## 2012-11-24 DIAGNOSIS — M76899 Other specified enthesopathies of unspecified lower limb, excluding foot: Secondary | ICD-10-CM | POA: Diagnosis not present

## 2012-11-25 DIAGNOSIS — Z85828 Personal history of other malignant neoplasm of skin: Secondary | ICD-10-CM | POA: Diagnosis not present

## 2012-12-14 DIAGNOSIS — R82998 Other abnormal findings in urine: Secondary | ICD-10-CM | POA: Diagnosis not present

## 2012-12-14 DIAGNOSIS — E785 Hyperlipidemia, unspecified: Secondary | ICD-10-CM | POA: Diagnosis not present

## 2012-12-14 DIAGNOSIS — I10 Essential (primary) hypertension: Secondary | ICD-10-CM | POA: Diagnosis not present

## 2012-12-14 DIAGNOSIS — M109 Gout, unspecified: Secondary | ICD-10-CM | POA: Diagnosis not present

## 2012-12-14 DIAGNOSIS — R7301 Impaired fasting glucose: Secondary | ICD-10-CM | POA: Diagnosis not present

## 2012-12-14 DIAGNOSIS — I739 Peripheral vascular disease, unspecified: Secondary | ICD-10-CM | POA: Diagnosis not present

## 2012-12-14 DIAGNOSIS — Z125 Encounter for screening for malignant neoplasm of prostate: Secondary | ICD-10-CM | POA: Diagnosis not present

## 2012-12-17 DIAGNOSIS — M76899 Other specified enthesopathies of unspecified lower limb, excluding foot: Secondary | ICD-10-CM | POA: Diagnosis not present

## 2012-12-17 DIAGNOSIS — D539 Nutritional anemia, unspecified: Secondary | ICD-10-CM | POA: Diagnosis not present

## 2012-12-17 DIAGNOSIS — Z96649 Presence of unspecified artificial hip joint: Secondary | ICD-10-CM | POA: Diagnosis not present

## 2012-12-21 DIAGNOSIS — D7589 Other specified diseases of blood and blood-forming organs: Secondary | ICD-10-CM | POA: Diagnosis not present

## 2012-12-21 DIAGNOSIS — L219 Seborrheic dermatitis, unspecified: Secondary | ICD-10-CM | POA: Diagnosis not present

## 2012-12-21 DIAGNOSIS — Z23 Encounter for immunization: Secondary | ICD-10-CM | POA: Diagnosis not present

## 2012-12-21 DIAGNOSIS — Z0000 Encounter for general adult medical examination without abnormal findings: Secondary | ICD-10-CM | POA: Diagnosis not present

## 2012-12-21 DIAGNOSIS — I440 Atrioventricular block, first degree: Secondary | ICD-10-CM | POA: Diagnosis not present

## 2012-12-21 DIAGNOSIS — R0989 Other specified symptoms and signs involving the circulatory and respiratory systems: Secondary | ICD-10-CM | POA: Diagnosis not present

## 2012-12-21 DIAGNOSIS — R0609 Other forms of dyspnea: Secondary | ICD-10-CM | POA: Diagnosis not present

## 2012-12-21 DIAGNOSIS — R809 Proteinuria, unspecified: Secondary | ICD-10-CM | POA: Diagnosis not present

## 2012-12-21 DIAGNOSIS — Z1331 Encounter for screening for depression: Secondary | ICD-10-CM | POA: Diagnosis not present

## 2012-12-21 DIAGNOSIS — M1990 Unspecified osteoarthritis, unspecified site: Secondary | ICD-10-CM | POA: Diagnosis not present

## 2012-12-21 DIAGNOSIS — I6200 Nontraumatic subdural hemorrhage, unspecified: Secondary | ICD-10-CM | POA: Diagnosis not present

## 2012-12-22 DIAGNOSIS — Z1212 Encounter for screening for malignant neoplasm of rectum: Secondary | ICD-10-CM | POA: Diagnosis not present

## 2013-01-01 ENCOUNTER — Encounter: Payer: Self-pay | Admitting: Neurology

## 2013-01-01 ENCOUNTER — Other Ambulatory Visit: Payer: Self-pay | Admitting: *Deleted

## 2013-01-01 ENCOUNTER — Ambulatory Visit (INDEPENDENT_AMBULATORY_CARE_PROVIDER_SITE_OTHER): Payer: Medicare Other | Admitting: Neurology

## 2013-01-01 VITALS — BP 128/65 | HR 73 | Temp 97.4°F | Ht 70.0 in | Wt 179.0 lb

## 2013-01-01 DIAGNOSIS — I10 Essential (primary) hypertension: Secondary | ICD-10-CM | POA: Diagnosis not present

## 2013-01-01 DIAGNOSIS — G4730 Sleep apnea, unspecified: Secondary | ICD-10-CM | POA: Diagnosis not present

## 2013-01-01 DIAGNOSIS — R0683 Snoring: Secondary | ICD-10-CM

## 2013-01-01 DIAGNOSIS — G4710 Hypersomnia, unspecified: Secondary | ICD-10-CM | POA: Diagnosis not present

## 2013-01-01 DIAGNOSIS — R0609 Other forms of dyspnea: Secondary | ICD-10-CM | POA: Diagnosis not present

## 2013-01-01 DIAGNOSIS — R0902 Hypoxemia: Secondary | ICD-10-CM | POA: Insufficient documentation

## 2013-01-01 DIAGNOSIS — G4700 Insomnia, unspecified: Secondary | ICD-10-CM

## 2013-01-01 DIAGNOSIS — R0989 Other specified symptoms and signs involving the circulatory and respiratory systems: Secondary | ICD-10-CM | POA: Diagnosis not present

## 2013-01-01 MED ORDER — ZOLPIDEM TARTRATE 5 MG PO TABS: 5.0000 mg | ORAL_TABLET | Freq: Every evening | ORAL | Status: DC | PRN

## 2013-01-01 NOTE — Progress Notes (Addendum)
[facility]  Provider:  Dr Sheletha Bow Referring Provider: Ezequiel Kayser, MD Primary Care Physician:  Ezequiel Kayser, MD  Chief Complaint  Patient presents with  . Insomnia    sleep apnea consult#11    HPI:  Lance Martin is a 79 y.o. male here as a referral from Dr. Waynard Edwards for  Sleep evaluation. The Dreisbach is a 79 year old right-handed white male presenting after an abnormal overnight pulse oximetry for further evaluation. The patient was referred by Dr. Waynard Edwards, his PCP based on the following findings. The patient all and all showed a high pulse rate of 88 beats per minute and a lower one of 44 beats per minutes indicating tachybradycardia arrhythmia. The patient slept with at least 23.4 minutes lower than 89% oxygenation by Medicare criteria this is considered borderline and not conclusive. Today this was earlier this year hospitalized for his urinary tract infection he has made a conscious effort to lose weight, but his PCP does want him to lose anymore at this point. He was not considered morbidly obese. His annual physical from May 12 on the proteinuria. This patient has a history of hypertension, CAD, hyperlipidemia, prostate cancer diagnosed 2004, gulch, basal cell carcinoma of the [place] diagnosed 1998 and treated. I FG in 2006, hem orrhoids left hip osteoarthritis and right shoulder impingement syndrome.   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 His wife has reported that he snores especially in supine sleep position, but she has not witnessed apneas. He awakens with a dry mouth normal on in headaches are reported. He has no history of nasal surgeries and no septal deviation. There is also no history of lower jaw surgeries, and he does not wear a retainer or any  device at night. He has very mild retrognathia.  Given the patient's comorbidities, the clinical symptoms, and his oral facial anatomy, I suspect that  obstructive sleep apnea is very likely present. Given the tachybradycardia arrhythmia, this may range and the AHI between 20 and 25. He drinks alcohol nightly.     Review of Systems: Out of a complete 14 system review, the patient complains of only the following symptoms, and all other reviewed systems are negative.  He snores, loud only when he had ETOH. Sleepier, Epworth 12,   The patient suffered a subdural hematoma in 2012, as before his last hip surgery. He has noticed since that his balance is not as good as it used to be, but he reports no falls since.  History   Social History  . Marital Status: Married    Spouse Name: Lance Martin    Number of Children: 2  . Years of Education: N/A   Occupational History  . Dermatologist    Social History Main Topics  . Smoking status: Never Smoker   . Smokeless tobacco: Never Used  . Alcohol Use: 8.4 oz/week    7 Glasses of wine, 7 Shots of liquor per week  . Drug Use: No  . Sexually Active: Not on file   Other Topics Concern  . Not on file   Social History Narrative  . No narrative on file    History reviewed. No pertinent family history.  Past Medical History  Diagnosis Date  . Hypertension   . Hematuria   .  Hx of subdural hematoma 08-14-2011    s/p total left hip  12-92012---  resolved w/ no residual  . History of basal cell carcinoma excision 08-29-2011  left frontal scalp  . History of prostate cancer 2004   -  RX EXTERNAL RADIATION  . History of gout PER PT -  STABLE  . Arthritis   . Hyperlipidemia   . Cancer     prostate  . Gout   . IFG (impaired fasting glucose)   . Hemorrhoid   . OA (osteoarthritis) of hip   . Shoulder impingement syndrome     right  . Microalbuminuria 2011  . AV block 05/13 ecg    1st degree  . Bleeding     bladder,radiation cystitis-s/p  cystoscopy, clot evacuation, fulgeration of bleeders  . Lab findings of hypoxemia     abnormal ONO , dr perini. 05-26-2012    Past Surgical History  Procedure Laterality Date  . Left elbow surgery  2009    ELBOW AND WRIST SURGERY  . Eye surgery  2009    RIGHT CATARACT EXTRACTION  . Tonsillectomy      AGE 52  . Total hip arthroplasty  07/31/2011    Procedure: TOTAL HIP ARTHROPLASTY;  Surgeon: Gus Rankin Aluisio;  Location: [facility];  Service: Orthopedics;  Laterality: Left;  . Total hip arthroplasty    . Right elbow surgery  MAR 2013  . Cardiac catheterization    . Cystoscopy with biopsy  07/03/2012    Procedure: CYSTOSCOPY WITH BIOPSY;  Surgeon: Marcine Matar, MD;  Location: [facility];  Service: Urology;  Laterality: N/A;      . Transurethral resection of prostate  07/25/2012    Procedure: TRANSURETHRAL RESECTION OF THE PROSTATE WITH GYRUS INSTRUMENTS;  Surgeon: Sebastian Ache, MD;  Location: [facility];  Service: Urology;  Laterality: N/A;  CYSTOSCOPY;CLOT EVACUATION WITH FULGERATION  . Ulnar nerve repair  04/13    Current Outpatient Prescriptions  Medication Sig Dispense Refill  . amLODipine (NORVASC) 5 MG tablet Take 2.5 mg by mouth every evening.       Marland Kitchen aspirin 81 MG tablet Take 81 mg by mouth once a week.       Marland Kitchen atorvastatin (LIPITOR) 20 MG tablet Take 20 mg by mouth at bedtime.       . benazepril (LOTENSIN) 40 MG tablet Take 40 mg by mouth at bedtime.       . Calcium Citrate-Vitamin D (CALCIUM CITRATE + D PO) Take 1 tablet by mouth daily.       . cetirizine (ZYRTEC) 10 MG tablet Take 10 mg by mouth at bedtime.       . Cholecalciferol (VITAMIN D-3 PO) Take 1 capsule by mouth daily.      . Coenzyme Q10 (CO Q 10 PO) Take 1 tablet by mouth daily.       . Cyanocobalamin (B-12 PO) Take 1 tablet by mouth daily.      Marland Kitchen ezetimibe (ZETIA) 10 MG tablet Take 5 mg by mouth daily.      . fenofibrate (TRICOR) 48 MG tablet Take 48 mg by mouth every evening.       . fish  oil-omega-3 fatty acids 1000 MG capsule Take 1 g by mouth 2 (two) times daily.       . Flaxseed, Linseed, (FLAX SEED OIL PO) Take 1 capsule by mouth 2 (two) times daily.       . fluticasone (FLONASE) 50 MCG/ACT nasal spray Place 2 sprays into  the nose daily as needed. ALLERGIES      . furosemide (LASIX) 20 MG tablet Take 20 mg by mouth every other day. IN THE AM      . Multiple Vitamins-Minerals (MULTIVITAMINS THER. W/MINERALS) TABS Take 1 tablet by mouth daily.       . Nebivolol HCl (BYSTOLIC) 20 MG TABS Take 1 tablet by mouth at bedtime.       . potassium chloride (KLOR-CON) 10 MEQ CR tablet Take 10 mEq by mouth 3 (three) times a week.       . SULFINPYRAZONE PO Take 200 mg by mouth every morning.       . clindamycin (CLEOCIN) 300 MG capsule        No current facility-administered medications for this visit.    Allergies as of 01/01/2013 - Review Complete 01/01/2013  Allergen Reaction Noted  . Allopurinol Hives and Other (See Comments) 12/29/2007  . Ampicillin Diarrhea and Nausea And Vomiting 06/26/2012    Vitals: BP 128/65  Pulse 73  Temp(Src) 97.4 F (36.3 C) (Oral)  Ht 5\' 10"  (1.778 m)  Wt 179 lb (81.194 kg)  BMI 25.68 kg/m2 Last Weight:  Wt Readings from Last 1 Encounters:  01/01/13 179 lb (81.194 kg)   Last Height:   Ht Readings from Last 1 Encounters:  01/01/13 5\' 10"  (1.778 m)   Vitals :.  Physical exam:  General: The patient is awake, alert and appears not in acute distress. The patient is well groomed. Head: Normocephalic, atraumatic.  Neck is supple. Mallampati  2 neck circumference: 17 Cardiovascular:  Regular rate and rhythm, without  murmurs or carotid bruit, and without distended neck veins. Respiratory: Lungs are clear to auscultation. Skin:  Without evidence of edema, or rash Trunk: BMI is elevated . patient  has a slight propulsion of gait,  posture.  Neurologic exam : The patient is awake and alert, oriented to place and time.  Memory subjective  described as intact. There is a normal attention span & concentration ability. Speech is fluent without dysarthria, dysphonia or aphasia. Mood and affect are appropriate.  Cranial nerves: Pupils are equal and briskly reactive to light. Funduscopic exam without  evidence of pallor or edema. Extraocular movements  in vertical and horizontal planes intact and without nystagmus. Visual fields by finger perimetry are intact. Hearing to finger rub intact.  Facial sensation intact to fine touch. Facial motor strength is symmetric and tongue and uvula move midline.  Motor exam:  Increased  Tone in the right arm and wrist with cog wheeling and resting tremor, handwriting is tremulous.  In the lower extremities there is normal muscle bulk and symmetric normal strength.  i  Sensory:  Fine touch, pinprick and vibration were tested in all extremities. Proprioception is tested in the upper extremities only. This was  normal.  Coordination: Rapid alternating movements in the fingers/hands is tested and normal.  Finger-to-nose maneuver tested and normal without evidence of ataxia, dysmetria . There is mild  Tremor, right dominant.  He has had multiple shoulder and wrist surgeries.   Gait and station: Patient walks without assistive device and is able and assisted stool climb up to the exam table. Strength within normal limits. Stance is stable and normal. Tandem gait is slowed , turns with 4 Steps are unfragmented. Romberg testing is normal.  Deep tendon reflexes: in the  upper and lower extremities are symmetric and intact. Babinski maneuver response is  downgoing.   Assessment:  After physical and neurologic examination, review  of laboratory studies, imaging, neurophysiology testing and pre-existing records, assessment will be reviewed on the problem list.  Plan:  Treatment plan and additional workup will be reviewed under Problem List.   Reduce ETOH  Intake, I will order you a sleep aid.  SPLIT study  ordered, based on ONO and symptoms, EDS, co- morbidities.  Explained sleep apnea risk for stroke and a fibrillation.   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

## 2013-01-01 NOTE — Addendum Note (Signed)
Addended by: Melvyn Novas on: 01/01/2013 11:32 AM   Modules accepted: Orders

## 2013-01-01 NOTE — Patient Instructions (Addendum)
Sleep Apnea Sleep apnea is disorder that affects a person's sleep. A person with sleep apnea has abnormal pauses in their breathing when they sleep. It is hard for them to get a good sleep. This makes a person tired during the day. It also can lead to other physical problems. There are three types of sleep apnea. One type is when breathing stops for a short time because your airway is blocked (obstructive sleep apnea). Another type is when the brain sometimes fails to give the normal signal to breathe to the muscles that control your breathing (central sleep apnea). The third type is a combination of the other two types. HOME CARE  Do not sleep on your back. Try to sleep on your side.  Take all medicine as told by your doctor.  Avoid alcohol, calming medicines (sedatives), and depressant drugs.  Try to lose weight if you are overweight. Talk to your doctor about a healthy weight goal. Your doctor may have you use a device that helps to open your airway. It can help you get the air that you need. It is called a positive airway pressure (PAP) device. There are three types of PAP devices:  Continuous positive airway pressure (CPAP) device.  Nasal expiratory positive airway pressure (EPAP) device.  Bilevel positive airway pressure (BPAP) device. MAKE SURE YOU:  Understand these instructions.  Will watch your condition.  Will get help right away if you are not doing well or get worse. Document Released: 05/07/2008 Document Revised: 07/15/2012 Document Reviewed: 11/30/2011 [facility] Patient Information 2014 [place], [place].   Reduce ETOH.  wait10 hours after taking  Ambien before driving .

## 2013-01-06 DIAGNOSIS — H2510 Age-related nuclear cataract, unspecified eye: Secondary | ICD-10-CM | POA: Diagnosis not present

## 2013-01-06 DIAGNOSIS — H52209 Unspecified astigmatism, unspecified eye: Secondary | ICD-10-CM | POA: Diagnosis not present

## 2013-01-14 ENCOUNTER — Ambulatory Visit (INDEPENDENT_AMBULATORY_CARE_PROVIDER_SITE_OTHER): Payer: Medicare Other | Admitting: Neurology

## 2013-01-14 VITALS — BP 135/55 | HR 59 | Ht 70.0 in | Wt 179.0 lb

## 2013-01-14 DIAGNOSIS — G4710 Hypersomnia, unspecified: Secondary | ICD-10-CM

## 2013-01-14 DIAGNOSIS — G4733 Obstructive sleep apnea (adult) (pediatric): Secondary | ICD-10-CM

## 2013-01-14 DIAGNOSIS — R0683 Snoring: Secondary | ICD-10-CM

## 2013-01-14 DIAGNOSIS — I10 Essential (primary) hypertension: Secondary | ICD-10-CM

## 2013-01-21 DIAGNOSIS — H2510 Age-related nuclear cataract, unspecified eye: Secondary | ICD-10-CM | POA: Diagnosis not present

## 2013-01-21 DIAGNOSIS — H2589 Other age-related cataract: Secondary | ICD-10-CM | POA: Diagnosis not present

## 2013-01-25 ENCOUNTER — Other Ambulatory Visit: Payer: Self-pay | Admitting: Neurology

## 2013-01-25 DIAGNOSIS — G4733 Obstructive sleep apnea (adult) (pediatric): Secondary | ICD-10-CM

## 2013-02-04 ENCOUNTER — Telehealth: Payer: Self-pay | Admitting: *Deleted

## 2013-02-04 NOTE — Telephone Encounter (Signed)
Called patient to discuss sleep study results from 01/14/2013.  Discussed findings, recommendations and follow up care.  Patient understood well and all questions were answered.  He is going to meet with Dr. Vickey Huger tomorrow to discuss results.  I will send his order to [facility] to set up CPAP for him within the next week.

## 2013-02-05 ENCOUNTER — Encounter: Payer: Self-pay | Admitting: Neurology

## 2013-02-05 ENCOUNTER — Ambulatory Visit (INDEPENDENT_AMBULATORY_CARE_PROVIDER_SITE_OTHER): Payer: Medicare Other | Admitting: Neurology

## 2013-02-05 VITALS — BP 106/50 | HR 54 | Temp 99.0°F | Ht 70.0 in | Wt 180.0 lb

## 2013-02-05 DIAGNOSIS — G4733 Obstructive sleep apnea (adult) (pediatric): Secondary | ICD-10-CM

## 2013-02-05 NOTE — Progress Notes (Signed)
Chief Complaint  Patient presents with  . 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 Rodrigo Ran performed an overnight pulse oximetry which showed desaturations as well as tachybrady arrhythmia .  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  His wife has reported that he snores especially in supine sleep position, but she has not witnessed apneas.  He awakens with a dry mouth normal on in headaches are reported. He has no history of nasal surgeries and no septal deviation.  There is also no history of lower jaw surgeries, and he does not wear a retainer or any device at night. He has very mild retrognathia.  Given the patient's comorbidities, the clinical symptoms, and his oral facial anatomy, I suspect that obstructive sleep apnea is very likely present.  Given the tachybradycardia arrhythmia, this may range and the AHI between 20 and 25.  He drinks alcohol nightly.   The patient reports today an Epworth sleepiness score of 7 points. Be discussed his recent split night study he was diagnosed with an AHI of 35.3 most of his apneas were obstructive in nature but the last majority of respiratory events for a hypotony and is, shallow breathing spells. The patient was surprised about the degree of the AHI, and of the desaturation his lowest oxygen saturation was 77% with a total of 37.8 minutes at or below 90% saturation. Titration to 7 cm water short and AHI of 0.  I advised to start CPAP at 6 cm water with an EPR of 1 cm and to use a nasal pillow mask a so-called air fit - P 10.  The patient had rare periodic limb movements of 1.9 per hour of sleep not enough to call this a significant restless movement at night.   General:  The patient is awake, alert and appears not in acute distress. The patient is well groomed.  BP 128/65  Pulse 73  Temp(Src) 97.4 F (36.3 C) (Oral)  Ht 5\' 10"  (1.778 m)  Wt 179 lb (81.194 kg)  BMI 25.68 kg/m2 Last Weight:   Wt Readings from Last 1 Encounters:   01/01/13  179 lb (81.194 kg)      Last Height:    Ht Readings from Last 1 Encounters:   01/01/13  5\' 10"  (1.778 m)   Today's vitals are entered into RV form.  02-05-13 .   Physical exam:   General: The patient is awake, alert and appears not in acute distress. The patient is well groomed. Head: Normocephalic, atraumatic.  Neck is supple. Mallampati  2 neck circumference: 17 Cardiovascular:  Regular rate and rhythm, without  murmurs or carotid bruit, and without distended neck veins. Respiratory: Lungs are clear to auscultation. Skin:  Without evidence of edema, or rash Trunk: BMI is elevated . patient  has a slight propulsion of gait,  posture.   Neurologic exam : The patient is awake and alert, oriented to place and time.  Memory subjective described as intact. There is a normal attention span & concentration ability. Speech is fluent without dysarthria, dysphonia or aphasia. Mood and affect are appropriate.   Cranial nerves: Pupils are equal and briskly reactive to light. Funduscopic  exam without  evidence of pallor or edema. Extraocular movements  in vertical and horizontal planes intact and without nystagmus. Visual fields by finger perimetry are intact. Hearing to finger rub intact.  Facial sensation intact to fine touch. Facial motor strength is symmetric and tongue and uvula move midline.   Motor exam:  Increased  Tone in the right arm and wrist with cog wheeling and resting tremor, handwriting is tremulous.  In the lower extremities there is normal muscle bulk and symmetric normal strength.  i   Sensory:  Fine touch, pinprick and vibration were tested in all extremities. Proprioception is tested in the upper  extremities only. This was  normal.   Coordination: Rapid alternating movements in the fingers/hands is tested and normal.  Finger-to-nose maneuver tested and normal without evidence of ataxia, dysmetria . There is mild  Tremor, right dominant.  He has had multiple shoulder and wrist surgeries.     Gait and station: Patient walks without assistive device and is able and assisted stool climb up to the exam table. Strength within normal limits. Stance is stable and normal. Tandem gait is slowed , turns with 4 Steps are unfragmented. Romberg testing is normal.   Deep tendon reflexes: in the  upper and lower extremities are symmetric and intact. Babinski maneuver response is  downgoing.     Assessment:  After physical and neurologic examination, review of laboratory studies, imaging, neurophysiology testing and pre-existing records, assessment will be reviewed on the problem list.   Plan:  Treatment plan and additional workup will be reviewed under Problem List.     Reduce ETOH  Intake.   SPLIT study ordered, based on ONO and symptoms, EDS, co- morbidities.   Explained sleep apnea risk for stroke and a fibrillation.                                                              Scan on 02/04/2013 2:51 PM by Provider Default, [facility]  Scan on 02/04/2013 2:51 PM .   Orders Placed This Encounter   CPAP        Patient Instructions    Sleep Apnea Sleep apnea is disorder that affects a person's sleep. A person with sleep apnea has abnormal pauses in their breathing when they sleep. It is hard for them to get a good sleep. This makes a person tired during the day. It also can lead to other physical problems. There are three types of sleep apnea. One type is when breathing stops for a short time because your airway is blocked (obstructive sleep apnea). Another type is when the brain sometimes fails to give the normal signal to breathe to the muscles that  control your breathing (central sleep apnea). The third type is a combination of the other two types. HOME CARE Do not sleep on your back. Try to sleep on your side. Take all medicine as told by your doctor. Avoid alcohol, calming medicines (sedatives), and depressant drugs. Try to lose weight if you are overweight. Talk to your doctor about a healthy weight goal. Your doctor may have you use a device that helps to open your airway. It can help you get the air that you need. It is called a positive airway pressure (PAP) device. There are three types of  PAP devices: Continuous positive airway pressure (CPAP) device. Nasal expiratory positive airway pressure (EPAP) device. Bilevel positive airway pressure (BPAP) device. MAKE SURE YOU: Understand these instructions. Will watch your condition. Will get help right away if you are not doing well or get worse. Document Released: 05/07/2008 Document Revised: 07/15/2012 [facility] Patient Information 2014 [place], [place].     Reduce ETOH.  wait10 hours after taking  Ambien before driving .                                       1) OSA with hypopnea dominance ,  CPAP indicated for hypoxemia, which doesn't respond to dental device.  DME will be [facility] .       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 I would have much rather preferred him to be on the machine for a week or 2 as of he can discuss any kind of pitfalls but may have been arising meanwhile. I will have her 30 minute revisit with the patient in 6-8 weeks after he started CPAP for compliance documentation with Medicare guidelines.

## 2013-02-05 NOTE — Patient Instructions (Signed)
sleep hygiene instructions , see NIH " the guide to better sleep>   CPAP and BIPAP CPAP and BIPAP are methods of helping you breathe. CPAP stands for "continuous positive airway pressure." BIPAP stands for "bi-level positive airway pressure." Both CPAP and BIPAP are provided by a small machine with a flexible plastic tube that attaches to a plastic mask that goes over your nose or mouth. Air is blown into your air passages through your nose or mouth. This helps to keep your airways open and helps to keep you breathing well. The amount of pressure that is used to blow the air into your air passages can be set on the machine. The pressure setting is based on your needs. With CPAP, the amount of pressure stays the same while you breathe in and out. With BIPAP, the amount of pressure changes when you inhale and exhale. Your caregiver will recommend whether CPAP or BIPAP would be more helpful for you.  CPAP and BIPAP can be helpful for both adults and children with:  Sleep apnea.  Chronic Obstructive Pulmonary Disease (COPD), a condition like emphysema.  Diseases which weaken the muscles of the chest such as muscular dystrophy or neurological diseases.  Other problems that cause breathing to be weak or difficult. USE OF CPAP OR BIPAP The respiratory therapist or technician will help you get used to wearing the mask. Some people feel claustrophobic (a trapped or closed in feeling) at first, because the mask needs to be fairly snug on your face.   It may help you to get used to the mask gradually, by first holding the mask loosely over your nose or mouth using a low pressure setting on the machine. Gradually the mask can be applied more snugly with increased pressure. You can also gradually increase the amount of time the mask is used.  People with sleep apnea will use the mask and machine at night when they are sleeping. Others, like those with ALS or other breathing difficulties, may need the CPAP  or BIPAP all the time.  If the first mask you try does not fit well, or is uncomfortable, there are other types and sizes that can be tried.  If you tend to breathe through your mouth, a chin strap may be applied to help keep your mouth closed (if you are using a nasal mask).  The CPAP and BIPAP machines have alarms that may sound if the mask comes off or develops a leak.  You should not eat or drink while the CPAP or BIPAP is on. Food or fluids could get pushed into your lungs by the pressure of the CPAP or BIPAP. Sometimes CPAP or BIPAP machines are ordered for home use. If you are going to use the CPAP or BIPAP machine at home, follow these instructions  CPAP or BIPAP machines can be rented or purchased through home health care companies. There are many different brands of machines available. If you rent a machine before purchasing you may find which particular machine works well for you.  Ask questions if there is something you do not understand when picking out your machine.  Place your CPAP or BIPAP machine on a secure table or stand near an electrical outlet.  Know where the On/Off switch is.  Follow your doctor's instructions for how to set the pressure on your machine and when you should use it.  Do not smoke! Tobacco smoke residue can damage the machine. SEEK IMMEDIATE MEDICAL CARE IF:   You  have redness or open areas around your nose or mouth.  You have trouble operating the CPAP or BIPAP machine.  You cannot tolerate wearing the CPAP or BIPAP mask.  You have any questions or concerns. Document Released: 04/26/2004 Document Revised: 10/21/2011 Document Reviewed: 07/26/2008 [facility] Patient Information 2014 [place], [place].

## 2013-02-08 NOTE — Progress Notes (Signed)
See media tab for full report  

## 2013-02-24 ENCOUNTER — Encounter: Payer: Self-pay | Admitting: Internal Medicine

## 2013-02-26 ENCOUNTER — Encounter: Payer: Self-pay | Admitting: Neurology

## 2013-03-12 ENCOUNTER — Encounter: Payer: Self-pay | Admitting: Neurology

## 2013-03-15 ENCOUNTER — Ambulatory Visit (AMBULATORY_SURGERY_CENTER): Payer: Medicare Other | Admitting: *Deleted

## 2013-03-15 VITALS — Ht 70.0 in | Wt 181.8 lb

## 2013-03-15 DIAGNOSIS — Z860100 Personal history of colon polyps, unspecified: Secondary | ICD-10-CM

## 2013-03-15 MED ORDER — MOVIPREP 100 G PO SOLR: ORAL | Status: DC

## 2013-03-15 NOTE — Progress Notes (Signed)
No allergies to eggs or soy. No problems with anesthesia.  

## 2013-03-19 ENCOUNTER — Encounter: Payer: Self-pay | Admitting: Neurology

## 2013-03-19 ENCOUNTER — Ambulatory Visit (INDEPENDENT_AMBULATORY_CARE_PROVIDER_SITE_OTHER): Payer: Medicare Other | Admitting: Neurology

## 2013-03-19 VITALS — BP 123/64 | HR 64 | Temp 98.6°F | Ht 70.0 in | Wt 180.0 lb

## 2013-03-19 DIAGNOSIS — G4733 Obstructive sleep apnea (adult) (pediatric): Secondary | ICD-10-CM | POA: Diagnosis not present

## 2013-03-19 DIAGNOSIS — G4730 Sleep apnea, unspecified: Secondary | ICD-10-CM

## 2013-03-19 HISTORY — DX: Sleep apnea, unspecified: G47.30

## 2013-03-19 NOTE — Progress Notes (Signed)
Chief Complaint  Patient presents with  . Neurologic Problem    follow up sleep .. 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 Rodrigo Ran performed an overnight pulse oximetry which showed desaturations as well as tachybrady arrhythmia .  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  His wife had reported that he snores especially in supine sleep position, but she has not witnessed apneas.  He awoke with a dry mouth normal on in headaches are reported. He had no history of nasal surgeries and no septal deviation.  There is also no history of lower jaw surgeries, and he does not wear a retainer or any device at night. He has very mild retrognathia.  Given the patient's comorbidities, the clinical symptoms, and his oral facial anatomy, I suspect that obstructive sleep apnea is very likely present.  Given the tachybradycardia arrhythmia, this may range and the AHI between 20 and 25.  He drinks alcohol nightly.  His recent split night study he was diagnosed with an AHI of 35.3 most of his apneas were obstructive in nature but the last majority of respiratory events for a hypotony and is, shallow breathing spells. The patient was surprised about the degree of the AHI, and of the desaturation his lowest oxygen saturation was 77% with a total of 37.8 minutes at or below 90% saturation. Titration to 7 cm water short and AHI of 0.  I advised to start CPAP at 6 cm water with an EPR of 1 cm and to use a nasal pillow mask a so-called air fit - P 10.  The patient had rare periodic limb movements of 1.9 per hour of sleep not enough to call this a significant restless movement at night.   03-19-13 Dr. Earlene Plater has now been using the prescribed CPAP machine since 02/09/2013, so  this visit takes place within the first 8 weeks of CPAP therapy. A download of data from this machine dated 03-10-13 and Doris in 97% compliance, as they're at the time of 5 hours and 22 minutes at night on CPAP at on the 6 cm water. Residual AHI was 0.6 today's Epworth sleepiness score isn't was at 4 points, fatigue severity is still elevated at 41 points. The geriatric depression score was 0 points. But today was reported his sleep habits have not changed he still goes to bed at the same time his wife has been appreciative of him not snoring, and he rises at the same time. Since his Epworth sleepiness score is 8 points lower than prior to the therapy, he feels more refreshed.   There has been no noticeable difference in his cognitive function since starting CPAP therapy, but this was not a chief complaint to begin with.    Physical exam:  General: The patient is awake, alert and appears not in acute distress. The patient is well groomed.  Head: Normocephalic, atraumatic.  Neck is supple. Mallampati 2 neck circumference: 17 Cardiovascular: Regular rate and rhythm, without murmurs or carotid bruit, and without distended neck veins.  Respiratory: Lungs are clear to auscultation.  Skin: Without evidence of edema, or rash  Trunk: BMI is elevated . patient has a slight propulsion of gait, posture.  Neurologic exam :  The  patient is awake and alert, oriented to place and time. Memory subjective described as intact. There is a normal attention span & concentration ability. Speech is fluent without dysarthria, dysphonia or aphasia. Mood and affect are appropriate.  Cranial nerves:  Pupils are equal and briskly reactive to light. Hearing to finger rub intact. Facial sensation intact to fine touch. Facial motor strength is symmetric and tongue and uvula move midline.  Motor exam: Increased Tone in the right arm and wrist with cog wheeling and resting tremor, handwriting is tremulous.  In the lower  extremities there is normal muscle bulk and symmetric normal strength.   Sensory: This was normal.  Coordination:  normal.  . There is mild Tremor, right dominant. He has had multiple shoulder and wrist surgeries.  Gait and station: Patient walks without assistive device  He favors the left leg,  noticeable limp. Bursitis, after hip replacement.   Deep tendon reflexes: in the upper and lower extremities are symmetric and intact.   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 He was noticed to have tachybradycardia when he underwent a pulse oximetry on the past doing a sleep study however this was much less evident after CPAP was initiated. His Epworth sleepiness score has decreased from 12 to  4 points he no longer has a dry mouth in the morning and longer is audibly snoring.  I would like for him to continue with CPAP use at the current level he has a very high compliance rate there is no indication of any setting changes to be done. He is allowed and encouraged to use  Zyrtec and Flonase for the summer season. [facility] is his DME.

## 2013-03-19 NOTE — Patient Instructions (Signed)
CPAP and BIPAP CPAP and BIPAP are methods of helping you breathe. CPAP stands for "continuous positive airway pressure." BIPAP stands for "bi-level positive airway pressure." Both CPAP and BIPAP are provided by a small machine with a flexible plastic tube that attaches to a plastic mask that goes over your nose or mouth. Air is blown into your air passages through your nose or mouth. This helps to keep your airways open and helps to keep you breathing well. The amount of pressure that is used to blow the air into your air passages can be set on the machine. The pressure setting is based on your needs. With CPAP, the amount of pressure stays the same while you breathe in and out. With BIPAP, the amount of pressure changes when you inhale and exhale. Your caregiver will recommend whether CPAP or BIPAP would be more helpful for you.  CPAP and BIPAP can be helpful for both adults and children with:  Sleep apnea.  Chronic Obstructive Pulmonary Disease (COPD), a condition like emphysema.  Diseases which weaken the muscles of the chest such as muscular dystrophy or neurological diseases.  Other problems that cause breathing to be weak or difficult. USE OF CPAP OR BIPAP The respiratory therapist or technician will help you get used to wearing the mask. Some people feel claustrophobic (a trapped or closed in feeling) at first, because the mask needs to be fairly snug on your face.   It may help you to get used to the mask gradually, by first holding the mask loosely over your nose or mouth using a low pressure setting on the machine. Gradually the mask can be applied more snugly with increased pressure. You can also gradually increase the amount of time the mask is used.  People with sleep apnea will use the mask and machine at night when they are sleeping. Others, like those with ALS or other breathing difficulties, may need the CPAP or BIPAP all the time.  If the first mask you try does not fit well, or  is uncomfortable, there are other types and sizes that can be tried.  If you tend to breathe through your mouth, a chin strap may be applied to help keep your mouth closed (if you are using a nasal mask).  The CPAP and BIPAP machines have alarms that may sound if the mask comes off or develops a leak.  You should not eat or drink while the CPAP or BIPAP is on. Food or fluids could get pushed into your lungs by the pressure of the CPAP or BIPAP. Sometimes CPAP or BIPAP machines are ordered for home use. If you are going to use the CPAP or BIPAP machine at home, follow these instructions  CPAP or BIPAP machines can be rented or purchased through home health care companies. There are many different brands of machines available. If you rent a machine before purchasing you may find which particular machine works well for you.  Ask questions if there is something you do not understand when picking out your machine.  Place your CPAP or BIPAP machine on a secure table or stand near an electrical outlet.  Know where the On/Off switch is.  Follow your doctor's instructions for how to set the pressure on your machine and when you should use it.  Do not smoke! Tobacco smoke residue can damage the machine. SEEK IMMEDIATE MEDICAL CARE IF:   You have redness or open areas around your nose or mouth.  You have trouble operating   the CPAP or BIPAP machine.  You cannot tolerate wearing the CPAP or BIPAP mask.  You have any questions or concerns. Document Released: 04/26/2004 Document Revised: 10/21/2011 Document Reviewed: 07/26/2008 [employer] Patient Information 2014 [employer].  

## 2013-03-30 ENCOUNTER — Encounter: Payer: Self-pay | Admitting: Internal Medicine

## 2013-03-30 ENCOUNTER — Ambulatory Visit (AMBULATORY_SURGERY_CENTER): Payer: Medicare Other | Admitting: Internal Medicine

## 2013-03-30 VITALS — BP 136/71 | HR 62 | Temp 97.8°F | Resp 18 | Ht 70.0 in | Wt 181.0 lb

## 2013-03-30 DIAGNOSIS — Z860100 Personal history of colon polyps, unspecified: Secondary | ICD-10-CM

## 2013-03-30 DIAGNOSIS — I10 Essential (primary) hypertension: Secondary | ICD-10-CM | POA: Diagnosis not present

## 2013-03-30 DIAGNOSIS — D126 Benign neoplasm of colon, unspecified: Secondary | ICD-10-CM

## 2013-03-30 DIAGNOSIS — G4733 Obstructive sleep apnea (adult) (pediatric): Secondary | ICD-10-CM | POA: Diagnosis not present

## 2013-03-30 DIAGNOSIS — E785 Hyperlipidemia, unspecified: Secondary | ICD-10-CM | POA: Diagnosis not present

## 2013-03-30 MED ORDER — SODIUM CHLORIDE 0.9 % IV SOLN: 500.0000 mL | INTRAVENOUS | Status: DC

## 2013-03-30 NOTE — Patient Instructions (Addendum)

## 2013-03-30 NOTE — Progress Notes (Signed)
Patient did not have preoperative order for IV antibiotic SSI prophylaxis. (G8918)  Patient did not experience any of the following events: a burn prior to discharge; a fall within the facility; wrong site/side/patient/procedure/implant event; or a hospital transfer or hospital admission upon discharge from the facility. (G8907)  

## 2013-03-30 NOTE — Progress Notes (Signed)
1425 SOME COUGHING NOTED SUCTIONED

## 2013-03-30 NOTE — Op Note (Signed)
[facility] [address] [place], [address]   COLONOSCOPY PROCEDURE REPORT  PATIENT: Lance Martin, Lance Martin  MR#: [id number] BIRTHDATE: 05-Dec-1932 , 80  yrs. old GENDER: Male ENDOSCOPIST: Roxy Cedar, MD REFERRED [id number] Program Recall PROCEDURE DATE:  03/30/2013 PROCEDURE:   Colonoscopy with snare polypectomy x 1 First Screening Colonoscopy - Avg.  risk and is 50 yrs.  old or older - No.  Prior Negative Screening - Now for repeat screening. N/A  History of Adenoma - Now for follow-up colonoscopy & has been > or = to 3 yrs.  Yes hx of adenoma.  Has been 3 or more years since last colonoscopy.  Polyps Removed Today? Yes. ASA CLASS:   Class II INDICATIONS:Patient's personal history of adenomatous colon polyps. index exam 2003, followup 2009. Tubular adenomas MEDICATIONS: MAC sedation, administered by CRNA and propofol (Diprivan) 300mg  IV  DESCRIPTION OF PROCEDURE:   After the risks benefits and alternatives of the procedure were thoroughly explained, informed consent was obtained.  A digital rectal exam revealed no abnormalities of the rectum.   The LB [id number]  endoscope was introduced through the anus and advanced to the cecum, which was identified by both the appendix and ileocecal valve. No adverse events experienced.   The quality of the prep was excellent, using MoviPrep  The instrument was then slowly withdrawn as the colon was fully examined.  COLON FINDINGS: A diminutive polyp was found in the transverse colon.  A polypectomy was performed with a cold snare.  The resection was complete and the polyp tissue was completely retrieved.   The colon mucosa was otherwise normal.  Retroflexed views revealed internal hemorrhoids. The time to cecum=5 minutes 7 seconds.  Withdrawal time=7 minutes 0 seconds.  The scope was withdrawn and the procedure completed.  COMPLICATIONS: There were no complications.  ENDOSCOPIC IMPRESSION: 1.   Diminutive  polyp was found in the transverse colon; polypectomy was performed with a cold snare 2.   The colon mucosa was otherwise normal  RECOMMENDATIONS: 1. Return to the care of your primary provider.  GI follow up as needed   eSigned:  Roxy Cedar, MD 03/30/2013 2:49 PM   cc: Rodrigo Ran, MD and The Patient

## 2013-03-30 NOTE — Progress Notes (Signed)
Called to room to assist during endoscopic procedure.  Patient ID and intended procedure confirmed with present staff. Received instructions for my participation in the procedure from the performing physician.  

## 2013-03-31 ENCOUNTER — Telehealth: Payer: Self-pay | Admitting: *Deleted

## 2013-03-31 NOTE — Telephone Encounter (Signed)
  Follow up Call-  Call back number 03/30/2013  Post procedure Call Back phone  # [phone]  Permission to leave phone message Yes     Patient questions:  Do you have a fever, pain , or abdominal swelling? no Pain Score  0 *  Have you tolerated food without any problems? yes  Have you been able to return to your normal activities? yes  Do you have any questions about your discharge instructions: Diet   no Medications  no Follow up visit  no  Do you have questions or concerns about your Care? no  Actions: * If pain score is 4 or above: No action needed, pain <4.

## 2013-04-05 ENCOUNTER — Encounter: Payer: Self-pay | Admitting: Internal Medicine

## 2013-05-27 DIAGNOSIS — Z23 Encounter for immunization: Secondary | ICD-10-CM | POA: Diagnosis not present

## 2013-07-12 DIAGNOSIS — Z8546 Personal history of malignant neoplasm of prostate: Secondary | ICD-10-CM | POA: Diagnosis not present

## 2013-08-30 ENCOUNTER — Other Ambulatory Visit: Payer: Self-pay | Admitting: Dermatology

## 2013-08-30 DIAGNOSIS — C44319 Basal cell carcinoma of skin of other parts of face: Secondary | ICD-10-CM | POA: Diagnosis not present

## 2013-08-30 DIAGNOSIS — Z85828 Personal history of other malignant neoplasm of skin: Secondary | ICD-10-CM | POA: Diagnosis not present

## 2013-08-30 DIAGNOSIS — L821 Other seborrheic keratosis: Secondary | ICD-10-CM | POA: Diagnosis not present

## 2013-08-30 DIAGNOSIS — L570 Actinic keratosis: Secondary | ICD-10-CM | POA: Diagnosis not present

## 2013-09-16 DIAGNOSIS — C4491 Basal cell carcinoma of skin, unspecified: Secondary | ICD-10-CM | POA: Diagnosis not present

## 2013-09-16 DIAGNOSIS — C4441 Basal cell carcinoma of skin of scalp and neck: Secondary | ICD-10-CM | POA: Diagnosis not present

## 2013-09-16 DIAGNOSIS — D485 Neoplasm of uncertain behavior of skin: Secondary | ICD-10-CM | POA: Diagnosis not present

## 2013-10-11 DIAGNOSIS — L219 Seborrheic dermatitis, unspecified: Secondary | ICD-10-CM | POA: Diagnosis not present

## 2013-10-11 DIAGNOSIS — Z85828 Personal history of other malignant neoplasm of skin: Secondary | ICD-10-CM | POA: Diagnosis not present

## 2014-01-04 DIAGNOSIS — E785 Hyperlipidemia, unspecified: Secondary | ICD-10-CM | POA: Diagnosis not present

## 2014-01-04 DIAGNOSIS — Z136 Encounter for screening for cardiovascular disorders: Secondary | ICD-10-CM | POA: Diagnosis not present

## 2014-01-04 DIAGNOSIS — Z125 Encounter for screening for malignant neoplasm of prostate: Secondary | ICD-10-CM | POA: Diagnosis not present

## 2014-01-04 DIAGNOSIS — M109 Gout, unspecified: Secondary | ICD-10-CM | POA: Diagnosis not present

## 2014-01-04 DIAGNOSIS — I739 Peripheral vascular disease, unspecified: Secondary | ICD-10-CM | POA: Diagnosis not present

## 2014-01-04 DIAGNOSIS — R7301 Impaired fasting glucose: Secondary | ICD-10-CM | POA: Diagnosis not present

## 2014-01-04 DIAGNOSIS — I10 Essential (primary) hypertension: Secondary | ICD-10-CM | POA: Diagnosis not present

## 2014-01-10 ENCOUNTER — Other Ambulatory Visit: Payer: Self-pay | Admitting: Dermatology

## 2014-01-10 DIAGNOSIS — C44519 Basal cell carcinoma of skin of other part of trunk: Secondary | ICD-10-CM | POA: Diagnosis not present

## 2014-01-10 DIAGNOSIS — L570 Actinic keratosis: Secondary | ICD-10-CM | POA: Diagnosis not present

## 2014-01-10 DIAGNOSIS — D235 Other benign neoplasm of skin of trunk: Secondary | ICD-10-CM | POA: Diagnosis not present

## 2014-01-10 DIAGNOSIS — L821 Other seborrheic keratosis: Secondary | ICD-10-CM | POA: Diagnosis not present

## 2014-01-10 DIAGNOSIS — Z85828 Personal history of other malignant neoplasm of skin: Secondary | ICD-10-CM | POA: Diagnosis not present

## 2014-01-11 DIAGNOSIS — E785 Hyperlipidemia, unspecified: Secondary | ICD-10-CM | POA: Diagnosis not present

## 2014-01-11 DIAGNOSIS — I10 Essential (primary) hypertension: Secondary | ICD-10-CM | POA: Diagnosis not present

## 2014-01-11 DIAGNOSIS — N182 Chronic kidney disease, stage 2 (mild): Secondary | ICD-10-CM | POA: Diagnosis not present

## 2014-01-11 DIAGNOSIS — Z0000 Encounter for general adult medical examination without abnormal findings: Secondary | ICD-10-CM | POA: Diagnosis not present

## 2014-01-11 DIAGNOSIS — Z1331 Encounter for screening for depression: Secondary | ICD-10-CM | POA: Diagnosis not present

## 2014-01-11 DIAGNOSIS — R809 Proteinuria, unspecified: Secondary | ICD-10-CM | POA: Diagnosis not present

## 2014-01-11 DIAGNOSIS — M109 Gout, unspecified: Secondary | ICD-10-CM | POA: Diagnosis not present

## 2014-01-11 DIAGNOSIS — Z1212 Encounter for screening for malignant neoplasm of rectum: Secondary | ICD-10-CM | POA: Diagnosis not present

## 2014-01-11 DIAGNOSIS — Z87448 Personal history of other diseases of urinary system: Secondary | ICD-10-CM | POA: Diagnosis not present

## 2014-01-16 ENCOUNTER — Ambulatory Visit: Payer: Medicare Other

## 2014-01-16 ENCOUNTER — Ambulatory Visit (INDEPENDENT_AMBULATORY_CARE_PROVIDER_SITE_OTHER): Payer: Medicare Other | Admitting: Family Medicine

## 2014-01-16 VITALS — BP 140/80 | HR 76 | Temp 98.0°F | Resp 16 | Ht 70.0 in | Wt 176.0 lb

## 2014-01-16 DIAGNOSIS — R05 Cough: Secondary | ICD-10-CM | POA: Diagnosis not present

## 2014-01-16 DIAGNOSIS — J189 Pneumonia, unspecified organism: Secondary | ICD-10-CM | POA: Diagnosis not present

## 2014-01-16 DIAGNOSIS — R059 Cough, unspecified: Secondary | ICD-10-CM

## 2014-01-16 DIAGNOSIS — J029 Acute pharyngitis, unspecified: Secondary | ICD-10-CM | POA: Diagnosis not present

## 2014-01-16 LAB — POCT RAPID STREP A (OFFICE): Rapid Strep A Screen: NEGATIVE

## 2014-01-16 MED ORDER — HYDROCODONE-HOMATROPINE 5-1.5 MG/5ML PO SYRP: 5.0000 mL | ORAL_SOLUTION | Freq: Three times a day (TID) | ORAL | Status: DC | PRN

## 2014-01-16 MED ORDER — LEVOFLOXACIN 500 MG PO TABS: 500.0000 mg | ORAL_TABLET | Freq: Every day | ORAL | Status: DC

## 2014-01-16 NOTE — Patient Instructions (Signed)

## 2014-01-16 NOTE — Progress Notes (Signed)
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 He's a 20 by his wife. He is uncertain whether his had a temperature.  Patient has no chest pain.  Patient complains of intensifying sore throat with difficulty swallowing and he's cut back on how much she is eating and drinking because of it.  He had a physical last week with Dr. Joylene Draft.  Objective: Patient is mildly overweight accompanied by his wife. He has some erythema along the old incision on his left frontal parietal scalp. He also has some mild erythema in the right nasolabial fold.  HEENT: Significant only for significant erythema in the posterior pharynx. Neck: Supple with no adenopathy, good range of motion Chest: Bilateral basilar rhonchi and rales, no tachypnea Heart: Regular, no obvious murmur Extremities: Trace edema  [facility] reading (PRIMARY) by  Dr. Joseph Art.CXR:  Hazy left lower lobe c/w early infiltrate  Results for orders placed in visit on 01/16/14  POCT RAPID STREP A (OFFICE)      Result Value Ref Range   Rapid Strep A Screen Negative  Negative   Cough - Plan: DG Chest 2 View, HYDROcodone-homatropine (HYCODAN) 5-1.5 MG/5ML syrup  Sore throat - Plan: POCT rapid strep A, HYDROcodone-homatropine (HYCODAN) 5-1.5 MG/5ML syrup  Pneumonia, organism unspecified - Plan: levofloxacin (LEVAQUIN) 500 MG tablet  Signed, Robyn Haber, MD

## 2014-02-22 DIAGNOSIS — Z961 Presence of intraocular lens: Secondary | ICD-10-CM | POA: Diagnosis not present

## 2014-02-22 DIAGNOSIS — D3130 Benign neoplasm of unspecified choroid: Secondary | ICD-10-CM | POA: Diagnosis not present

## 2014-02-26 ENCOUNTER — Encounter (HOSPITAL_COMMUNITY): Payer: Self-pay | Admitting: Emergency Medicine

## 2014-02-26 ENCOUNTER — Emergency Department (HOSPITAL_COMMUNITY): Payer: Medicare Other

## 2014-02-26 ENCOUNTER — Emergency Department (HOSPITAL_COMMUNITY)
Admission: EM | Admit: 2014-02-26 | Discharge: 2014-02-26 | Disposition: A | Discharge status: Home / Self Care | Payer: Medicare Other | Attending: Emergency Medicine | Admitting: Emergency Medicine

## 2014-02-26 DIAGNOSIS — Z85828 Personal history of other malignant neoplasm of skin: Secondary | ICD-10-CM | POA: Diagnosis not present

## 2014-02-26 DIAGNOSIS — E785 Hyperlipidemia, unspecified: Secondary | ICD-10-CM | POA: Diagnosis not present

## 2014-02-26 DIAGNOSIS — Z792 Long term (current) use of antibiotics: Secondary | ICD-10-CM | POA: Diagnosis not present

## 2014-02-26 DIAGNOSIS — M169 Osteoarthritis of hip, unspecified: Secondary | ICD-10-CM | POA: Insufficient documentation

## 2014-02-26 DIAGNOSIS — S60219A Contusion of unspecified wrist, initial encounter: Secondary | ICD-10-CM | POA: Diagnosis not present

## 2014-02-26 DIAGNOSIS — Z79899 Other long term (current) drug therapy: Secondary | ICD-10-CM | POA: Diagnosis not present

## 2014-02-26 DIAGNOSIS — S46909A Unspecified injury of unspecified muscle, fascia and tendon at shoulder and upper arm level, unspecified arm, initial encounter: Secondary | ICD-10-CM | POA: Diagnosis not present

## 2014-02-26 DIAGNOSIS — S4991XA Unspecified injury of right shoulder and upper arm, initial encounter: Secondary | ICD-10-CM

## 2014-02-26 DIAGNOSIS — Z8546 Personal history of malignant neoplasm of prostate: Secondary | ICD-10-CM | POA: Insufficient documentation

## 2014-02-26 DIAGNOSIS — Y9289 Other specified places as the place of occurrence of the external cause: Secondary | ICD-10-CM | POA: Insufficient documentation

## 2014-02-26 DIAGNOSIS — Z87448 Personal history of other diseases of urinary system: Secondary | ICD-10-CM | POA: Diagnosis not present

## 2014-02-26 DIAGNOSIS — Y93E5 Activity, floor mopping and cleaning: Secondary | ICD-10-CM | POA: Diagnosis not present

## 2014-02-26 DIAGNOSIS — M25539 Pain in unspecified wrist: Secondary | ICD-10-CM | POA: Diagnosis not present

## 2014-02-26 DIAGNOSIS — W19XXXA Unspecified fall, initial encounter: Secondary | ICD-10-CM

## 2014-02-26 DIAGNOSIS — S4980XA Other specified injuries of shoulder and upper arm, unspecified arm, initial encounter: Secondary | ICD-10-CM | POA: Diagnosis not present

## 2014-02-26 DIAGNOSIS — M79609 Pain in unspecified limb: Secondary | ICD-10-CM | POA: Diagnosis not present

## 2014-02-26 DIAGNOSIS — IMO0002 Reserved for concepts with insufficient information to code with codable children: Secondary | ICD-10-CM | POA: Diagnosis not present

## 2014-02-26 DIAGNOSIS — Z9889 Other specified postprocedural states: Secondary | ICD-10-CM | POA: Insufficient documentation

## 2014-02-26 DIAGNOSIS — I10 Essential (primary) hypertension: Secondary | ICD-10-CM | POA: Diagnosis not present

## 2014-02-26 DIAGNOSIS — M1610 Unilateral primary osteoarthritis, unspecified hip: Secondary | ICD-10-CM | POA: Insufficient documentation

## 2014-02-26 DIAGNOSIS — W010XXA Fall on same level from slipping, tripping and stumbling without subsequent striking against object, initial encounter: Secondary | ICD-10-CM | POA: Diagnosis not present

## 2014-02-26 DIAGNOSIS — S6990XA Unspecified injury of unspecified wrist, hand and finger(s), initial encounter: Secondary | ICD-10-CM | POA: Diagnosis not present

## 2014-02-26 DIAGNOSIS — Z9981 Dependence on supplemental oxygen: Secondary | ICD-10-CM | POA: Diagnosis not present

## 2014-02-26 DIAGNOSIS — M25519 Pain in unspecified shoulder: Secondary | ICD-10-CM | POA: Diagnosis not present

## 2014-02-26 DIAGNOSIS — G4733 Obstructive sleep apnea (adult) (pediatric): Secondary | ICD-10-CM | POA: Insufficient documentation

## 2014-02-26 DIAGNOSIS — S59909A Unspecified injury of unspecified elbow, initial encounter: Secondary | ICD-10-CM | POA: Diagnosis present

## 2014-02-26 DIAGNOSIS — M109 Gout, unspecified: Secondary | ICD-10-CM | POA: Diagnosis not present

## 2014-02-26 MED ORDER — TRAMADOL HCL 50 MG PO TABS: 50.0000 mg | ORAL_TABLET | Freq: Four times a day (QID) | ORAL | Status: DC | PRN

## 2014-02-26 NOTE — ED Provider Notes (Signed)
CSN: [id number]     Arrival date & time 02/26/14  1328 History  This chart was scribed for non-physician practitioner, Alvina Chou, PA-C, working with Mariea Clonts, MD, by Delphia Grates, ED Scribe. This patient was seen in room WTR6/WTR6 and the patient's care was started at 1:51 PM.    Chief Complaint  Patient presents with  . Fall     Patient is a 80 y.o. male presenting with fall. The history is provided by the patient. No language interpreter was used.  Fall This is a new problem. The current episode started yesterday. The problem has not changed since onset.   HPI Comments: Lance Martin is a 80 y.o. male who presents to the Emergency Department complaining of fall that occurred last night. Patient states he was on a cleaning a deck and became tangled in a water hose which caused him to fall.  He states he attempted to brace himself with his right arm and landed on his right shoulder. Patient did not hit his head and denies LOC. There is associated pain with decreased ROM to the right shoulder, superficial abrasions to the right wrist and right leg, and swelling to the right wrist and hand. He states the pain is worsened with movement of his right arm. He denies numbness, weakness, or paresthesia. Patient has history of HTN, gout, arthritis, hyperlipidemia, and prostate cancer.  Past Medical History  Diagnosis Date  . Hypertension   . Hematuria   . Hx of subdural hematoma 08-14-2011    s/p total left hip  12-92012---  resolved w/ no residual  . History of basal cell carcinoma excision 08-29-2011  left frontal scalp  . History of prostate cancer 2004   -  RX EXTERNAL RADIATION  . History of gout PER PT -  STABLE  . Arthritis   . Hyperlipidemia   . Cancer     prostate  . Gout   . IFG (impaired fasting glucose)   . Hemorrhoid   . OA (osteoarthritis) of hip   . Shoulder impingement syndrome     right  . Microalbuminuria 2011  . AV block 05/13 ecg    1st degree  .  Bleeding     bladder,radiation cystitis-s/p cystoscopy, clot evacuation, fulgeration of bleeders  . Lab findings of hypoxemia     abnormal ONO , dr perini. 05-26-2012  . Obstructive apnea     AHI of 35 on 01-14-13 , titrated to 6 cm H20  . Sleep apnea with use of continuous positive airway pressure (CPAP) 03/19/2013   Past Surgical History  Procedure Laterality Date  . Left elbow surgery  2009    ELBOW AND WRIST SURGERY  . Eye surgery  2009    RIGHT CATARACT EXTRACTION  . Tonsillectomy      AGE 13  . Total hip arthroplasty  07/31/2011    Procedure: TOTAL HIP ARTHROPLASTY;  Surgeon: Dione Plover Aluisio;  Location: [facility];  Service: Orthopedics;  Laterality: Left;  . Total hip arthroplasty    . Right elbow surgery  MAR 2013  . Cardiac catheterization    . Cystoscopy with biopsy  07/03/2012    Procedure: CYSTOSCOPY WITH BIOPSY;  Surgeon: Franchot Gallo, MD;  Location: [facility];  Service: Urology;  Laterality: N/A;      . Transurethral resection of prostate  07/25/2012    Procedure: TRANSURETHRAL RESECTION OF THE PROSTATE WITH GYRUS INSTRUMENTS;  Surgeon: Alexis Frock, MD;  Location: [facility];  Service: Urology;  Laterality: N/A;  CYSTOSCOPY;CLOT EVACUATION WITH FULGERATION  . Ulnar nerve repair  04/13   Family History  Problem Relation Age of Onset  . Colon cancer Neg Hx    History  Substance Use Topics  . Smoking status: Never Smoker   . Smokeless tobacco: Never Used  . Alcohol Use: 8.4 oz/week    7 Glasses of wine, 7 Shots of liquor per week    Review of Systems  Musculoskeletal: Positive for arthralgias (right shoulder) and joint swelling (right wrist).  Neurological: Negative for syncope, weakness and numbness.  All other systems reviewed and are negative.     Allergies  Allopurinol and Ampicillin  Home Medications   Prior to Admission medications   Medication Sig Start Date End Date Taking? Authorizing Provider  amLODipine (NORVASC) 5 MG tablet  Take 2.5 mg by mouth every evening.     Historical Provider, MD  aspirin 81 MG tablet Take 81 mg by mouth once a week.     Historical Provider, MD  atorvastatin (LIPITOR) 20 MG tablet Take 20 mg by mouth at bedtime.     Historical Provider, MD  benazepril (LOTENSIN) 40 MG tablet Take 40 mg by mouth at bedtime.     Historical Provider, MD  Calcium Citrate-Vitamin D (CALCIUM CITRATE + D PO) Take 1 tablet by mouth daily.     Historical Provider, MD  cetirizine (ZYRTEC) 10 MG chewable tablet Chew 10 mg by mouth daily.    Historical Provider, MD  Cholecalciferol (VITAMIN D-3 PO) Take 1 capsule by mouth daily.    Historical Provider, MD  clindamycin (CLEOCIN) 300 MG capsule Before dental appt. 11/05/12   Historical Provider, MD  Coenzyme Q10 (CO Q 10 PO) Take 1 tablet by mouth daily.     Historical Provider, MD  Cyanocobalamin (B-12 PO) Take 1 tablet by mouth daily.    Historical Provider, MD  DUREZOL 0.05 % EMUL  01/18/13   Historical Provider, MD  ezetimibe (ZETIA) 10 MG tablet Take 5 mg by mouth daily.    Historical Provider, MD  fenofibrate (TRICOR) 48 MG tablet Take 48 mg by mouth every evening.     Historical Provider, MD  fish oil-omega-3 fatty acids 1000 MG capsule Take 1 g by mouth 2 (two) times daily.     Historical Provider, MD  fluticasone (FLONASE) 50 MCG/ACT nasal spray Place 2 sprays into the nose daily as needed. ALLERGIES    Historical Provider, MD  furosemide (LASIX) 20 MG tablet Take 20 mg by mouth every other day. IN THE AM    Historical Provider, MD  HYDROcodone-acetaminophen (NORCO/VICODIN) 5-325 MG per tablet Take 1 tablet by mouth every 6 (six) hours as needed for moderate pain.    Historical Provider, MD  HYDROcodone-homatropine (HYCODAN) 5-1.5 MG/5ML syrup Take 5 mLs by mouth every 8 (eight) hours as needed for cough. 01/16/14   Robyn Haber, MD  levofloxacin (LEVAQUIN) 500 MG tablet Take 1 tablet (500 mg total) by mouth daily. 01/16/14   Robyn Haber, MD  Multiple  Vitamins-Minerals (MULTIVITAMINS THER. W/MINERALS) TABS Take 1 tablet by mouth daily.     Historical Provider, MD  Nebivolol HCl (BYSTOLIC) 20 MG TABS Take 1 tablet by mouth at bedtime.     Historical Provider, MD  potassium chloride (KLOR-CON) 10 MEQ CR tablet Take 10 mEq by mouth 3 (three) times a week.     Historical Provider, MD  pseudoephedrine-guaifenesin (MUCINEX D) 60-600 MG per tablet Take 1 tablet by mouth every 12 (twelve) hours.  Historical Provider, MD  SULFINPYRAZONE PO Take 200 mg by mouth every morning.     Historical Provider, MD  zolpidem (AMBIEN) 10 MG tablet Take 10 mg by mouth at bedtime as needed for sleep.    Historical Provider, MD   Triage Vitals: BP 127/54  Pulse 80  Temp(Src) 98.7 F (37.1 C) (Oral)  Resp 20  SpO2 96%  Physical Exam  Nursing note and vitals reviewed. Constitutional: He is oriented to person, place, and time. He appears well-developed and well-nourished. No distress.  HENT:  Head: Normocephalic and atraumatic.  Eyes: Conjunctivae and EOM are normal.  Neck: Neck supple. No tracheal deviation present.  Cardiovascular: Normal rate.   Pulmonary/Chest: Effort normal. No respiratory distress.  Musculoskeletal: He exhibits edema and tenderness.  Anterior right shoulder TTP. No obvious deformity. severely limited ROM of right shoulder due to pain. Generalized edema and mild bruising of right wrist. No obvious deformity. FROM of fingers of right hand and right wrist. No right snuffbox TTP.  Neurological: He is alert and oriented to person, place, and time.  Bilateral grip strength and upper extremity sensation equal and intact.  Skin: Skin is warm and dry.  Psychiatric: He has a normal mood and affect. His behavior is normal.    ED Course  Procedures (including critical care time)  DIAGNOSTIC STUDIES: Oxygen Saturation is 96% on room air, normal by my interpretation.    COORDINATION OF CARE: At 7989 Discussed treatment plan with patient which  includes imaging. Patient agrees.   SPLINT APPLICATION Date/Time: 2:11 PM Authorized by: Alvina Chou Consent: Verbal consent obtained. Risks and benefits: risks, benefits and alternatives were discussed Consent given by: patient Splint applied by: nurse Location details: right arm Splint type: sling Supplies used: sling Post-procedure: The splinted body part was neurovascularly unchanged following the procedure. Patient tolerance: Patient tolerated the procedure well with no immediate complications.    Labs Review Labs Reviewed - No data to display  Imaging Review Dg Shoulder Right  02/26/2014   CLINICAL DATA:  Right shoulder pain after fall.  EXAM: RIGHT SHOULDER - 2+ VIEW  COMPARISON:  None.  FINDINGS: There is no evidence of fracture or dislocation. Visualized ribs appear normal. There is no evidence of arthropathy or other focal bone abnormality. Soft tissues are unremarkable.  IMPRESSION: Normal right shoulder.   Electronically Signed   By: Sabino Dick M.D.   On: 02/26/2014 14:19   Dg Wrist Complete Right  02/26/2014   CLINICAL DATA:  [place] last night.  Pain.  EXAM: RIGHT WRIST - COMPLETE 3+ VIEW  COMPARISON:  None.  FINDINGS: No fracture. No dislocation. There is a cyst in the central scaphoid. Soft tissues are unremarkable.  IMPRESSION: No fracture or acute finding.   Electronically Signed   By: Lajean Manes M.D.   On: 02/26/2014 14:21   Dg Hand Complete Right  02/26/2014   CLINICAL DATA:  Right hand injury after fall.  EXAM: RIGHT HAND - COMPLETE 3+ VIEW  COMPARISON:  None.  FINDINGS: There is no evidence of fracture or dislocation. There is no evidence of arthropathy or other focal bone abnormality. Soft tissues are unremarkable.  IMPRESSION: Normal right hand.   Electronically Signed   By: Sabino Dick M.D.   On: 02/26/2014 14:24     EKG Interpretation None      MDM   Final diagnoses:  Fall, initial encounter  Right shoulder injury, initial encounter     2:51 PM Patient's xrays unremarkable for acute changes.  No neurovascular compromise. Patient will have an arm sling and recommended follow up with his orthopedic doctor. Patient will have Tramadol for pain.   I personally performed the services described in this documentation, which was scribed in my presence. The recorded information has been reviewed and is accurate.    Alvina Chou, PA-C 02/26/14 1453

## 2014-02-26 NOTE — Discharge Instructions (Signed)
Take Tramadol as needed for pain. Wear the shoulder sling as needed for comfort. Apply ice to your shoulder as needed for pain. Refer to attached documents for more information. Follow up with your orthopedist for further evaluation.

## 2014-02-26 NOTE — ED Provider Notes (Signed)
Medical screening examination/treatment/procedure(s) were conducted as a shared visit with non-physician practitioner(s) or resident  and myself.  I personally evaluated the patient during the encounter and agree with the findings.   I have personally reviewed any xrays and/ or EKG's with the provider and I agree with interpretation.   Patient presented with mechanical fall right shoulder and wrist tenderness. On exam patient has anterior right shoulder tenderness with decreased range of motion due to pain. No significant swelling and x-rays no acute fracture. Concern for ligamentous/rotator injury. Patient has orbital followup. Sling placed in ER for comfort  Fall, right shoulder injury  Mariea Clonts, MD 02/26/14 1540

## 2014-02-26 NOTE — ED Notes (Signed)
Patient slipped and fell last evening landing on right shoulder.  Patient only has pain in that area with movement of his arm.  Superficial abrasions to right wrist and right leg.

## 2014-02-26 NOTE — ED Notes (Signed)
Patient transported to X-ray 

## 2014-02-28 DIAGNOSIS — M25519 Pain in unspecified shoulder: Secondary | ICD-10-CM | POA: Diagnosis not present

## 2014-02-28 DIAGNOSIS — M25529 Pain in unspecified elbow: Secondary | ICD-10-CM | POA: Diagnosis not present

## 2014-03-03 DIAGNOSIS — M25519 Pain in unspecified shoulder: Secondary | ICD-10-CM | POA: Diagnosis not present

## 2014-03-24 DIAGNOSIS — M25519 Pain in unspecified shoulder: Secondary | ICD-10-CM | POA: Diagnosis not present

## 2014-04-05 DIAGNOSIS — M25519 Pain in unspecified shoulder: Secondary | ICD-10-CM | POA: Diagnosis not present

## 2014-04-08 DIAGNOSIS — M25519 Pain in unspecified shoulder: Secondary | ICD-10-CM | POA: Diagnosis not present

## 2014-04-12 DIAGNOSIS — M25519 Pain in unspecified shoulder: Secondary | ICD-10-CM | POA: Diagnosis not present

## 2014-04-15 DIAGNOSIS — M25519 Pain in unspecified shoulder: Secondary | ICD-10-CM | POA: Diagnosis not present

## 2014-04-20 DIAGNOSIS — M25519 Pain in unspecified shoulder: Secondary | ICD-10-CM | POA: Diagnosis not present

## 2014-04-22 DIAGNOSIS — M25519 Pain in unspecified shoulder: Secondary | ICD-10-CM | POA: Diagnosis not present

## 2014-04-26 DIAGNOSIS — M25519 Pain in unspecified shoulder: Secondary | ICD-10-CM | POA: Diagnosis not present

## 2014-04-27 DIAGNOSIS — Z23 Encounter for immunization: Secondary | ICD-10-CM | POA: Diagnosis not present

## 2014-04-28 DIAGNOSIS — M25519 Pain in unspecified shoulder: Secondary | ICD-10-CM | POA: Diagnosis not present

## 2014-05-05 DIAGNOSIS — M25519 Pain in unspecified shoulder: Secondary | ICD-10-CM | POA: Diagnosis not present

## 2014-07-18 DIAGNOSIS — Z85828 Personal history of other malignant neoplasm of skin: Secondary | ICD-10-CM | POA: Diagnosis not present

## 2014-07-18 DIAGNOSIS — L570 Actinic keratosis: Secondary | ICD-10-CM | POA: Diagnosis not present

## 2014-07-18 DIAGNOSIS — Z08 Encounter for follow-up examination after completed treatment for malignant neoplasm: Secondary | ICD-10-CM | POA: Diagnosis not present

## 2014-12-23 ENCOUNTER — Encounter: Payer: Self-pay | Admitting: Internal Medicine

## 2015-01-25 DIAGNOSIS — R7301 Impaired fasting glucose: Secondary | ICD-10-CM | POA: Diagnosis not present

## 2015-01-25 DIAGNOSIS — I739 Peripheral vascular disease, unspecified: Secondary | ICD-10-CM | POA: Diagnosis not present

## 2015-01-25 DIAGNOSIS — E785 Hyperlipidemia, unspecified: Secondary | ICD-10-CM | POA: Diagnosis not present

## 2015-01-25 DIAGNOSIS — M109 Gout, unspecified: Secondary | ICD-10-CM | POA: Diagnosis not present

## 2015-01-25 DIAGNOSIS — Z125 Encounter for screening for malignant neoplasm of prostate: Secondary | ICD-10-CM | POA: Diagnosis not present

## 2015-01-30 DIAGNOSIS — Z1212 Encounter for screening for malignant neoplasm of rectum: Secondary | ICD-10-CM | POA: Diagnosis not present

## 2015-02-01 DIAGNOSIS — L219 Seborrheic dermatitis, unspecified: Secondary | ICD-10-CM | POA: Diagnosis not present

## 2015-02-01 DIAGNOSIS — I6200 Nontraumatic subdural hemorrhage, unspecified: Secondary | ICD-10-CM | POA: Diagnosis not present

## 2015-02-01 DIAGNOSIS — R809 Proteinuria, unspecified: Secondary | ICD-10-CM | POA: Diagnosis not present

## 2015-02-01 DIAGNOSIS — D7589 Other specified diseases of blood and blood-forming organs: Secondary | ICD-10-CM | POA: Diagnosis not present

## 2015-02-01 DIAGNOSIS — G4733 Obstructive sleep apnea (adult) (pediatric): Secondary | ICD-10-CM | POA: Diagnosis not present

## 2015-02-01 DIAGNOSIS — H9190 Unspecified hearing loss, unspecified ear: Secondary | ICD-10-CM | POA: Diagnosis not present

## 2015-02-01 DIAGNOSIS — Z1389 Encounter for screening for other disorder: Secondary | ICD-10-CM | POA: Diagnosis not present

## 2015-02-01 DIAGNOSIS — Z0000 Encounter for general adult medical examination without abnormal findings: Secondary | ICD-10-CM | POA: Diagnosis not present

## 2015-02-01 DIAGNOSIS — Z6826 Body mass index (BMI) 26.0-26.9, adult: Secondary | ICD-10-CM | POA: Diagnosis not present

## 2015-02-01 DIAGNOSIS — N182 Chronic kidney disease, stage 2 (mild): Secondary | ICD-10-CM | POA: Diagnosis not present

## 2015-02-01 DIAGNOSIS — M1990 Unspecified osteoarthritis, unspecified site: Secondary | ICD-10-CM | POA: Diagnosis not present

## 2015-02-01 DIAGNOSIS — I440 Atrioventricular block, first degree: Secondary | ICD-10-CM | POA: Diagnosis not present

## 2015-02-27 DIAGNOSIS — Z961 Presence of intraocular lens: Secondary | ICD-10-CM | POA: Diagnosis not present

## 2015-02-27 DIAGNOSIS — H52203 Unspecified astigmatism, bilateral: Secondary | ICD-10-CM | POA: Diagnosis not present

## 2015-02-27 DIAGNOSIS — D3131 Benign neoplasm of right choroid: Secondary | ICD-10-CM | POA: Diagnosis not present

## 2015-04-12 ENCOUNTER — Other Ambulatory Visit: Payer: Self-pay | Admitting: Dermatology

## 2015-04-12 DIAGNOSIS — C44319 Basal cell carcinoma of skin of other parts of face: Secondary | ICD-10-CM | POA: Diagnosis not present

## 2015-04-28 DIAGNOSIS — Z23 Encounter for immunization: Secondary | ICD-10-CM | POA: Diagnosis not present

## 2015-05-24 DIAGNOSIS — Z85828 Personal history of other malignant neoplasm of skin: Secondary | ICD-10-CM | POA: Diagnosis not present

## 2015-05-24 DIAGNOSIS — L570 Actinic keratosis: Secondary | ICD-10-CM | POA: Diagnosis not present

## 2015-05-24 DIAGNOSIS — Z08 Encounter for follow-up examination after completed treatment for malignant neoplasm: Secondary | ICD-10-CM | POA: Diagnosis not present

## 2015-10-06 DIAGNOSIS — C61 Malignant neoplasm of prostate: Secondary | ICD-10-CM | POA: Diagnosis not present

## 2015-10-06 DIAGNOSIS — Z0000 Encounter for general adult medical examination without abnormal findings: Secondary | ICD-10-CM | POA: Diagnosis not present

## 2015-10-06 DIAGNOSIS — N35011 Post-traumatic bulbous urethral stricture: Secondary | ICD-10-CM | POA: Diagnosis not present

## 2015-10-06 DIAGNOSIS — R300 Dysuria: Secondary | ICD-10-CM | POA: Diagnosis not present

## 2015-10-06 DIAGNOSIS — R310 Gross hematuria: Secondary | ICD-10-CM | POA: Diagnosis not present

## 2015-11-22 DIAGNOSIS — L821 Other seborrheic keratosis: Secondary | ICD-10-CM | POA: Diagnosis not present

## 2015-11-22 DIAGNOSIS — L812 Freckles: Secondary | ICD-10-CM | POA: Diagnosis not present

## 2015-11-22 DIAGNOSIS — D1801 Hemangioma of skin and subcutaneous tissue: Secondary | ICD-10-CM | POA: Diagnosis not present

## 2015-11-22 DIAGNOSIS — L570 Actinic keratosis: Secondary | ICD-10-CM | POA: Diagnosis not present

## 2016-01-03 DIAGNOSIS — Z8546 Personal history of malignant neoplasm of prostate: Secondary | ICD-10-CM | POA: Diagnosis not present

## 2016-01-03 DIAGNOSIS — N35011 Post-traumatic bulbous urethral stricture: Secondary | ICD-10-CM | POA: Diagnosis not present

## 2016-01-03 DIAGNOSIS — Z0000 Encounter for general adult medical examination without abnormal findings: Secondary | ICD-10-CM | POA: Diagnosis not present

## 2016-01-03 DIAGNOSIS — R310 Gross hematuria: Secondary | ICD-10-CM | POA: Diagnosis not present

## 2016-02-29 DIAGNOSIS — Z125 Encounter for screening for malignant neoplasm of prostate: Secondary | ICD-10-CM | POA: Diagnosis not present

## 2016-02-29 DIAGNOSIS — R7301 Impaired fasting glucose: Secondary | ICD-10-CM | POA: Diagnosis not present

## 2016-02-29 DIAGNOSIS — E784 Other hyperlipidemia: Secondary | ICD-10-CM | POA: Diagnosis not present

## 2016-02-29 DIAGNOSIS — M109 Gout, unspecified: Secondary | ICD-10-CM | POA: Diagnosis not present

## 2016-02-29 DIAGNOSIS — I10 Essential (primary) hypertension: Secondary | ICD-10-CM | POA: Diagnosis not present

## 2016-03-04 DIAGNOSIS — H353111 Nonexudative age-related macular degeneration, right eye, early dry stage: Secondary | ICD-10-CM | POA: Diagnosis not present

## 2016-03-04 DIAGNOSIS — H52203 Unspecified astigmatism, bilateral: Secondary | ICD-10-CM | POA: Diagnosis not present

## 2016-03-04 DIAGNOSIS — D3131 Benign neoplasm of right choroid: Secondary | ICD-10-CM | POA: Diagnosis not present

## 2016-03-04 DIAGNOSIS — H353121 Nonexudative age-related macular degeneration, left eye, early dry stage: Secondary | ICD-10-CM | POA: Diagnosis not present

## 2016-03-07 DIAGNOSIS — C61 Malignant neoplasm of prostate: Secondary | ICD-10-CM | POA: Diagnosis not present

## 2016-03-07 DIAGNOSIS — I6200 Nontraumatic subdural hemorrhage, unspecified: Secondary | ICD-10-CM | POA: Diagnosis not present

## 2016-03-07 DIAGNOSIS — D7589 Other specified diseases of blood and blood-forming organs: Secondary | ICD-10-CM | POA: Diagnosis not present

## 2016-03-07 DIAGNOSIS — I7389 Other specified peripheral vascular diseases: Secondary | ICD-10-CM | POA: Diagnosis not present

## 2016-03-07 DIAGNOSIS — Z6825 Body mass index (BMI) 25.0-25.9, adult: Secondary | ICD-10-CM | POA: Diagnosis not present

## 2016-03-07 DIAGNOSIS — G4733 Obstructive sleep apnea (adult) (pediatric): Secondary | ICD-10-CM | POA: Diagnosis not present

## 2016-03-07 DIAGNOSIS — Z1389 Encounter for screening for other disorder: Secondary | ICD-10-CM | POA: Diagnosis not present

## 2016-03-07 DIAGNOSIS — N182 Chronic kidney disease, stage 2 (mild): Secondary | ICD-10-CM | POA: Diagnosis not present

## 2016-03-07 DIAGNOSIS — I440 Atrioventricular block, first degree: Secondary | ICD-10-CM | POA: Diagnosis not present

## 2016-03-07 DIAGNOSIS — H9193 Unspecified hearing loss, bilateral: Secondary | ICD-10-CM | POA: Diagnosis not present

## 2016-03-07 DIAGNOSIS — Z0000 Encounter for general adult medical examination without abnormal findings: Secondary | ICD-10-CM | POA: Diagnosis not present

## 2016-03-07 DIAGNOSIS — R634 Abnormal weight loss: Secondary | ICD-10-CM | POA: Diagnosis not present

## 2016-03-08 ENCOUNTER — Telehealth: Payer: Self-pay | Admitting: Internal Medicine

## 2016-03-11 DIAGNOSIS — K760 Fatty (change of) liver, not elsewhere classified: Secondary | ICD-10-CM | POA: Diagnosis not present

## 2016-03-11 DIAGNOSIS — I708 Atherosclerosis of other arteries: Secondary | ICD-10-CM | POA: Diagnosis not present

## 2016-03-11 DIAGNOSIS — I700 Atherosclerosis of aorta: Secondary | ICD-10-CM | POA: Diagnosis not present

## 2016-03-11 NOTE — Telephone Encounter (Signed)
Appointment has been scheduled for 03/19/16 @ 10:00am with Amy Troutdale. Left message for patient to call and confirm this appointment.

## 2016-03-19 ENCOUNTER — Ambulatory Visit (INDEPENDENT_AMBULATORY_CARE_PROVIDER_SITE_OTHER): Payer: Medicare Other | Admitting: Physician Assistant

## 2016-03-19 ENCOUNTER — Encounter: Payer: Self-pay | Admitting: Physician Assistant

## 2016-03-19 ENCOUNTER — Encounter (INDEPENDENT_AMBULATORY_CARE_PROVIDER_SITE_OTHER): Payer: Self-pay

## 2016-03-19 VITALS — BP 134/50 | HR 88 | Ht 69.0 in | Wt 172.5 lb

## 2016-03-19 DIAGNOSIS — R634 Abnormal weight loss: Secondary | ICD-10-CM | POA: Diagnosis not present

## 2016-03-19 DIAGNOSIS — R110 Nausea: Secondary | ICD-10-CM

## 2016-03-19 DIAGNOSIS — R1310 Dysphagia, unspecified: Secondary | ICD-10-CM | POA: Diagnosis not present

## 2016-03-19 NOTE — Patient Instructions (Signed)

## 2016-03-19 NOTE — Progress Notes (Signed)
Subjective:    Patient ID: Lance Martin, male    DOB: 10-Apr-1933, 82 y.o.   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 Henrene Pastor who is referred today by Dr. Joylene Draft for evaluation of weight loss and nausea as well as intermittent dysphagia. Patient was last seen here in 2014 when he underwent colonoscopy for follow-up of adenomatous colon polyps. He had a diminutive polyp in the transverse colon removed which was a tubular adenoma. Other medical issues include sleep apnea for which she uses CPAP, hypertension history of subdural hematoma in 2013 he also had a left total hip replacement 2013 has chronic kidney disease stage III and history of prostate cancer 2004 for which she underwent radiation and has complication of radiation cystitis. Patient says that she started having some nausea about 3 or 4 weeks ago which may have been related to starting use of nicotine for his skin. He said he had red where this was helpful in preventing melanomas and had given himself a trial. He has since stopped the nicotine and says that the nausea does seem to be better. Also in the background of this he has had several month history of intermittent dysphasia. He says he would have episodes where he would be eating even with soft foods and feel as if he couldn't swallow even one more bite or he would vomit. This would bring on belching and then episodes of regurgitation. This does not happen on a daily basis but seem to be happening more frequently with eating on let's etc. Says after he regurgitates he's able to go back to eating. He has had a weight loss totaling 12 pounds over the past year but he says 8 of those were intentional in the last 4 has been over the past couple of months with the nausea and intermittent dysphasia. He denies any heartburn or indigestion. This is definitely cutting his food up smaller in eating much slower seems to help. He has no  complaints of abdominal pain no changes in bowel habits and no melena or hematochezia. Ultrasound done July 31 shows a fatty liver atherosclerosis and otherwise negative labs done 03/08/2016 hemoglobin 12.4 hematocrit of 37 and MCV of 103 platelets 107 LFT showed total bilirubin of 1.0 alkaline phosphatase 90 AST of 80 and ALT of 38. Patient does drink alcohol on a daily basis says he usually has 2 cocktails with dinner and sometimes 2 or 3 Shearon Stalls later in the evening. He knows this is too much is trying to cut back.  Review of Systems Pertinent positive and negative review of systems were noted in the above HPI section.  All other review of systems was otherwise negative.  Outpatient Encounter Prescriptions as of 03/19/2016  Medication Sig  . amLODipine (NORVASC) 5 MG tablet Take 2.5 mg by mouth every evening.   Marland Kitchen aspirin 81 MG tablet Take 81 mg by mouth once a week.   Marland Kitchen atorvastatin (LIPITOR) 20 MG tablet Take 20 mg by mouth at bedtime.   . benazepril (LOTENSIN) 40 MG tablet Take 40 mg by mouth at bedtime.   . Calcium Citrate-Vitamin D (CALCIUM CITRATE + D PO) Take 1 tablet by mouth daily.   . Cholecalciferol (VITAMIN D-3 PO) Take 1 capsule by mouth daily.  . Coenzyme Q10 (CO Q 10 PO) Take 1 tablet by mouth daily.   . Cyanocobalamin (B-12 PO) Take 1 tablet by mouth daily.  Marland Kitchen ezetimibe (ZETIA) 10  MG tablet Take 5 mg by mouth daily.  . fluticasone (FLONASE) 50 MCG/ACT nasal spray Place 2 sprays into the nose daily as needed. ALLERGIES  . furosemide (LASIX) 20 MG tablet Take 20 mg by mouth daily. IN THE AM  . Icosapent Ethyl (VASCEPA PO) Take by mouth.  . levocetirizine (XYZAL) 5 MG tablet Take 5 mg by mouth every evening.  . metoprolol tartrate (LOPRESSOR) 25 MG tablet Take 25 mg by mouth 2 (two) times daily.  . Multiple Vitamins-Minerals (MULTIVITAMINS THER. W/MINERALS) TABS Take 1 tablet by mouth daily.   . potassium chloride (KLOR-CON) 10 MEQ CR tablet Take 10 mEq by mouth daily.   .  [DISCONTINUED] cetirizine (ZYRTEC) 10 MG chewable tablet Chew 10 mg by mouth daily.  . [DISCONTINUED] fenofibrate (TRICOR) 48 MG tablet Take 48 mg by mouth every evening.   . [DISCONTINUED] fish oil-omega-3 fatty acids 1000 MG capsule Take 1 g by mouth 2 (two) times daily.   . [DISCONTINUED] HYDROcodone-acetaminophen (NORCO/VICODIN) 5-325 MG per tablet Take 1 tablet by mouth every 6 (six) hours as needed for moderate pain.  . [DISCONTINUED] traMADol (ULTRAM) 50 MG tablet Take 1 tablet (50 mg total) by mouth every 6 (six) hours as needed.   No facility-administered encounter medications on file as of 03/19/2016.    Allergies  Allergen Reactions  . Allopurinol Hives and Other (See Comments)    Cause renal failure   . Ampicillin Diarrhea and Nausea And Vomiting   Patient Active Problem List   Diagnosis Date Noted  . Sleep apnea with use of continuous positive airway pressure (CPAP) 03/19/2013  . Obstructive sleep apnea (adult) (pediatric) 02/05/2013  . Obstructive apnea   . Hypersomnia, persistent 01/01/2013  . Lab findings of hypoxemia    Social History   Social History  . Marital status: Married    Spouse name: [name]  . Number of children: 2  . Years of education: N/A   Occupational History  . Dermatologist    Social History Main Topics  . Smoking status: Never Smoker  . Smokeless tobacco: Never Used  . Alcohol use 8.4 oz/week    7 Glasses of wine, 7 Shots of liquor per week  . Drug use: No  . Sexual activity: Not on file   Other Topics Concern  . Not on file   Social History Narrative  . No narrative on file    Lance Martin family history is not on file.      Objective:    Vitals:   03/19/16 1000  BP: (!) 134/50  Pulse: 88    Physical Exam  well-developed elderly white male in no acute distress, pleasant blood pressure 134/50 pulse 88, BMI 25.4 03/13/1971. 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 Nausea seems to have improved with discontinuation of nicotine. Rule out esophageal stricture or underlying motility disorder #2 history of adenomatous colon polyps last colonoscopy 2014 #3 chronic kidney disease stage III #4 history of prostate cancer remote with radiation cystitis #5 history of subdural hematoma 2013 #  6 hypertension #7 sleep apnea #8 chronic EtOH use-recent elevation in LFTs consistent with EtOH-patient has been counseled  Plan; Will schedule for upper endoscopy with possible esophageal dilation with Dr. Scarlette Shorts. Procedure discussed in detail with patient and he is agreeable to proceed. Further plans pending results of EGD.  Vondell Sowell S Avin Upperman PA-C 03/19/2016   Cc: Crist Infante, MD

## 2016-03-25 NOTE — Progress Notes (Signed)
Agree with initial assessment and plans 

## 2016-04-02 ENCOUNTER — Ambulatory Visit (AMBULATORY_SURGERY_CENTER): Payer: Medicare Other | Admitting: Internal Medicine

## 2016-04-02 ENCOUNTER — Encounter: Payer: Self-pay | Admitting: Internal Medicine

## 2016-04-02 VITALS — BP 128/80 | HR 74 | Temp 98.0°F | Resp 16 | Ht 69.0 in | Wt 172.0 lb

## 2016-04-02 DIAGNOSIS — G4733 Obstructive sleep apnea (adult) (pediatric): Secondary | ICD-10-CM | POA: Diagnosis not present

## 2016-04-02 DIAGNOSIS — R634 Abnormal weight loss: Secondary | ICD-10-CM | POA: Diagnosis not present

## 2016-04-02 DIAGNOSIS — K2100 Gastro-esophageal reflux disease with esophagitis, without bleeding: Secondary | ICD-10-CM

## 2016-04-02 DIAGNOSIS — R1310 Dysphagia, unspecified: Secondary | ICD-10-CM | POA: Diagnosis not present

## 2016-04-02 DIAGNOSIS — I10 Essential (primary) hypertension: Secondary | ICD-10-CM | POA: Diagnosis not present

## 2016-04-02 MED ORDER — OMEPRAZOLE 40 MG PO CPDR: 40.0000 mg | DELAYED_RELEASE_CAPSULE | Freq: Every day | ORAL | 11 refills | Status: DC

## 2016-04-02 MED ORDER — SODIUM CHLORIDE 0.9 % IV SOLN: 500.0000 mL | INTRAVENOUS | Status: DC

## 2016-04-02 NOTE — Patient Instructions (Signed)
YOU HAD AN ENDOSCOPIC PROCEDURE TODAY AT [facility]:   Refer to the procedure report that was given to you for any specific questions about what was found during the examination.  If the procedure report does not answer your questions, please call your gastroenterologist to clarify.  If you requested that your care partner not be given the details of your procedure findings, then the procedure report has been included in a sealed envelope for you to review at your convenience later.  YOU SHOULD EXPECT: Some feelings of bloating in the abdomen. Passage of more gas than usual.  Walking can help get rid of the air that was put into your GI tract during the procedure and reduce the bloating. If you had a lower endoscopy (such as a colonoscopy or flexible sigmoidoscopy) you may notice spotting of blood in your stool or on the toilet paper. If you underwent a bowel prep for your procedure, you may not have a normal bowel movement for a few days.  Please Note:  You might notice some irritation and congestion in your nose or some drainage.  This is from the oxygen used during your procedure.  There is no need for concern and it should clear up in a day or so.  SYMPTOMS TO REPORT IMMEDIATELY:     Following upper endoscopy (EGD)  Vomiting of blood or coffee ground material  New chest pain or pain under the shoulder blades  Painful or persistently difficult swallowing  New shortness of breath  Fever of 100F or higher  Black, tarry-looking stools  For urgent or emergent issues, a gastroenterologist can be reached at any hour by calling [phone].   DIET:  We do recommend a small meal at first, but then you may proceed to your regular diet.  Drink plenty of fluids but you should avoid alcoholic beverages for 24 hours.  ACTIVITY:  You should plan to take it easy for the rest of today and you should NOT DRIVE or use heavy machinery until tomorrow (because of the sedation medicines  used during the test).    FOLLOW UP: Our staff will call the number listed on your records the next business day following your procedure to check on you and address any questions or concerns that you may have regarding the information given to you following your procedure. If we do not reach you, we will leave a message.  However, if you are feeling well and you are not experiencing any problems, there is no need to return our call.  We will assume that you have returned to your regular daily activities without incident.  If any biopsies were taken you will be contacted by phone or by letter within the next 1-3 weeks.  Please call us at [phone] if you have not heard about the biopsies in 3 weeks.    SIGNATURES/CONFIDENTIALITY: You and/or your care partner have signed paperwork which will be entered into your electronic medical record.  These signatures attest to the fact that that the information above on your After Visit Summary has been reviewed and is understood.  Full responsibility of the confidentiality of this discharge information lies with you and/or your care-partner.   Resume medications. Information given on Esophagitis. Follow up with Dr. Henrene Pastor in 2 Months.

## 2016-04-02 NOTE — Op Note (Signed)
[name]: Lance Martin Procedure Date: 04/02/2016 3:29 PM MRN: [id number] Endoscopist: Docia Chuck. Henrene Pastor , MD Age: 82 Referring MD:  Date of Birth: 11/20/1932 Gender: Male Account #: [id number] Procedure:                Upper GI endoscopy Indications:              Dysphagia Medicines:                Monitored Anesthesia Care Procedure:                Pre-Anesthesia Assessment:                           - Prior to the procedure, a History and Physical                            was performed, and patient medications and                            allergies were reviewed. The patient's tolerance of                            previous anesthesia was also reviewed. The risks                            and benefits of the procedure and the sedation                            options and risks were discussed with the patient.                            All questions were answered, and informed consent                            was obtained. Prior Anticoagulants: The patient has                            taken no previous anticoagulant or antiplatelet                            agents. ASA Grade Assessment: II - A patient with                            mild systemic disease. After reviewing the risks                            and benefits, the patient was deemed in                            satisfactory condition to undergo the procedure.                           After obtaining informed consent, the endoscope was  passed under direct vision. Throughout the                            procedure, the patient's blood pressure, pulse, and                            oxygen saturations were monitored continuously. The                            Model GIF-HQ190 [id number]) scope was introduced                            through the mouth, and advanced to the second part                            of duodenum. The upper GI endoscopy was                     accomplished without difficulty. The patient                            tolerated the procedure well. Scope In: Scope Out: Findings:                 Mildly severe esophagitis was found at the                            gastroesophageal junction as manifested by edema                            and one small erosion. The esophagus was otherwise                            normal.                           The stomach was normal.                           The examined duodenum was normal.                           The cardia and gastric fundus were normal on                            retroflexion. Complications:            No immediate complications. Estimated Blood Loss:     Estimated blood loss: none. Impression:               - Mildly severe reflux esophagitis.                           - Otherwise normal EGD. Recommendation:           - Prescribe omeprazole 40 mg daily; #30; 11 refills.                           -  Resume previous diet.                           - Please make a follow-up office visit with Dr.                            Henrene Pastor in 2 months. Docia Chuck. Henrene Pastor, MD 04/02/2016 3:54:12 PM This report has been signed electronically.

## 2016-04-02 NOTE — Progress Notes (Signed)
To recovery, report to [place], RN, VSS 

## 2016-04-03 ENCOUNTER — Telehealth: Payer: Self-pay

## 2016-04-03 NOTE — Telephone Encounter (Signed)
  Follow up Call-  Call back number 04/02/2016  Post procedure Call Back phone  # [phone]  Permission to leave phone message Yes  Some recent data might be hidden    Patient was called after his procedure on 04/02/2016. No answer at the number given for follow up phone call. A message was left on the answering machine.

## 2016-05-29 DIAGNOSIS — D696 Thrombocytopenia, unspecified: Secondary | ICD-10-CM | POA: Diagnosis not present

## 2016-05-29 DIAGNOSIS — R7301 Impaired fasting glucose: Secondary | ICD-10-CM | POA: Diagnosis not present

## 2016-05-29 DIAGNOSIS — E784 Other hyperlipidemia: Secondary | ICD-10-CM | POA: Diagnosis not present

## 2016-05-29 DIAGNOSIS — R634 Abnormal weight loss: Secondary | ICD-10-CM | POA: Diagnosis not present

## 2016-05-29 DIAGNOSIS — Z6825 Body mass index (BMI) 25.0-25.9, adult: Secondary | ICD-10-CM | POA: Diagnosis not present

## 2016-05-29 DIAGNOSIS — R945 Abnormal results of liver function studies: Secondary | ICD-10-CM | POA: Diagnosis not present

## 2016-05-29 DIAGNOSIS — Z23 Encounter for immunization: Secondary | ICD-10-CM | POA: Diagnosis not present

## 2016-05-29 DIAGNOSIS — K208 Other esophagitis: Secondary | ICD-10-CM | POA: Diagnosis not present

## 2016-05-30 DIAGNOSIS — E784 Other hyperlipidemia: Secondary | ICD-10-CM | POA: Diagnosis not present

## 2016-05-30 DIAGNOSIS — D696 Thrombocytopenia, unspecified: Secondary | ICD-10-CM | POA: Diagnosis not present

## 2016-05-30 DIAGNOSIS — R945 Abnormal results of liver function studies: Secondary | ICD-10-CM | POA: Diagnosis not present

## 2016-05-30 DIAGNOSIS — D649 Anemia, unspecified: Secondary | ICD-10-CM | POA: Diagnosis not present

## 2016-05-31 DIAGNOSIS — D696 Thrombocytopenia, unspecified: Secondary | ICD-10-CM | POA: Diagnosis not present

## 2016-05-31 DIAGNOSIS — D649 Anemia, unspecified: Secondary | ICD-10-CM | POA: Diagnosis not present

## 2016-05-31 DIAGNOSIS — D7589 Other specified diseases of blood and blood-forming organs: Secondary | ICD-10-CM | POA: Diagnosis not present

## 2016-06-04 ENCOUNTER — Ambulatory Visit (INDEPENDENT_AMBULATORY_CARE_PROVIDER_SITE_OTHER): Payer: Medicare Other | Admitting: Internal Medicine

## 2016-06-04 ENCOUNTER — Encounter (INDEPENDENT_AMBULATORY_CARE_PROVIDER_SITE_OTHER): Payer: Self-pay

## 2016-06-04 ENCOUNTER — Encounter: Payer: Self-pay | Admitting: Internal Medicine

## 2016-06-04 VITALS — BP 122/60 | HR 76 | Wt 173.5 lb

## 2016-06-04 DIAGNOSIS — R1310 Dysphagia, unspecified: Secondary | ICD-10-CM

## 2016-06-04 DIAGNOSIS — K2100 Gastro-esophageal reflux disease with esophagitis, without bleeding: Secondary | ICD-10-CM

## 2016-06-04 DIAGNOSIS — R1319 Other dysphagia: Secondary | ICD-10-CM

## 2016-06-04 DIAGNOSIS — R110 Nausea: Secondary | ICD-10-CM

## 2016-06-04 NOTE — Progress Notes (Signed)
HISTORY OF PRESENT ILLNESS:  Lance Martin is a 82 y.o. male , retired Paediatric nurse, who presents today for follow-up post endoscopy. He was evaluated by the physician assistant 03/19/2016 regarding nausea, mild weight loss, and intermittent dysphagia with regurgitation. Lance Martin impart felt secondary to supplement which was discontinued. Subsequently underwent upper endoscopy 04/02/2016. Examination revealed mild reflux esophagitis but was otherwise normal. He was prescribed omeprazole 40 mg daily and follows up at this time. Patient reports problems with nausea, dysphagia, and regurgitation have resolved. No further weight loss. No complaints. Tolerating medication well. Currently taking this with meals. Wonders if he may come off medication.  REVIEW OF SYSTEMS:  All non-GI ROS negative except for sinus allergy trouble, blood in urine, heart murmur  Past Medical History:  Diagnosis Date  . Arthritis   . AV block 05/13 ecg   1st degree  . Bleeding    bladder,radiation cystitis-s/p cystoscopy, clot evacuation, fulgeration of bleeders  . Cancer [facility])    prostate  . Gout   . Hematuria   . Hemorrhoid   . History of basal cell carcinoma excision 08-29-2011  left frontal scalp  . History of gout PER PT -  STABLE  . History of prostate cancer 2004   -  RX EXTERNAL RADIATION  . Hx of subdural hematoma 08-14-2011    s/p total left hip  12-92012---  resolved w/ no residual  . Hyperlipidemia   . Hypertension   . IFG (impaired fasting glucose)   . Lab findings of hypoxemia    abnormal ONO , dr perini. 05-26-2012  . Microalbuminuria 2011  . OA (osteoarthritis) of hip   . Obstructive apnea    AHI of 35 on 01-14-13 , titrated to 6 cm H20  . Shoulder impingement syndrome    right  . Sleep apnea    cpap nightly  . Sleep apnea with use of continuous positive airway pressure (CPAP) 03/19/2013    Past Surgical History:  Procedure Laterality Date  . CARDIAC CATHETERIZATION     pt denies  .  COLONOSCOPY    . CYSTOSCOPY WITH BIOPSY  07/03/2012   Procedure: CYSTOSCOPY WITH BIOPSY;  Surgeon: Franchot Gallo, MD;  Location: [facility];  Service: Urology;  Laterality: N/A;      . EYE SURGERY  2009   RIGHT CATARACT EXTRACTION  . LEFT ELBOW SURGERY  2009   ELBOW AND WRIST SURGERY  . RIGHT ELBOW SURGERY  MAR 2013  . TONSILLECTOMY     AGE 85  . TOTAL HIP ARTHROPLASTY  07/31/2011   Procedure: TOTAL HIP ARTHROPLASTY;  Surgeon: Dione Plover Aluisio;  Location: [facility];  Service: Orthopedics;  Laterality: Left;  . TOTAL HIP ARTHROPLASTY    . TRANSURETHRAL RESECTION OF PROSTATE  07/25/2012   Procedure: TRANSURETHRAL RESECTION OF THE PROSTATE WITH GYRUS INSTRUMENTS;  Surgeon: Alexis Frock, MD;  Location: [facility];  Service: Urology;  Laterality: N/A;  CYSTOSCOPY;CLOT EVACUATION WITH FULGERATION  . ULNAR NERVE REPAIR  04/13    Social History Lance Martin  reports that he has never smoked. He has never used smokeless tobacco. He reports that he drinks about 15.0 oz of alcohol per week . He reports that he does not use drugs.  family history is not on file.  Allergies  Allergen Reactions  . Allopurinol Hives and Other (See Comments)    Cause renal failure   . Ampicillin Diarrhea and Nausea And Vomiting       PHYSICAL EXAMINATION: Vital signs: BP  122/60   Pulse 76   Wt 173 lb 8 oz (78.7 kg)   BMI 25.62 kg/m  General: Well-developed, well-nourished, no acute distress HEENT: Sclerae are anicteric,  Abdomen: Not reexamined Psychiatric: alert and oriented x3. Cooperative   ASSESSMENT:  1. GERD with mild esophagitis on endoscopy 2. Symptoms of nausea, dysphagia, regurgitation, and weight loss have resolved. Possibly secondary to supplement and/or PPI  PLAN:  1. Reflux precautions 2. Advise if he may come off PPI to see if symptoms return. If so, resume PPI daily. Advised to take 30 minutes before a.m. meal on empty stomach 3. Return to the care of Dr. Joylene Draft.  GI follow-up as needed   15 minutes spent face-to-face with the patient. Greater than 50% a time use for counseling regarding GERD and associated symptom complex as outlined. We reviewed medication effect, side effects, and treatment plans moving forward

## 2016-06-04 NOTE — Patient Instructions (Signed)
Please follow up as needed 

## 2016-07-10 DIAGNOSIS — N3040 Irradiation cystitis without hematuria: Secondary | ICD-10-CM | POA: Diagnosis not present

## 2016-07-10 DIAGNOSIS — N35011 Post-traumatic bulbous urethral stricture: Secondary | ICD-10-CM | POA: Diagnosis not present

## 2016-07-10 DIAGNOSIS — C61 Malignant neoplasm of prostate: Secondary | ICD-10-CM | POA: Diagnosis not present

## 2016-11-26 DIAGNOSIS — R310 Gross hematuria: Secondary | ICD-10-CM | POA: Diagnosis not present

## 2016-11-27 ENCOUNTER — Other Ambulatory Visit: Payer: Self-pay | Admitting: Urology

## 2016-11-28 NOTE — Patient Instructions (Addendum)
XAVIOUS SHARRAR  11/28/2016   Your procedure is scheduled on: 12/02/2016    Report to [facility] Main  Entrance and take [facility] to 3rd floor to Short Stay  at 0530am.      Call this number if you have problems the morning of surgery [phone]    Remember: ONLY 1 PERSON MAY GO WITH YOU TO SHORT STAY TO GET  READY MORNING OF [place].  Do not eat food or drink liquids :After Midnight.     Take these medicines the morning of surgery with A SIP OF WATER: Flonase                                 You may not have any metal on your body including hair pins and              piercings  Do not wear jewelry, , lotions, powders or perfumes, deodorant                           Men may shave face and neck.   Do not bring valuables to the hospital. [place].  Contacts, dentures or bridgework may not be worn into surgery.      Patients discharged the day of surgery will not be allowed to drive home.  Name and phone number of your driver:  Special Instructions: N/A              Please read over the following fact sheets you were given: _____________________________________________________________________             [facility] - Preparing for Surgery Before surgery, you can play an important role.  Because skin is not sterile, your skin needs to be as free of germs as possible.  You can reduce the number of germs on your skin by washing with CHG (chlorahexidine gluconate) soap before surgery.  CHG is an antiseptic cleaner which kills germs and bonds with the skin to continue killing germs even after washing. Please DO NOT use if you have an allergy to CHG or antibacterial soaps.  If your skin becomes reddened/irritated stop using the CHG and inform your nurse when you arrive at Short Stay. Do not shave (including legs and underarms) for at least 48 hours prior to the first CHG shower.  You may shave your  face/neck. Please follow these instructions carefully:  1.  Shower with CHG Soap the night before surgery and the  morning of Surgery.  2.  If you choose to wash your hair, wash your hair first as usual with your  normal  shampoo.  3.  After you shampoo, rinse your hair and body thoroughly to remove the  shampoo.                           4.  Use CHG as you would any other liquid soap.  You can apply chg directly  to the skin and wash                       Gently with a scrungie or clean washcloth.  5.  Apply  the CHG Soap to your body ONLY FROM THE NECK DOWN.   Do not use on face/ open                           Wound or open sores. Avoid contact with eyes, ears mouth and genitals (private parts).                       Wash face,  Genitals (private parts) with your normal soap.             6.  Wash thoroughly, paying special attention to the area where your surgery  will be performed.  7.  Thoroughly rinse your body with warm water from the neck down.  8.  DO NOT shower/wash with your normal soap after using and rinsing off  the CHG Soap.                9.  Pat yourself dry with a clean towel.            10.  Wear clean pajamas.            11.  Place clean sheets on your bed the night of your first shower and do not  sleep with pets. Day of Surgery : Do not apply any lotions/deodorants the morning of surgery.  Please wear clean clothes to the hospital/surgery center.  FAILURE TO FOLLOW THESE INSTRUCTIONS MAY RESULT IN THE CANCELLATION OF YOUR SURGERY PATIENT SIGNATURE_________________________________  NURSE SIGNATURE__________________________________  ________________________________________________________________________

## 2016-11-29 ENCOUNTER — Encounter (HOSPITAL_COMMUNITY)
Admission: RE | Admit: 2016-11-29 | Discharge: 2016-11-29 | Disposition: A | Discharge status: Home / Self Care | Payer: Medicare Other | Source: Ambulatory Visit | Attending: Urology | Admitting: Urology

## 2016-11-29 ENCOUNTER — Encounter (HOSPITAL_COMMUNITY): Payer: Self-pay

## 2016-11-29 DIAGNOSIS — I4510 Unspecified right bundle-branch block: Secondary | ICD-10-CM | POA: Insufficient documentation

## 2016-11-29 DIAGNOSIS — N320 Bladder-neck obstruction: Secondary | ICD-10-CM | POA: Diagnosis not present

## 2016-11-29 DIAGNOSIS — I452 Bifascicular block: Secondary | ICD-10-CM

## 2016-11-29 DIAGNOSIS — I444 Left anterior fascicular block: Secondary | ICD-10-CM | POA: Insufficient documentation

## 2016-11-29 DIAGNOSIS — Z9989 Dependence on other enabling machines and devices: Secondary | ICD-10-CM | POA: Diagnosis not present

## 2016-11-29 DIAGNOSIS — Z01810 Encounter for preprocedural cardiovascular examination: Secondary | ICD-10-CM | POA: Insufficient documentation

## 2016-11-29 DIAGNOSIS — Z85828 Personal history of other malignant neoplasm of skin: Secondary | ICD-10-CM | POA: Diagnosis not present

## 2016-11-29 DIAGNOSIS — N3041 Irradiation cystitis with hematuria: Secondary | ICD-10-CM | POA: Diagnosis not present

## 2016-11-29 DIAGNOSIS — R310 Gross hematuria: Secondary | ICD-10-CM

## 2016-11-29 DIAGNOSIS — G4733 Obstructive sleep apnea (adult) (pediatric): Secondary | ICD-10-CM | POA: Diagnosis not present

## 2016-11-29 DIAGNOSIS — Z96642 Presence of left artificial hip joint: Secondary | ICD-10-CM | POA: Diagnosis not present

## 2016-11-29 DIAGNOSIS — N359 Urethral stricture, unspecified: Secondary | ICD-10-CM | POA: Diagnosis not present

## 2016-11-29 DIAGNOSIS — Z01812 Encounter for preprocedural laboratory examination: Secondary | ICD-10-CM

## 2016-11-29 DIAGNOSIS — I10 Essential (primary) hypertension: Secondary | ICD-10-CM | POA: Diagnosis not present

## 2016-11-29 DIAGNOSIS — Y842 Radiological procedure and radiotherapy as the cause of abnormal reaction of the patient, or of later complication, without mention of misadventure at the time of the procedure: Secondary | ICD-10-CM | POA: Diagnosis not present

## 2016-11-29 DIAGNOSIS — Z8546 Personal history of malignant neoplasm of prostate: Secondary | ICD-10-CM | POA: Diagnosis not present

## 2016-11-29 HISTORY — DX: Pneumonia, unspecified organism: J18.9

## 2016-11-29 LAB — BASIC METABOLIC PANEL
Anion gap: 7 (ref 5–15)
BUN: 19 mg/dL (ref 6–20)
CO2: 26 mmol/L (ref 22–32)
Calcium: 8.8 mg/dL — ABNORMAL LOW (ref 8.9–10.3)
Chloride: 103 mmol/L (ref 101–111)
Creatinine, Ser: 1.1 mg/dL (ref 0.61–1.24)
GFR calc Af Amer: >60 mL/min (ref >60)
GFR calc non Af Amer: >60 mL/min (ref >60)
GLUCOSE: 89 mg/dL (ref 65–99)
Potassium: 3.7 mmol/L (ref 3.5–5.1)
Sodium: 136 mmol/L (ref 135–145)

## 2016-11-29 LAB — CBC
HCT: 31.7 % — ABNORMAL LOW (ref 39.0–52.0)
HEMOGLOBIN: 11.2 g/dL — AB (ref 13.0–17.0)
MCH: 35 pg — AB (ref 26.0–34.0)
MCHC: 35.3 g/dL (ref 30.0–36.0)
MCV: 99.1 fL (ref 78.0–100.0)
Platelets: 120 10*3/uL — ABNORMAL LOW (ref 150–400)
RBC: 3.2 MIL/uL — ABNORMAL LOW (ref 4.22–5.81)
RDW: 12.8 % (ref 11.5–15.5)
WBC: 4.5 10*3/uL (ref 4.0–10.5)

## 2016-11-29 NOTE — Progress Notes (Signed)
CBC done 11/29/16 faxed via epic to Dr Diona Fanti.

## 2016-11-29 NOTE — Progress Notes (Signed)
Dr Kalman Shan aware of EKG done 11/29/16 and EKG from 2012 .  No new orders given.

## 2016-12-01 MED ORDER — GENTAMICIN SULFATE 40 MG/ML IJ SOLN
5.0000 mg/kg | INTRAMUSCULAR | Status: AC
  Administered 2016-12-02: 390 mg via INTRAVENOUS
  Filled 2016-12-01: qty 9.75

## 2016-12-01 NOTE — H&P (Signed)
Urology History and Physical Exam  CC: Gross hematuria  HPI: 83 year old male with history of radiotherapy for prostate cancer, with prior history of radiation-induced cystitis with hemorrhage, presents at this time for repeat cystoscopy and fulguration of bleeders.  He has had, over the past 2 weeks, persistent gross, painless hematuria.  This has not stopped with conservative management.  Thus, he presents for this procedure.  PMH: Past Medical History:  Diagnosis Date  . Arthritis   . AV block 05/13 ecg   1st degree  . Bleeding    bladder,radiation cystitis-s/p cystoscopy, clot evacuation, fulgeration of bleeders  . Cancer ([place])    prostate, hx of bsal cell skin cancer   . Gout   . Hematuria   . Hemorrhoid   . History of basal cell carcinoma excision 08-29-2011  left frontal scalp  . History of gout PER PT -  STABLE  . History of prostate cancer 2004   -  RX EXTERNAL RADIATION  . Hx of subdural hematoma 08-14-2011    s/p total left hip  12-92012---  resolved w/ no residual  . Hyperlipidemia   . Hypertension   . IFG (impaired fasting glucose)   . Lab findings of hypoxemia    abnormal ONO , dr perini. 05-26-2012  . Microalbuminuria 2011  . OA (osteoarthritis) of hip   . Obstructive apnea    AHI of 35 on 01-14-13 , titrated to 6 cm H20  . Pneumonia    hx of 2015   . Shoulder impingement syndrome    right  . Sleep apnea    cpap nightly  . Sleep apnea with use of continuous positive airway pressure (CPAP) 03/19/2013    PSH: Past Surgical History:  Procedure Laterality Date  . CARDIAC CATHETERIZATION     pt denies  . COLONOSCOPY    . CYSTOSCOPY WITH BIOPSY  07/03/2012   Procedure: CYSTOSCOPY WITH BIOPSY;  Surgeon: Franchot Gallo, MD;  Location: [facility];  Service: Urology;  Laterality: N/A;      . EYE SURGERY  2009   bilateral cataract surgery   . MOHS SURGERY    . RIGHT ELBOW SURGERY  MAR 2013  . right elbow surgery  2009   ELBOW AND WRIST  SURGERY  . TONSILLECTOMY     AGE 84  . TOTAL HIP ARTHROPLASTY  07/31/2011   Procedure: TOTAL HIP ARTHROPLASTY;  Surgeon: Dione Plover Aluisio;  Location: [facility];  Service: Orthopedics;  Laterality: Left;  . TOTAL HIP ARTHROPLASTY    . TRANSURETHRAL RESECTION OF PROSTATE  07/25/2012   Procedure: TRANSURETHRAL RESECTION OF THE PROSTATE WITH GYRUS INSTRUMENTS;  Surgeon: Alexis Frock, MD;  Location: [facility];  Service: Urology;  Laterality: N/A;  CYSTOSCOPY;CLOT EVACUATION WITH FULGERATION  . ULNAR NERVE REPAIR  04/13    Allergies: Allergies  Allergen Reactions  . Allopurinol Hives and Other (See Comments)    Cause renal failure   . Ampicillin Diarrhea and Nausea And Vomiting    Medications: No prescriptions prior to admission.     Social History: Social History   Social History  . Marital status: Married    Spouse name: [name]  . Number of children: 2  . Years of education: N/A   Occupational History  . Dermatologist    Social History Main Topics  . Smoking status: Never Smoker  . Smokeless tobacco: Never Used  . Alcohol use 15.0 oz/week    4 Glasses of wine, 21 Shots of liquor per week  Comment: 4 drinks per day , all in whiskey category, 1/2 glass of wine at dinner   . Drug use: No  . Sexual activity: Not on file   Other Topics Concern  . Not on file   Social History Narrative  . No narrative on file    Family History: Family History  Problem Relation Age of Onset  . Colon cancer Neg Hx   . Stomach cancer Neg Hx   . Esophageal cancer Neg Hx     Review of Systems: Positive: Gross hematuria Negative:   A further 10 point review of systems was negative except what is listed in the HPI.                  Physical Exam: @VITALS2 @ General: No acute distress.  Awake. Head:  Normocephalic.  Atraumatic. ENT:  EOMI.  Mucous membranes moist Neck:  Supple.  No lymphadenopathy. CV:  S1 present. S2 present. Regular rate. Pulmonary: Equal effort bilaterally.  Clear  to auscultation bilaterally. Abdomen: Soft.  Non- tender to palpation. Skin:  Normal turgor.  No visible rash. Extremity: No gross deformity of bilateral upper extremities.  No gross deformity of                             lower extremities. Neurologic: Alert. Appropriate mood.    Studies:  Recent Labs     11/29/16  1124  HGB  11.2*  WBC  4.5  PLT  120*    Recent Labs     11/29/16  1124  NA  136  K  3.7  CL  103  CO2  26  BUN  19  CREATININE  1.10  CALCIUM  8.8*  GFRNONAA  >60  GFRAA  >60     No results for input(s): INR, APTT in the last 72 hours.  Invalid input(s): PT   Invalid input(s): ABG    Assessment:  Radiation-induced hemorrhagic cystitis  Plan: Cystoscopy, fulguration of bleeders

## 2016-12-02 ENCOUNTER — Encounter (HOSPITAL_COMMUNITY)
Admission: RE | Disposition: A | Discharge status: Home / Self Care | Payer: Self-pay | Source: Ambulatory Visit | Attending: Urology

## 2016-12-02 ENCOUNTER — Encounter (HOSPITAL_COMMUNITY): Payer: Self-pay | Admitting: Certified Registered Nurse Anesthetist

## 2016-12-02 ENCOUNTER — Ambulatory Visit (HOSPITAL_COMMUNITY)
Admission: RE | Admit: 2016-12-02 | Discharge: 2016-12-02 | Disposition: A | Discharge status: Home / Self Care | Payer: Medicare Other | Source: Ambulatory Visit | Attending: Urology | Admitting: Urology

## 2016-12-02 ENCOUNTER — Ambulatory Visit (HOSPITAL_COMMUNITY): Payer: Medicare Other | Admitting: Certified Registered Nurse Anesthetist

## 2016-12-02 DIAGNOSIS — Z9989 Dependence on other enabling machines and devices: Secondary | ICD-10-CM | POA: Diagnosis not present

## 2016-12-02 DIAGNOSIS — G4733 Obstructive sleep apnea (adult) (pediatric): Secondary | ICD-10-CM | POA: Insufficient documentation

## 2016-12-02 DIAGNOSIS — Z8546 Personal history of malignant neoplasm of prostate: Secondary | ICD-10-CM | POA: Insufficient documentation

## 2016-12-02 DIAGNOSIS — Z85828 Personal history of other malignant neoplasm of skin: Secondary | ICD-10-CM | POA: Diagnosis not present

## 2016-12-02 DIAGNOSIS — N359 Urethral stricture, unspecified: Secondary | ICD-10-CM | POA: Insufficient documentation

## 2016-12-02 DIAGNOSIS — G4710 Hypersomnia, unspecified: Secondary | ICD-10-CM | POA: Diagnosis not present

## 2016-12-02 DIAGNOSIS — I10 Essential (primary) hypertension: Secondary | ICD-10-CM | POA: Insufficient documentation

## 2016-12-02 DIAGNOSIS — N3041 Irradiation cystitis with hematuria: Secondary | ICD-10-CM | POA: Insufficient documentation

## 2016-12-02 DIAGNOSIS — N4289 Other specified disorders of prostate: Secondary | ICD-10-CM | POA: Diagnosis not present

## 2016-12-02 DIAGNOSIS — Z96642 Presence of left artificial hip joint: Secondary | ICD-10-CM | POA: Insufficient documentation

## 2016-12-02 DIAGNOSIS — R310 Gross hematuria: Secondary | ICD-10-CM | POA: Diagnosis not present

## 2016-12-02 DIAGNOSIS — N35011 Post-traumatic bulbous urethral stricture: Secondary | ICD-10-CM | POA: Diagnosis not present

## 2016-12-02 DIAGNOSIS — N320 Bladder-neck obstruction: Secondary | ICD-10-CM | POA: Insufficient documentation

## 2016-12-02 DIAGNOSIS — Y842 Radiological procedure and radiotherapy as the cause of abnormal reaction of the patient, or of later complication, without mention of misadventure at the time of the procedure: Secondary | ICD-10-CM | POA: Insufficient documentation

## 2016-12-02 DIAGNOSIS — N3040 Irradiation cystitis without hematuria: Secondary | ICD-10-CM | POA: Diagnosis not present

## 2016-12-02 HISTORY — PX: CYSTOSCOPY WITH FULGERATION: SHX6638

## 2016-12-02 SURGERY — CYSTOSCOPY, WITH BLADDER FULGURATION
Anesthesia: General

## 2016-12-02 MED ORDER — HYDROMORPHONE HCL 1 MG/ML IJ SOLN
INTRAMUSCULAR | Status: AC
  Filled 2016-12-02: qty 1

## 2016-12-02 MED ORDER — ONDANSETRON HCL 4 MG/2ML IJ SOLN
INTRAMUSCULAR | Status: AC
  Filled 2016-12-02: qty 2

## 2016-12-02 MED ORDER — STERILE WATER FOR IRRIGATION IR SOLN
Status: DC | PRN
  Administered 2016-12-02: 6000 mL

## 2016-12-02 MED ORDER — PROPOFOL 10 MG/ML IV BOLUS
INTRAVENOUS | Status: DC | PRN
  Administered 2016-12-02: 150 mg via INTRAVENOUS

## 2016-12-02 MED ORDER — LACTATED RINGERS IV SOLN
INTRAVENOUS | Status: DC | PRN
  Administered 2016-12-02: 08:00:00 via INTRAVENOUS

## 2016-12-02 MED ORDER — HYDROMORPHONE HCL 1 MG/ML IJ SOLN
0.2500 mg | INTRAMUSCULAR | Status: DC | PRN
  Administered 2016-12-02 (×4): 0.5 mg via INTRAVENOUS

## 2016-12-02 MED ORDER — PROMETHAZINE HCL 25 MG/ML IJ SOLN: 6.2500 mg | INTRAMUSCULAR | Status: DC | PRN

## 2016-12-02 MED ORDER — ONDANSETRON HCL 4 MG/2ML IJ SOLN
INTRAMUSCULAR | Status: DC | PRN
  Administered 2016-12-02: 4 mg via INTRAVENOUS

## 2016-12-02 MED ORDER — FENTANYL CITRATE (PF) 100 MCG/2ML IJ SOLN
INTRAMUSCULAR | Status: AC
  Filled 2016-12-02: qty 2

## 2016-12-02 MED ORDER — OXYCODONE HCL 5 MG/5ML PO SOLN: 5.0000 mg | Freq: Once | ORAL | Status: AC | PRN

## 2016-12-02 MED ORDER — LIDOCAINE 2% (20 MG/ML) 5 ML SYRINGE
INTRAMUSCULAR | Status: AC
  Filled 2016-12-02: qty 5

## 2016-12-02 MED ORDER — SULFAMETHOXAZOLE-TRIMETHOPRIM 800-160 MG PO TABS: 1.0000 | ORAL_TABLET | Freq: Two times a day (BID) | ORAL | 0 refills | Status: DC

## 2016-12-02 MED ORDER — FENTANYL CITRATE (PF) 250 MCG/5ML IJ SOLN
INTRAMUSCULAR | Status: DC | PRN
  Administered 2016-12-02 (×4): 50 ug via INTRAVENOUS

## 2016-12-02 MED ORDER — LIDOCAINE HCL (CARDIAC) 20 MG/ML IV SOLN
INTRAVENOUS | Status: DC | PRN
  Administered 2016-12-02: 40 mg via INTRAVENOUS

## 2016-12-02 MED ORDER — PROPOFOL 10 MG/ML IV BOLUS
INTRAVENOUS | Status: AC
  Filled 2016-12-02: qty 20

## 2016-12-02 MED ORDER — OXYCODONE HCL 5 MG PO TABS
5.0000 mg | ORAL_TABLET | Freq: Once | ORAL | Status: AC | PRN
  Administered 2016-12-02: 5 mg via ORAL
  Filled 2016-12-02: qty 1

## 2016-12-02 SURGICAL SUPPLY — 22 items
BAG URINE DRAINAGE (UROLOGICAL SUPPLIES) ×2 IMPLANT
BAG URO CATCHER STRL LF (MISCELLANEOUS) ×3 IMPLANT
CATH FOLEY 2WAY SLVR  5CC 20FR (CATHETERS) ×2
CATH FOLEY 2WAY SLVR 30CC 24FR (CATHETERS) IMPLANT
CATH FOLEY 2WAY SLVR 5CC 20FR (CATHETERS) IMPLANT
COVER SURGICAL LIGHT HANDLE (MISCELLANEOUS) ×3 IMPLANT
ELECT REM PT RETURN 15FT ADLT (MISCELLANEOUS) ×3 IMPLANT
EVACUATOR MICROVAS BLADDER (UROLOGICAL SUPPLIES) IMPLANT
GLOVE BIOGEL M 8.0 STRL (GLOVE) ×3 IMPLANT
GOWN STRL REUS W/ TWL XL LVL3 (GOWN DISPOSABLE) ×1 IMPLANT
GOWN STRL REUS W/TWL XL LVL3 (GOWN DISPOSABLE) ×6 IMPLANT
LOOP CUT BIPOLAR 24F LRG (ELECTROSURGICAL) IMPLANT
LOOP MONOPOLAR YLW (ELECTROSURGICAL) ×2 IMPLANT
MANIFOLD NEPTUNE II (INSTRUMENTS) ×3 IMPLANT
NDL SAFETY ECLIPSE 18X1.5 (NEEDLE) ×1 IMPLANT
NEEDLE HYPO 18GX1.5 SHARP (NEEDLE)
PACK CYSTO (CUSTOM PROCEDURE TRAY) ×3 IMPLANT
SET ASPIRATION TUBING (TUBING) IMPLANT
SYRINGE IRR TOOMEY STRL 70CC (SYRINGE) IMPLANT
TUBING CONNECTING 10 (TUBING) ×2 IMPLANT
TUBING CONNECTING 10' (TUBING) ×1
WATER STERILE IRR 3000ML UROMA (IV SOLUTION) ×1 IMPLANT

## 2016-12-02 NOTE — Transfer of Care (Signed)
Immediate Anesthesia Transfer of Care Note  Patient: Lance Martin  Procedure(s) Performed: Procedure(s): [place] OF BLEEDERS AND TRANSURETRAL INCISION OF PROSTATE (N/A)  Patient Location: PACU  Anesthesia Type:General  Level of Consciousness: awake, alert  and oriented  Airway & Oxygen Therapy: Patient Spontanous Breathing  Post-op Assessment: Report given to RN and Post -op Vital signs reviewed and stable  Post vital signs: Reviewed and stable  Last Vitals:  Vitals:   12/02/16 0606  BP: (!) 143/62  Pulse: 68  Resp: 18  Temp: 36.4 C    Last Pain:  Vitals:   12/02/16 0606  TempSrc: Oral         Complications: No apparent anesthesia complications

## 2016-12-02 NOTE — Anesthesia Postprocedure Evaluation (Signed)
Anesthesia Post Note  Patient: Lance Martin  Procedure(s) Performed: Procedure(s) (LRB): CYSTOSCOPY WITH FULGERATION OF BLEEDERS AND TRANSURETRAL INCISION OF PROSTATE (N/A)  Patient location during evaluation: PACU Anesthesia Type: General Level of consciousness: awake and alert Pain management: pain level controlled Vital Signs Assessment: post-procedure vital signs reviewed and stable Respiratory status: spontaneous breathing, nonlabored ventilation and respiratory function stable Cardiovascular status: blood pressure returned to baseline and stable Postop Assessment: no signs of nausea or vomiting Anesthetic complications: no       Last Vitals:  Vitals:   12/02/16 0935 12/02/16 0944  BP:  (!) 133/56  Pulse: 62   Resp: 12 12  Temp:  36.4 C    Last Pain:  Vitals:   12/02/16 0952  TempSrc:   PainSc: [place]

## 2016-12-02 NOTE — Anesthesia Procedure Notes (Signed)
Procedure Name: LMA Insertion Date/Time: 12/02/2016 7:57 AM Performed by: Chandra Batch A Pre-anesthesia Checklist: Timeout performed, Patient being monitored, Suction available, Emergency Drugs available and Patient identified Patient Re-evaluated:Patient Re-evaluated prior to inductionOxygen Delivery Method: Circle system utilized Preoxygenation: Pre-oxygenation with 100% oxygen Intubation Type: IV induction Ventilation: Mask ventilation without difficulty LMA: LMA inserted LMA Size: 4.0 Number of attempts: 1 Placement Confirmation: positive ETCO2,  CO2 detector and breath sounds checked- equal and bilateral Dental Injury: Teeth and Oropharynx as per pre-operative assessment

## 2016-12-02 NOTE — Op Note (Signed)
Preoperative diagnosis: Radiation-induced bladder hemorrhage, persistent.  Postoperative diagnosis: Same, with bladder outlet obstruction secondary to bladder neck contracture, mild bulbous urethral stricture  Principal procedure: Cystoscopy, urethral dilation up to 71 [place] with them Bu[place] sounds, cauterization of bladder and bladder neck.  Bleeders, transurethral incision of prostate  Surgeon: Lindia Garms, para anesthesia: Gen. with LMA  Complications: None  Estimated blood loss: Less than 10 mL  Specimen: Prostate chips, to pathology  Drains: 20 French Foley catheter to leg bag.    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  The patient has had external beam radiotherapy for prostate cancer.  He has had intermittent gross painless hematuria.  The patient has had prior cystoscopy and cauterization of bleeders.  He presents at this time with 2 weeks of persistent bleeding and passing small clots.  The procedure of cystoscopy and cauterization of bleeders have been discussed with Dr. Rosana Hoes.  Risks and complications have also been discussed.  He presents at this time for that procedure.  Findings: Mild bulbous urethral stricture which was passed with the 81 [place] scope.  However, this was dilated to 81 [place] with [employer].  There was a bladder neck contracture with a high riding bladder neck.  There were no prostatic urethral lesions.  Scattered telangiectatic vessels noted in the bladder neck area and some superior to the trigone.  No specific bladder lesions were noted.  Clear urine was seen effluxing from each ureter.  There was a large residual urine volume at the time of cystoscopy.  Description of procedure: The patient was properly identified in the holding area.  He was taken the operating room where general anesthetic was administered.  Preoperative IV gentamicin was administered.  He is placed in the dorsolithotomy position.  Genitalia  and perineum were prepped and draped.  Proper timeout was performed.  A 21 French panendoscope was advanced under direct vision with the Foroblique lens.  The previously noted mild bulbous urethral stricture was noted.  The bladder was inspected circumferentially with the above findings.  After urethral dilation to 28 [place] with [employer] sounds, the resectoscope sheath was placed using the visual obturator.  The cutting loop was placed.  Small telangiectatic vessels were carefully cauterized.  Care was taken to avoid cauterization around the ureteral orifices, which were not involved with this process.  The bladder neck, both posteriorly and anteriorly was cauterized.  There were a fair amount of telangiectatic vessels at the bladder neck anteriorly which were thoroughly coagulated.  Following treatment of all abnormal urothelium in the bladder, it was felt that, with a large residual urine volume and is high riding bladder neck with a moderate bladder neck contracture, transurethral incision of this bladder neck/prostate should be performed.  This was performed in the 6 o'clock position from the bladder neck down to the mid prostatic urethra.  Approximately 4 prostatic chips were produced from this.  They were taken from the bladder and sent for permanent pathology.  Careful inspection of the incision site following cauterization was carried out.  At this point, careful inspection of the bladder and prostatic urethra was again carried out, no bleeding was seen.  The scope was removed.  I placed a 20 French Foley catheter, and filled the balloon with 10 mL of water.  This was then hooked to a leg bag.  Per the patient tolerated procedure well.  Was then awakened and taken to the PACU in stable condition.

## 2016-12-02 NOTE — Anesthesia Preprocedure Evaluation (Signed)
Anesthesia Evaluation  Patient identified by MRN, date of birth, ID band Patient awake    Reviewed: Allergy & Precautions, H&P , NPO status , Patient's Chart, lab work & pertinent test results  Airway Mallampati: II  TM Distance: >3 FB Neck ROM: Full    Dental no notable dental hx.    Pulmonary neg pulmonary ROS,    Pulmonary exam normal breath sounds clear to auscultation       Cardiovascular hypertension, Pt. on medications Normal cardiovascular exam Rhythm:Regular Rate:Normal     Neuro/Psych negative neurological ROS  negative psych ROS   GI/Hepatic negative GI ROS, Neg liver ROS,   Endo/Other  negative endocrine ROS  Renal/GU Renal InsufficiencyRenal disease  negative genitourinary   Musculoskeletal negative musculoskeletal ROS (+)   Abdominal   Peds negative pediatric ROS (+)  Hematology negative hematology ROS (+)   Anesthesia Other Findings   Reproductive/Obstetrics negative OB ROS                             Anesthesia Physical  Anesthesia Plan  ASA: II  Anesthesia Plan: General   Post-op Pain Management:    Induction: Intravenous  Airway Management Planned: LMA  Additional Equipment:   Intra-op Plan:   Post-operative Plan:   Informed Consent: I have reviewed the patients History and Physical, chart, labs and discussed the procedure including the risks, benefits and alternatives for the proposed anesthesia with the patient or authorized representative who has indicated his/her understanding and acceptance.   Dental advisory given  Plan Discussed with: CRNA and Surgeon  Anesthesia Plan Comments: (5 LMA used previously)        Anesthesia Quick Evaluation

## 2016-12-02 NOTE — Discharge Instructions (Signed)
1. You may see some blood in the urine and may have some burning with urination for 48-72 hours. You also may notice that you have to urinate more frequently or urgently after your procedure which is normal.  2. You should call should you develop an inability urinate, fever > 101, persistent nausea and vomiting that prevents you from eating or drinking to stay hydrated.  3. If you have a stent, you will likely urinate more frequently and urgently until the stent is removed and you may experience some discomfort/pain in the lower abdomen and flank especially when urinating. You may take pain medication prescribed to you if needed for pain. You may also intermittently have blood in the urine until the stent is removed. 4. If you have a catheter, you will be taught how to take care of the catheter by the nursing staff prior to discharge from the hospital.  You may periodically feel a strong urge to void with the catheter in place.  This is a bladder spasm and most often can occur when having a bowel movement or moving around. It is typically self-limited and usually will stop after a few minutes.  You may use some Vaseline or Neosporin around the tip of the catheter to reduce friction at the tip of the penis. You may also see some blood in the urine.  A very small amount of blood can make the urine look quite red.  As long as the catheter is draining well, there usually is not a problem.  However, if the catheter is not draining well and is bloody, you should call the office [phone]) to notify us.   Indwelling Urinary Catheter Care, Adult Take good care of your catheter to keep it working and to prevent problems. How to wear your catheter Attach your catheter to your leg with tape (adhesive tape) or a leg strap. Make sure it is not too tight. If you use tape, remove any bits of tape that are already on the catheter. How to wear a drainage bag You should have:  A large overnight bag.  A small leg  bag. Overnight Bag  You may wear the overnight bag at any time. Always keep the bag below the level of your bladder but off the floor. When you sleep, put a clean plastic bag in a wastebasket. Then hang the bag inside the wastebasket. Leg Bag  Never wear the leg bag at night. Always wear the leg bag below your knee. Keep the leg bag secure with a leg strap or tape. How to care for your skin  Clean the skin around the catheter at least once every day.  Shower every day. Do not take baths.  Put creams, lotions, or ointments on your genital area only as told by your doctor.  Do not use powders, sprays, or lotions on your genital area. How to clean your catheter and your skin 1. Wash your hands with soap and water. 2. Wet a washcloth in warm water and gentle (mild) soap. 3. Use the washcloth to clean the skin where the catheter enters your body. Clean downward and wipe away from the catheter in small circles. Do not wipe toward the catheter. 4. Pat the area dry with a clean towel. Make sure to clean off all soap. How to care for your drainage bags Empty your drainage bag when it is ?- full or at least 2-3 times a day. Replace your drainage bag once a month or sooner if it starts  to smell bad or look dirty. Do not clean your drainage bag unless told by your doctor. Emptying a drainage bag   Supplies Needed  Rubbing alcohol.  Gauze pad or cotton ball.  Tape or a leg strap. Steps 1. Wash your hands with soap and water. 2. Separate (detach) the bag from your leg. 3. Hold the bag over the toilet or a clean container. Keep the bag below your hips and bladder. This stops pee (urine) from going back into the tube. 4. Open the pour spout at the bottom of the bag. 5. Empty the pee into the toilet or container. Do not let the pour spout touch any surface. 6. Put rubbing alcohol on a gauze pad or cotton ball. 7. Use the gauze pad or cotton ball to clean the pour spout. 8. Close the pour  spout. 9. Attach the bag to your leg with tape or a leg strap. 10. Wash your hands. Changing a drainage bag  Supplies Needed  Alcohol wipes.  A clean drainage bag.  Adhesive tape or a leg strap. Steps 1. Wash your hands with soap and water. 2. Separate the dirty bag from your leg. 3. Pinch the rubber catheter with your fingers so that pee does not spill out. 4. Separate the catheter tube from the drainage tube where these tubes connect (at the connection valve). Do not let the tubes touch any surface. 5. Clean the end of the catheter tube with an alcohol wipe. Use a different alcohol wipe to clean the end of the drainage tube. 6. Connect the catheter tube to the drainage tube of the clean bag. 7. Attach the new bag to the leg with adhesive tape or a leg strap. 8. Wash your hands. How to prevent infection and other problems  Never pull on your catheter or try to remove it. Pulling can damage tissue in your body.  Always wash your hands before and after touching your catheter.  If a leg strap gets wet, replace it with a dry one.  Drink enough fluids to keep your pee clear or pale yellow, or as told by your doctor.  Do not let the drainage bag or tubing touch the floor.  Wear cotton underwear.  If you are male, wipe from front to back after you poop (have a bowel movement).  Check on the catheter often to make sure it works and the tubing is not twisted. Get help if:  Your pee is cloudy.  Your pee smells unusually bad.  Your pee is not draining into the bag.  Your tube gets clogged.  Your catheter starts to leak.  Your bladder feels full. Get help right away if:  You have redness, swelling, or pain where the catheter enters your body.  You have fluid, pus, or a bad smell coming from the area where the catheter enters your body.  The area where the catheter enters your body feels warm.  You have a fever.  You have pain in your:  Stomach  (abdomen).  Legs.  Lower back.  Bladder.  You see blood fill the catheter.  Your pee is pink or red.  You feel sick to your stomach (nauseous).  You throw up (vomit).  You have chills.  Your catheter gets pulled out. This information is not intended to replace advice given to you by your health care provider. Make sure you discuss any questions you have with your health care provider. Document Released: 11/23/2012 Document Revised: 06/26/2016 Document Reviewed: 01/11/2014 Elsevier  Interactive Patient Education  2017 [employer].

## 2016-12-02 NOTE — Interval H&P Note (Signed)
History and Physical Interval Note:  12/02/2016 7:41 AM  Lance Martin  has presented today for surgery, with the diagnosis of GROSS HEMATURIA  The various methods of treatment have been discussed with the patient and family. After consideration of risks, benefits and other options for treatment, the patient has consented to  Procedure(s): [place] (N/A) as a surgical intervention .  The patient's history has been reviewed, patient examined, no change in status, stable for surgery.  I have reviewed the patient's chart and labs.  Questions were answered to the patient's satisfaction.     Jorja Loa

## 2016-12-12 DIAGNOSIS — N3040 Irradiation cystitis without hematuria: Secondary | ICD-10-CM | POA: Diagnosis not present

## 2016-12-12 DIAGNOSIS — R300 Dysuria: Secondary | ICD-10-CM | POA: Diagnosis not present

## 2016-12-23 DIAGNOSIS — N3040 Irradiation cystitis without hematuria: Secondary | ICD-10-CM | POA: Diagnosis not present

## 2017-02-04 DIAGNOSIS — D225 Melanocytic nevi of trunk: Secondary | ICD-10-CM | POA: Diagnosis not present

## 2017-02-04 DIAGNOSIS — L570 Actinic keratosis: Secondary | ICD-10-CM | POA: Diagnosis not present

## 2017-02-04 DIAGNOSIS — L821 Other seborrheic keratosis: Secondary | ICD-10-CM | POA: Diagnosis not present

## 2017-02-04 DIAGNOSIS — C4441 Basal cell carcinoma of skin of scalp and neck: Secondary | ICD-10-CM | POA: Diagnosis not present

## 2017-02-04 DIAGNOSIS — L814 Other melanin hyperpigmentation: Secondary | ICD-10-CM | POA: Diagnosis not present

## 2017-02-05 ENCOUNTER — Other Ambulatory Visit: Payer: Self-pay | Admitting: Dermatology

## 2017-02-05 DIAGNOSIS — C4441 Basal cell carcinoma of skin of scalp and neck: Secondary | ICD-10-CM | POA: Diagnosis not present

## 2017-02-14 DIAGNOSIS — N35010 Post-traumatic urethral stricture, male, meatal: Secondary | ICD-10-CM | POA: Diagnosis not present

## 2017-02-19 DIAGNOSIS — R310 Gross hematuria: Secondary | ICD-10-CM | POA: Diagnosis not present

## 2017-02-19 DIAGNOSIS — R300 Dysuria: Secondary | ICD-10-CM | POA: Diagnosis not present

## 2017-02-19 DIAGNOSIS — N35010 Post-traumatic urethral stricture, male, meatal: Secondary | ICD-10-CM | POA: Diagnosis not present

## 2017-02-19 DIAGNOSIS — N3040 Irradiation cystitis without hematuria: Secondary | ICD-10-CM | POA: Diagnosis not present

## 2017-03-10 DIAGNOSIS — H353131 Nonexudative age-related macular degeneration, bilateral, early dry stage: Secondary | ICD-10-CM | POA: Diagnosis not present

## 2017-03-10 DIAGNOSIS — H52203 Unspecified astigmatism, bilateral: Secondary | ICD-10-CM | POA: Diagnosis not present

## 2017-03-10 DIAGNOSIS — Z961 Presence of intraocular lens: Secondary | ICD-10-CM | POA: Diagnosis not present

## 2017-03-10 DIAGNOSIS — D3131 Benign neoplasm of right choroid: Secondary | ICD-10-CM | POA: Diagnosis not present

## 2017-03-25 ENCOUNTER — Other Ambulatory Visit: Payer: Self-pay | Admitting: Dermatology

## 2017-03-25 DIAGNOSIS — Z85828 Personal history of other malignant neoplasm of skin: Secondary | ICD-10-CM | POA: Diagnosis not present

## 2017-03-25 DIAGNOSIS — L905 Scar conditions and fibrosis of skin: Secondary | ICD-10-CM | POA: Diagnosis not present

## 2017-03-25 DIAGNOSIS — C44519 Basal cell carcinoma of skin of other part of trunk: Secondary | ICD-10-CM | POA: Diagnosis not present

## 2017-04-04 DIAGNOSIS — C61 Malignant neoplasm of prostate: Secondary | ICD-10-CM | POA: Diagnosis not present

## 2017-04-04 DIAGNOSIS — N35010 Post-traumatic urethral stricture, male, meatal: Secondary | ICD-10-CM | POA: Diagnosis not present

## 2017-04-04 DIAGNOSIS — N3040 Irradiation cystitis without hematuria: Secondary | ICD-10-CM | POA: Diagnosis not present

## 2017-04-08 DIAGNOSIS — N390 Urinary tract infection, site not specified: Secondary | ICD-10-CM | POA: Diagnosis not present

## 2017-04-08 DIAGNOSIS — E784 Other hyperlipidemia: Secondary | ICD-10-CM | POA: Diagnosis not present

## 2017-04-08 DIAGNOSIS — R8299 Other abnormal findings in urine: Secondary | ICD-10-CM | POA: Diagnosis not present

## 2017-04-08 DIAGNOSIS — M109 Gout, unspecified: Secondary | ICD-10-CM | POA: Diagnosis not present

## 2017-04-08 DIAGNOSIS — N182 Chronic kidney disease, stage 2 (mild): Secondary | ICD-10-CM | POA: Diagnosis not present

## 2017-04-08 DIAGNOSIS — R7301 Impaired fasting glucose: Secondary | ICD-10-CM | POA: Diagnosis not present

## 2017-04-08 DIAGNOSIS — Z125 Encounter for screening for malignant neoplasm of prostate: Secondary | ICD-10-CM | POA: Diagnosis not present

## 2017-04-15 DIAGNOSIS — Z1389 Encounter for screening for other disorder: Secondary | ICD-10-CM | POA: Diagnosis not present

## 2017-04-15 DIAGNOSIS — I440 Atrioventricular block, first degree: Secondary | ICD-10-CM | POA: Diagnosis not present

## 2017-04-15 DIAGNOSIS — M109 Gout, unspecified: Secondary | ICD-10-CM | POA: Diagnosis not present

## 2017-04-15 DIAGNOSIS — N182 Chronic kidney disease, stage 2 (mild): Secondary | ICD-10-CM | POA: Diagnosis not present

## 2017-04-15 DIAGNOSIS — R7301 Impaired fasting glucose: Secondary | ICD-10-CM | POA: Diagnosis not present

## 2017-04-15 DIAGNOSIS — C61 Malignant neoplasm of prostate: Secondary | ICD-10-CM | POA: Diagnosis not present

## 2017-04-15 DIAGNOSIS — R945 Abnormal results of liver function studies: Secondary | ICD-10-CM | POA: Diagnosis not present

## 2017-04-15 DIAGNOSIS — Z23 Encounter for immunization: Secondary | ICD-10-CM | POA: Diagnosis not present

## 2017-04-15 DIAGNOSIS — D6489 Other specified anemias: Secondary | ICD-10-CM | POA: Diagnosis not present

## 2017-04-15 DIAGNOSIS — Z0000 Encounter for general adult medical examination without abnormal findings: Secondary | ICD-10-CM | POA: Diagnosis not present

## 2017-04-15 DIAGNOSIS — I7389 Other specified peripheral vascular diseases: Secondary | ICD-10-CM | POA: Diagnosis not present

## 2017-04-15 DIAGNOSIS — G4733 Obstructive sleep apnea (adult) (pediatric): Secondary | ICD-10-CM | POA: Diagnosis not present

## 2017-04-15 DIAGNOSIS — Z6825 Body mass index (BMI) 25.0-25.9, adult: Secondary | ICD-10-CM | POA: Diagnosis not present

## 2017-04-17 DIAGNOSIS — Z1212 Encounter for screening for malignant neoplasm of rectum: Secondary | ICD-10-CM | POA: Diagnosis not present

## 2017-05-27 DIAGNOSIS — M109 Gout, unspecified: Secondary | ICD-10-CM | POA: Diagnosis not present

## 2017-05-29 ENCOUNTER — Ambulatory Visit (INDEPENDENT_AMBULATORY_CARE_PROVIDER_SITE_OTHER): Payer: Medicare Other | Admitting: Family Medicine

## 2017-05-29 ENCOUNTER — Encounter: Payer: Self-pay | Admitting: Family Medicine

## 2017-05-29 ENCOUNTER — Ambulatory Visit (INDEPENDENT_AMBULATORY_CARE_PROVIDER_SITE_OTHER): Payer: Medicare Other

## 2017-05-29 VITALS — BP 132/78 | HR 83 | Temp 99.5°F | Resp 18 | Ht 70.0 in | Wt 165.4 lb

## 2017-05-29 DIAGNOSIS — M25422 Effusion, left elbow: Secondary | ICD-10-CM

## 2017-05-29 DIAGNOSIS — M7989 Other specified soft tissue disorders: Secondary | ICD-10-CM | POA: Diagnosis not present

## 2017-05-29 DIAGNOSIS — L03114 Cellulitis of left upper limb: Secondary | ICD-10-CM | POA: Diagnosis not present

## 2017-05-29 DIAGNOSIS — M25522 Pain in left elbow: Secondary | ICD-10-CM

## 2017-05-29 MED ORDER — HYDROCODONE-ACETAMINOPHEN 5-325 MG PO TABS: 1.0000 | ORAL_TABLET | Freq: Four times a day (QID) | ORAL | 0 refills | Status: DC | PRN

## 2017-05-29 MED ORDER — CEPHALEXIN 250 MG PO CAPS: 250.0000 mg | ORAL_CAPSULE | Freq: Four times a day (QID) | ORAL | 0 refills | Status: DC

## 2017-05-29 NOTE — Progress Notes (Signed)
10/18/20185:23 PM  Lance Martin July 07, 1933, 83 y.o. male [id number]  Chief Complaint  Patient presents with  . Elbow Injury    fell over weekend and cut elbow     HPI:   Patient is a 83 y.o. male who presents today for 5 days of worsening LEFT elbow pain, swelling and redness, he feel during the power outage and broke his fall with that arm. He suffered several skin lacerations, has been tending to them, still some oozing as he changes dressing, no purulent discharge, no blood thinners.   Depression screen PHQ 2/9 05/29/2017  Decreased Interest 0  Down, Depressed, Hopeless 0  PHQ - 2 Score 0    Allergies  Allergen Reactions  . Allopurinol Hives and Other (See Comments)    Cause renal failure   . Ampicillin Diarrhea and Nausea And Vomiting    Prior to Admission medications   Medication Sig Start Date End Date Taking? Authorizing Provider  amLODipine (NORVASC) 2.5 MG tablet Take 2.5 mg by mouth every evening.  11/25/16  Yes [provider]  atorvastatin (LIPITOR) 20 MG tablet Take 20 mg by mouth at bedtime.    Yes [provider]  benazepril (LOTENSIN) 40 MG tablet Take 40 mg by mouth at bedtime.    Yes [provider]  Coenzyme Q10 (COQ-10) 100 MG CAPS Take 1 Can by mouth daily.   Yes [provider]  Cyanocobalamin (B-12) 2500 MCG SUBL Place 2,500 mcg under the tongue daily.   Yes [provider]  ezetimibe (ZETIA) 10 MG tablet Take 5 mg by mouth daily.   Yes [provider]  Flaxseed, Linseed, (FLAXSEED OIL PO) Take 1,400 mg by mouth daily. Will stop prior to procedure   Yes [provider]  fluticasone (FLONASE) 50 MCG/ACT nasal spray Place 2 sprays into the nose daily. ALLERGIES   Yes [provider]  furosemide (LASIX) 20 MG tablet Take 20 mg by mouth daily. IN THE AM   Yes [provider]  metoprolol succinate (TOPROL-XL) 25 MG 24 hr tablet Take 25 mg by mouth every evening.   Yes [provider]  Multiple Vitamins-Minerals (MULTIVITAMINS THER. W/MINERALS) TABS Take 1 tablet by mouth daily.    Yes [provider]  potassium chloride (KLOR-CON) 10 MEQ CR tablet Take 10 mEq by mouth daily.    Yes [provider]  TURMERIC PO Take 1,000 mg by mouth 2 (two) times daily.   Yes [provider]  VASCEPA 1 g CAPS Take 1 g by mouth 2 (two) times daily. Will stop prior to procedure 11/23/16  Yes [provider]  levocetirizine (XYZAL) 5 MG tablet Take 5 mg by mouth at bedtime.     [provider]  sulfamethoxazole-trimethoprim (BACTRIM DS,SEPTRA DS) 800-160 MG tablet Take 1 tablet by mouth 2 (two) times daily. Patient not taking: Reported on 05/29/2017 12/02/16   Franchot Gallo, MD  SULFINPYRAZONE PO Take 200 mg by mouth daily.    [provider]    Past Medical History:  Diagnosis Date  . Arthritis   . AV block 05/13 ecg   1st degree  . Bleeding    bladder,radiation cystitis-s/p cystoscopy, clot evacuation, fulgeration of bleeders  . Cancer ([place])    prostate, hx of bsal cell skin cancer   . Cataract   . Gout   . Hematuria   . Hemorrhoid   . History of basal cell carcinoma excision 08-29-2011  left frontal scalp  .  History of gout PER PT -  STABLE  . History of prostate cancer 2004   -  RX EXTERNAL RADIATION  . Hx of subdural hematoma 08-14-2011    s/p total left hip  12-92012---  resolved w/ no residual  . Hyperlipidemia   . Hypertension   . IFG (impaired fasting glucose)   . Lab findings of hypoxemia    abnormal ONO , dr perini. 05-26-2012  . Microalbuminuria 2011  . OA (osteoarthritis) of hip   . Obstructive apnea    AHI of 35 on 01-14-13 , titrated to 6 cm H20  . Pneumonia    hx of 2015   . Shoulder impingement syndrome    right  . Sleep apnea    cpap nightly  . Sleep apnea with use of continuous positive airway pressure (CPAP) 03/19/2013    Past Surgical History:  Procedure Laterality Date  . CARDIAC  CATHETERIZATION     pt denies  . COLONOSCOPY    . CYSTOSCOPY WITH BIOPSY  07/03/2012   Procedure: CYSTOSCOPY WITH BIOPSY;  Surgeon: Franchot Gallo, MD;  [facility]: [facility];  Service: Urology;  Laterality: N/A;      . CYSTOSCOPY WITH FULGERATION N/A 12/02/2016   Procedure: CYSTOSCOPY WITH FULGERATION OF BLEEDERS AND TRANSURETRAL INCISION OF PROSTATE;  Surgeon: Franchot Gallo, MD;  Location: [facility];  Service: Urology;  Laterality: N/A;  . EYE SURGERY  2009   bilateral cataract surgery   . JOINT REPLACEMENT    . MOHS SURGERY    . PROSTATE SURGERY    . RIGHT ELBOW SURGERY  MAR 2013  . right elbow surgery  2009   ELBOW AND WRIST SURGERY  . TONSILLECTOMY     AGE 67  . TOTAL HIP ARTHROPLASTY  07/31/2011   Procedure: TOTAL HIP ARTHROPLASTY;  Surgeon: Dione Plover Aluisio;  Location: [facility];  Service: Orthopedics;  Laterality: Left;  . TOTAL HIP ARTHROPLASTY    . TRANSURETHRAL RESECTION OF PROSTATE  07/25/2012   Procedure: TRANSURETHRAL RESECTION OF THE PROSTATE WITH GYRUS INSTRUMENTS;  Surgeon: Alexis Frock, MD;  Location: [facility];  Service: Urology;  Laterality: N/A;  CYSTOSCOPY;CLOT EVACUATION WITH FULGERATION  . ULNAR NERVE REPAIR  04/13    Social History  Substance Use Topics  . Smoking status: Never Smoker  . Smokeless tobacco: Never Used  . Alcohol use 15.0 oz/week    4 Glasses of wine, 21 Shots of liquor per week     Comment: 4 drinks per day , all in whiskey category, 1/2 glass of wine at dinner     Family History  Problem Relation Age of Onset  . Colon cancer Neg Hx   . Stomach cancer Neg Hx   . Esophageal cancer Neg Hx     Review of Systems  Constitutional: Negative for chills and fever.  Gastrointestinal: Negative for abdominal pain, nausea and vomiting.  Musculoskeletal: Positive for falls and joint pain.     OBJECTIVE:  Blood pressure 132/78, pulse 83, temperature 99.5 F (37.5 C), temperature source Oral, resp. rate 18, height 5\' 10"  (1.778  m), weight 165 lb 6.4 oz (75 kg), SpO2 93 %.  Physical Exam  Constitutional: He is oriented to person, place, and time and well-developed, well-nourished, and in no distress.  HENT:  Head: Normocephalic and atraumatic.  Mouth/Throat: Oropharynx is clear and moist.  Eyes: Pupils are equal, round, and reactive to light. EOM are normal.  Neck: Neck supple.  Pulmonary/Chest: Effort normal.  Musculoskeletal:  Left elbow: He exhibits decreased range of motion, swelling and laceration. He exhibits no effusion and no deformity. Tenderness found.  Purpulish bruising, 4 lacerations along extensor surface of elbow and proximal forearm. Laceration along inferior aspect of olecranon with scant oozing after removal of adherent dressing. Largest laceration along extensor forearm, about 1.5 inches long, wound edges are not approximated, no purulent drainage, does have overlying thick adherent yellow eschar. Diffuse erythema and swelling.   Neurological: He is alert and oriented to person, place, and time. Gait normal.  Psychiatric: Mood and affect normal.  Nursing note and vitals reviewed.   No results found for this or any previous visit (from the past 24 hour(s)).  Dg Elbow Complete Left  Result Date: 05/29/2017 CLINICAL DATA:  Pain, swelling of left elbow EXAM: LEFT ELBOW - COMPLETE 3+ VIEW COMPARISON:  None. FINDINGS: No acute bony abnormality. Specifically, no fracture, subluxation, or dislocation. Soft tissues are intact. No joint effusion. Joint spaces are maintained. IMPRESSION: No acute bony abnormality. Electronically Signed   By: Rolm Baptise M.D.   On: 05/29/2017 12:09     ASSESSMENT and PLAN 1. Pain and swelling of elbow, left - DG Elbow Complete Left; Future - negative xray  Continue with wound care, RICE therapy, abx started.   2. Cellulitis of left upper extremity  Other orders - cephALEXin (KEFLEX) 250 MG capsule; Take 1 capsule (250 mg total) by mouth 4 (four) times  daily. - HYDROcodone-acetaminophen (NORCO/VICODIN) 5-325 MG tablet; Take 1 tablet by mouth every 6 (six) hours as needed for moderate pain.  Return in about 2 days (around 05/31/2017).    Rutherford Guys, MD [facility] at [place], [place] [address] Ph.  [phone] Fax [phone]

## 2017-05-29 NOTE — Patient Instructions (Signed)
     IF you received an x-ray today, you will receive an invoice from [facility]. Please contact [facility] at [phone] with questions or concerns regarding your invoice.   IF you received labwork today, you will receive an invoice from [facility]. Please contact [facility] at [phone] with questions or concerns regarding your invoice.   Our billing staff will not be able to assist you with questions regarding bills from these companies.  You will be contacted with the lab results as soon as they are available. The fastest way to get your results is to activate your My Chart account. Instructions are located on the last page of this paperwork. If you have not heard from us regarding the results in 2 weeks, please contact this office.     

## 2017-05-31 ENCOUNTER — Ambulatory Visit (INDEPENDENT_AMBULATORY_CARE_PROVIDER_SITE_OTHER): Payer: Medicare Other | Admitting: Physician Assistant

## 2017-05-31 ENCOUNTER — Encounter: Payer: Self-pay | Admitting: Physician Assistant

## 2017-05-31 VITALS — BP 140/82 | HR 76 | Temp 98.9°F | Resp 18 | Ht 70.0 in | Wt 167.6 lb

## 2017-05-31 DIAGNOSIS — S50311A Abrasion of right elbow, initial encounter: Secondary | ICD-10-CM

## 2017-05-31 NOTE — Progress Notes (Signed)
05/31/2017 10:51 AM   DOB: [birth year] / MRN: [id number]  SUBJECTIVE:  Lance Martin is a 83 y.o. male presenting for left elbow check. Per the last note from Dr. Pamella Martin: Patient is a 83 y.o. male who presents today for 5 days of worsening LEFT elbow pain, swelling and redness, he feel during the power outage and broke his fall with that arm. He suffered several skin lacerations, has been tending to them, still some oozing as he changes dressing, no purulent discharge, no blood thinners.   Today patient tells me that he is certainly feeling better.  Has been taking keflex as prescribed and using narcotic for pain relief as prescribed.     He is allergic to allopurinol and ampicillin.   He  has a past medical history of Arthritis; AV block (05/13 ecg); Bleeding; Cancer ([place]); Cataract; Gout; Hematuria; Hemorrhoid; History of basal cell carcinoma excision (08-29-2011  left frontal scalp); History of gout (PER PT -  STABLE); History of prostate cancer (2004   -  RX EXTERNAL RADIATION); subdural hematoma (08-14-2011    s/p total left hip  12-92012---  resolved w/ no residual); Hyperlipidemia; Hypertension; IFG (impaired fasting glucose); Lab findings of hypoxemia; Microalbuminuria (2011); OA (osteoarthritis) of hip; Obstructive apnea; Pneumonia; Shoulder impingement syndrome; Sleep apnea; and Sleep apnea with use of continuous positive airway pressure (CPAP) (03/19/2013).    He  reports that he has never smoked. He has never used smokeless tobacco. He reports that he drinks about 15.0 oz of alcohol per week . He reports that he does not use drugs. He  has no sexual activity history on file. The patient  has a past surgical history that includes Tonsillectomy; Total hip arthroplasty (07/31/2011); Total hip arthroplasty; RIGHT ELBOW SURGERY (MAR 2013); Cardiac catheterization; Cystoscopy with biopsy (07/03/2012); Transurethral resection of prostate (07/25/2012); Ulnar nerve repair (04/13); Colonoscopy;  Eye surgery (2009); right elbow surgery (2009); Mohs surgery; Cystoscopy with fulgeration (N/A, 12/02/2016); Prostate surgery; and Joint replacement.  His family history is not on file.  Review of Systems  Constitutional: Negative for chills, diaphoresis and fever.  Gastrointestinal: Negative for nausea.  Skin: Negative for rash.  Neurological: Negative for dizziness.    The problem list and medications were reviewed and updated by myself where necessary and exist elsewhere in the encounter.   OBJECTIVE:  BP 140/82 (BP Location: Right Arm, Patient Position: Sitting, Cuff Size: Normal)   Pulse 76   Temp 98.9 F (37.2 C) (Oral)   Resp 18   Ht 5\' 10"  (1.778 m)   Wt 167 lb 9.6 oz (76 kg)   SpO2 93%   BMI 24.05 kg/m   Physical Exam  Constitutional: He appears well-developed. He is active and cooperative.  Non-toxic appearance.  Cardiovascular: Normal rate, regular rhythm, S1 normal, S2 normal, normal heart sounds, intact distal pulses and normal pulses.  Exam reveals no gallop and no friction rub.   No murmur heard. Pulmonary/Chest: Effort normal. No stridor. No tachypnea. No respiratory distress. He has no wheezes. He has no rales.  Abdominal: He exhibits no distension.  Musculoskeletal: He exhibits no edema.  Neurological: He is alert.  Skin: Skin is warm and dry. He is not diaphoretic. No pallor.  Vitals reviewed.        No results found for this or any previous visit (from the past 72 hour(s)).  No results found.  ASSESSMENT AND PLAN:  Lance Martin was seen today for elbow injury and follow-up.  Diagnoses and all orders  for this visit:  Abrasion of right elbow, initial encounter Comments: Improving.  Pain improving.  Rewrapped.  Will see him back on Tuesday for a final check.     The patient is advised to call or return to clinic if he does not see an improvement in symptoms, or to seek the care of the closest emergency department if he worsens with the above plan.    Philis Fendt, MHS, PA-C [facility] 05/31/2017 10:51 AM

## 2017-05-31 NOTE — Patient Instructions (Signed)
     IF you received an x-ray today, you will receive an invoice from [facility]. Please contact [facility] at [phone] with questions or concerns regarding your invoice.   IF you received labwork today, you will receive an invoice from [facility]. Please contact [facility] at [phone] with questions or concerns regarding your invoice.   Our billing staff will not be able to assist you with questions regarding bills from these companies.  You will be contacted with the lab results as soon as they are available. The fastest way to get your results is to activate your My Chart account. Instructions are located on the last page of this paperwork. If you have not heard from us regarding the results in 2 weeks, please contact this office.     

## 2017-06-04 ENCOUNTER — Ambulatory Visit (INDEPENDENT_AMBULATORY_CARE_PROVIDER_SITE_OTHER): Payer: Medicare Other | Admitting: Physician Assistant

## 2017-06-04 ENCOUNTER — Encounter: Payer: Self-pay | Admitting: Physician Assistant

## 2017-06-04 VITALS — BP 132/82 | HR 80 | Temp 98.5°F | Resp 17 | Ht 70.0 in | Wt 169.0 lb

## 2017-06-04 DIAGNOSIS — S50311A Abrasion of right elbow, initial encounter: Secondary | ICD-10-CM | POA: Diagnosis not present

## 2017-06-04 NOTE — Patient Instructions (Signed)
     IF you received an x-ray today, you will receive an invoice from [facility]. Please contact [facility] at [phone] with questions or concerns regarding your invoice.   IF you received labwork today, you will receive an invoice from [facility]. Please contact [facility] at [phone] with questions or concerns regarding your invoice.   Our billing staff will not be able to assist you with questions regarding bills from these companies.  You will be contacted with the lab results as soon as they are available. The fastest way to get your results is to activate your My Chart account. Instructions are located on the last page of this paperwork. If you have not heard from us regarding the results in 2 weeks, please contact this office.     

## 2017-06-04 NOTE — Progress Notes (Signed)
   06/04/2017 2:26 PM   DOB: 02-Mar-1933 / MRN: [id number]  SUBJECTIVE:  Mr. Heid is a well appearing 83 y.o. here today for wound care. He denies exquisite tenderness at the site of the wound, nausea, emesis, fever and chills.  He has been compliant with medical therapy and recommendations thus far.   He is allergic to allopurinol and ampicillin.    Review of Systems  Constitutional: Negative for chills, diaphoresis and fever.  Gastrointestinal: Negative for nausea.  Skin: Negative for rash.  Neurological: Negative for dizziness.    Problem list and medications reviewed and updated by myself where necessary, and exist elsewhere in the encounter.   OBJECTIVE:  BP 132/82   Pulse 80   Temp 98.5 F (36.9 C) (Oral)   Resp 17   Ht 5\' 10"  (1.778 m)   Wt 169 lb (76.7 kg)   SpO2 98%   BMI 24.25 kg/m  CrCl cannot be calculated (Patient's most recent lab result is older than the maximum 21 days allowed.).  Physical Exam  Constitutional: He is active and cooperative.  Cardiovascular: Normal rate.   Pulmonary/Chest: Effort normal. No tachypnea.  Neurological: He is alert.  Skin: Skin is warm.     Vitals reviewed.   No results found for this or any previous visit (from the past 48 hour(s)).  ASSESSMENT AND PLAN  Jami was seen today for follow-up.  Diagnoses and all orders for this visit:  Abrasion of right elbow, initial encounter: Wounds healing without complication. RTC on Friday for nurse visit only.     The patient was advised to call or return to clinic if he does not see an improvement in symptoms or to seek the care of the closest emergency department if he worsens with the above plan.   Philis Fendt, MHS, PA-C [facility] at [facility] 06/04/2017 2:26 PM

## 2017-06-06 ENCOUNTER — Encounter: Payer: Self-pay | Admitting: Physician Assistant

## 2017-06-06 ENCOUNTER — Ambulatory Visit (INDEPENDENT_AMBULATORY_CARE_PROVIDER_SITE_OTHER): Payer: Medicare Other | Admitting: Physician Assistant

## 2017-06-06 VITALS — BP 140/60 | HR 80 | Temp 98.5°F | Resp 18 | Ht 70.0 in | Wt 168.8 lb

## 2017-06-06 DIAGNOSIS — S50319A Abrasion of unspecified elbow, initial encounter: Secondary | ICD-10-CM | POA: Diagnosis not present

## 2017-06-06 NOTE — Progress Notes (Signed)
   06/06/2017 2:01 PM   DOB: 03/30/1933 / MRN: [id number]  SUBJECTIVE:  Mr. Ruppe is a well appearing 83 y.o. here today for wound care. He denies exquisite tenderness at the site of the wound, nausea, emesis, fever and chills.  He has been compliant with medical therapy and recommendations thus far.   He is allergic to allopurinol and ampicillin.    Review of Systems  Constitutional: Negative for chills, diaphoresis and fever.  Gastrointestinal: Negative for nausea.  Skin: Negative for rash.  Neurological: Negative for dizziness.    Problem list and medications reviewed and updated by myself where necessary, and exist elsewhere in the encounter.   OBJECTIVE:  BP 140/60 (BP Location: Right Arm, Patient Position: Sitting, Cuff Size: Normal)   Pulse 80   Temp 98.5 F (36.9 C) (Oral)   Resp 18   Ht 5\' 10"  (1.778 m)   Wt 168 lb 12.8 oz (76.6 kg)   SpO2 96%   BMI 24.22 kg/m  CrCl cannot be calculated (Patient's most recent lab result is older than the maximum 21 days allowed.).  Physical Exam  Constitutional: He is active and cooperative.  Cardiovascular: Normal rate.   Pulmonary/Chest: Effort normal. No tachypnea.  Neurological: He is alert.  Skin: Skin is warm. Rash (multiple healing abrasions on the left elbow) noted.  Vitals reviewed.   No results found for this or any previous visit (from the past 48 hour(s)).  ASSESSMENT AND PLAN  Kevan was seen today for follow-up.  Diagnoses and all orders for this visit:  Abrasion, elbow w/o infection Comments: Mostly resolved.  There is one area that is still forming granuation tissue.  Advised that he can leave this covered with a bandaid and to rtc as needed.     The patient was advised to call or return to clinic if he does not see an improvement in symptoms or to seek the care of the closest emergency department if he worsens with the above plan.   Philis Fendt, MHS, PA-C [facility] 06/06/2017 2:01 PM

## 2017-07-10 DIAGNOSIS — R809 Proteinuria, unspecified: Secondary | ICD-10-CM | POA: Diagnosis not present

## 2017-07-10 DIAGNOSIS — I129 Hypertensive chronic kidney disease with stage 1 through stage 4 chronic kidney disease, or unspecified chronic kidney disease: Secondary | ICD-10-CM | POA: Diagnosis not present

## 2017-07-10 DIAGNOSIS — N182 Chronic kidney disease, stage 2 (mild): Secondary | ICD-10-CM | POA: Diagnosis not present

## 2017-07-15 DIAGNOSIS — R809 Proteinuria, unspecified: Secondary | ICD-10-CM | POA: Diagnosis not present

## 2017-07-28 DIAGNOSIS — Z8546 Personal history of malignant neoplasm of prostate: Secondary | ICD-10-CM | POA: Diagnosis not present

## 2017-07-28 DIAGNOSIS — N35010 Post-traumatic urethral stricture, male, meatal: Secondary | ICD-10-CM | POA: Diagnosis not present

## 2017-07-28 DIAGNOSIS — R310 Gross hematuria: Secondary | ICD-10-CM | POA: Diagnosis not present

## 2017-09-04 ENCOUNTER — Ambulatory Visit (INDEPENDENT_AMBULATORY_CARE_PROVIDER_SITE_OTHER): Payer: Medicare Other | Admitting: Gastroenterology

## 2017-09-04 ENCOUNTER — Encounter: Payer: Self-pay | Admitting: Gastroenterology

## 2017-09-04 VITALS — BP 110/50 | HR 68 | Ht 69.0 in | Wt 170.5 lb

## 2017-09-04 DIAGNOSIS — K625 Hemorrhage of anus and rectum: Secondary | ICD-10-CM | POA: Diagnosis not present

## 2017-09-04 DIAGNOSIS — R110 Nausea: Secondary | ICD-10-CM | POA: Diagnosis not present

## 2017-09-04 MED ORDER — OMEPRAZOLE 40 MG PO CPDR: 40.0000 mg | DELAYED_RELEASE_CAPSULE | Freq: Every day | ORAL | 3 refills | Status: AC

## 2017-09-04 MED ORDER — PEG-KCL-NACL-NASULF-NA ASC-C 140 G PO SOLR: 1.0000 | ORAL | 0 refills | Status: DC

## 2017-09-04 NOTE — Progress Notes (Signed)
Assessment and plans reviewed  

## 2017-09-04 NOTE — Patient Instructions (Addendum)
If you are age 82 or older, your body mass index should be between 23-30. Your Body mass index is 25.18 kg/m. If this is out of the aforementioned range listed, please consider follow up with your Primary Care Provider.  If you are age 66 or younger, your body mass index should be between 19-25. Your Body mass index is 25.18 kg/m. If this is out of the aformentioned range listed, please consider follow up with your Primary Care Provider.   You have been scheduled for a colonoscopy. Please follow written instructions given to you at your visit today.  Please pick up your prep supplies at the pharmacy within the next 1-3 days. If you use inhalers (even only as needed), please bring them with you on the day of your procedure. Your physician has requested that you go to www.startemmi.com and enter the access code given to you at your visit today. This web site gives a general overview about your procedure. However, you should still follow specific instructions given to you by our office regarding your preparation for the procedure.  We have sent the following medications to your pharmacy for you to pick up at your convenience:  Plenvu    Thank you for choosing me and [facility].   Alonza Bogus, PA

## 2017-09-04 NOTE — Progress Notes (Signed)
09/04/2017 Lance Martin [id number] [birth year]   HISTORY OF PRESENT ILLNESS: This is a pleasant 84 year old male who is known to Dr. Henrene Pastor.  He is here today with complaints of rectal bleeding.  He tells me that last last Sunday or Monday he started having rectal bleeding.  He describes this as continuing for 5 or 6 days.  He says that it only occurred when he would have a bowel movement, but sometimes was large amounts.  He says that it was a darker red color, not small amounts of bright red blood that he had been used to seeing with hemorrhoids in the past.  Says he is never had bleeding like this before.  The bleeding did resolve about 5 or 6 days ago and he has not seen any further blood since that time.  He denies any abdominal pain.  He says that for the last few years every time he urinates he has to pass some stool, but denies any significant diarrhea or constipation.  He does report some recurrence of nausea.  He complained about this back in 2017 and underwent an EGD where he was found to have esophagitis.  Was on omeprazole 40 mg daily, but at his last visit with Dr. Henrene Pastor in October 2017 he was told that he could try to discontinue it and see how he did.  He was wondering if he should restart it.  Last colonoscopy was in August 2014 at which time he was found to have internal hemorrhoids and one polyp that was removed and was a tubular adenoma.   Past Medical History:  Diagnosis Date  . Arthritis   . AV block 05/13 ecg   1st degree  . Bleeding    bladder,radiation cystitis-s/p cystoscopy, clot evacuation, fulgeration of bleeders  . Cancer ([place])    prostate, hx of bsal cell skin cancer   . Cataract   . Gout   . Hematuria   . Hemorrhoid   . History of basal cell carcinoma excision 08-29-2011  left frontal scalp  . History of gout PER PT -  STABLE  . History of prostate cancer 2004   -  RX EXTERNAL RADIATION  . Hx of subdural hematoma 08-14-2011    s/p total left hip   12-92012---  resolved w/ no residual  . Hyperlipidemia   . Hypertension   . IFG (impaired fasting glucose)   . Lab findings of hypoxemia    abnormal ONO , dr perini. 05-26-2012  . Microalbuminuria 2011  . OA (osteoarthritis) of hip   . Obstructive apnea    AHI of 35 on 01-14-13 , titrated to 6 cm H20  . Pneumonia    hx of 2015   . Shoulder impingement syndrome    right  . Sleep apnea    cpap nightly  . Sleep apnea with use of continuous positive airway pressure (CPAP) 03/19/2013   Past Surgical History:  Procedure Laterality Date  . CARDIAC CATHETERIZATION     pt denies  . COLONOSCOPY    . CYSTOSCOPY WITH BIOPSY  07/03/2012   Procedure: CYSTOSCOPY WITH BIOPSY;  Surgeon: Franchot Gallo, MD;  Location[facility];  Service: Urology;  Laterality: N/A;      . CYSTOSCOPY WITH FULGERATION N/A 12/02/2016   Procedure: CYSTOSCOPY WITH FULGERATION OF BLEEDERS AND TRANSURETRAL INCISION OF PROSTATE;  Surgeon: Franchot Gallo, MD;  Location: [facility];  Service: Urology;  Laterality: N/A;  . EYE SURGERY  2009   bilateral  cataract surgery   . JOINT REPLACEMENT    . MOHS SURGERY    . PROSTATE SURGERY    . RIGHT ELBOW SURGERY  MAR 2013  . right elbow surgery  2009   ELBOW AND WRIST SURGERY  . TONSILLECTOMY     AGE 49  . TOTAL HIP ARTHROPLASTY  07/31/2011   Procedure: TOTAL HIP ARTHROPLASTY;  Surgeon: Dione Plover Aluisio;  Location: [facility];  Service: Orthopedics;  Laterality: Left;  . TOTAL HIP ARTHROPLASTY    . TRANSURETHRAL RESECTION OF PROSTATE  07/25/2012   Procedure: TRANSURETHRAL RESECTION OF THE PROSTATE WITH GYRUS INSTRUMENTS;  Surgeon: Alexis Frock, MD;  Location: [facility];  Service: Urology;  Laterality: N/A;  CYSTOSCOPY;CLOT EVACUATION WITH FULGERATION  . ULNAR NERVE REPAIR  04/13    reports that  has never smoked. he has never used smokeless tobacco. He reports that he drinks about 15.0 oz of alcohol per week. He reports that he does not use drugs. family history is  not on file. Allergies  Allergen Reactions  . Allopurinol Hives and Other (See Comments)    Cause renal failure   . Ampicillin Diarrhea and Nausea And Vomiting      Outpatient Encounter Medications as of 09/04/2017  Medication Sig  . amLODipine (NORVASC) 2.5 MG tablet Take 2.5 mg by mouth every evening.   Marland Kitchen atorvastatin (LIPITOR) 20 MG tablet Take 20 mg by mouth at bedtime.   . benazepril (LOTENSIN) 40 MG tablet Take 40 mg by mouth at bedtime.   . Coenzyme Q10 (COQ-10) 100 MG CAPS Take 1 Can by mouth daily.  . Cyanocobalamin (B-12) 2500 MCG SUBL Place 2,500 mcg under the tongue daily.  Marland Kitchen ezetimibe (ZETIA) 10 MG tablet Take 5 mg by mouth daily.  . febuxostat (ULORIC) 40 MG tablet Take 40 mg by mouth daily.  . Flaxseed, Linseed, (FLAXSEED OIL PO) Take 1,400 mg by mouth daily. Will stop prior to procedure  . fluticasone (FLONASE) 50 MCG/ACT nasal spray Place 2 sprays into the nose daily. ALLERGIES  . furosemide (LASIX) 20 MG tablet Take 20 mg by mouth daily. IN THE AM  . metoprolol succinate (TOPROL-XL) 25 MG 24 hr tablet Take 25 mg by mouth every evening.  . Multiple Vitamins-Minerals (MULTIVITAMINS THER. W/MINERALS) TABS Take 1 tablet by mouth daily.   . potassium chloride (KLOR-CON) 10 MEQ CR tablet Take 10 mEq by mouth daily.   . TURMERIC PO Take 1,000 mg by mouth 2 (two) times daily.  Marland Kitchen VASCEPA 1 g CAPS Take 1 g by mouth 2 (two) times daily. Will stop prior to procedure  . [DISCONTINUED] cephALEXin (KEFLEX) 250 MG capsule Take 1 capsule (250 mg total) by mouth 4 (four) times daily.  . [DISCONTINUED] HYDROcodone-acetaminophen (NORCO/VICODIN) 5-325 MG tablet Take 1 tablet by mouth every 6 (six) hours as needed for moderate pain.  . [DISCONTINUED] levocetirizine (XYZAL) 5 MG tablet Take 5 mg by mouth at bedtime.   . [DISCONTINUED] SULFINPYRAZONE PO Take 200 mg by mouth daily.   No facility-administered encounter medications on file as of 09/04/2017.      REVIEW OF SYSTEMS  : All other  systems reviewed and negative except where noted in the History of Present Illness.   PHYSICAL EXAM: BP (!) 110/50 (BP Location: Left Arm, Patient Position: Sitting, Cuff Size: Normal)   Pulse 68   Ht 5\' 9"  (1.753 m)   Wt 170 lb 8 oz (77.3 kg)   BMI 25.18 kg/m   General: Well developed white male  in no acute distress Head: Normocephalic and atraumatic Eyes:  Sclerae anicteric, conjunctiva pink. Ears: Normal auditory acuity Lungs: Clear throughout to auscultation; no increased WOB. Heart: Regular rate and rhythm; no M/R/G. Abdomen: Soft, non-distended.  BS present.  Non-tender. Rectal:  Will be done at the time of colonoscopy. Musculoskeletal: Symmetrical with no gross deformities  Skin: No lesions on visible extremities Extremities: No edema  Neurological: Alert oriented x 4, grossly non-focal Psychological:  Alert and cooperative. Normal mood and affect  ASSESSMENT AND PLAN: *LGIB:  Now resolved.  Has a history of internal hemorrhoids, but the bleeding that he describes seems more that what we would necessarily expect from hemorrhoidal bleeding.  Last colonoscopy 4.5 years ago.  Will schedule for colonoscopy with Dr. Henrene Pastor. *Nausea, recurrent:  Has history of esophagitis.  Has been off of PPI since 05/2016.  Will restart omeprazole 40 mg daily.  **The risks, benefits, and alternatives to colonoscopy were discussed with the patient and he consents to proceed.   CC:  Crist Infante, MD

## 2017-10-09 ENCOUNTER — Other Ambulatory Visit: Payer: Self-pay

## 2017-10-09 ENCOUNTER — Encounter: Payer: Self-pay | Admitting: Internal Medicine

## 2017-10-09 ENCOUNTER — Ambulatory Visit (AMBULATORY_SURGERY_CENTER): Payer: Medicare Other | Admitting: Internal Medicine

## 2017-10-09 VITALS — BP 137/65 | HR 86 | Temp 97.5°F | Resp 20 | Ht 69.0 in | Wt 170.0 lb

## 2017-10-09 DIAGNOSIS — D128 Benign neoplasm of rectum: Secondary | ICD-10-CM

## 2017-10-09 DIAGNOSIS — D124 Benign neoplasm of descending colon: Secondary | ICD-10-CM

## 2017-10-09 DIAGNOSIS — K625 Hemorrhage of anus and rectum: Secondary | ICD-10-CM | POA: Diagnosis not present

## 2017-10-09 DIAGNOSIS — R110 Nausea: Secondary | ICD-10-CM

## 2017-10-09 DIAGNOSIS — K921 Melena: Secondary | ICD-10-CM | POA: Diagnosis not present

## 2017-10-09 DIAGNOSIS — K621 Rectal polyp: Secondary | ICD-10-CM | POA: Diagnosis not present

## 2017-10-09 MED ORDER — SODIUM CHLORIDE 0.9 % IV SOLN: 500.0000 mL | Freq: Once | INTRAVENOUS | Status: DC

## 2017-10-09 NOTE — Progress Notes (Signed)
A and O x3. Report to RN. Tolerated MAC anesthesia well.

## 2017-10-09 NOTE — Progress Notes (Signed)
Pt's states no medical or surgical changes since previsit or office visit. 

## 2017-10-09 NOTE — Progress Notes (Signed)
Called to room to assist during endoscopic procedure.  Patient ID and intended procedure confirmed with present staff. Received instructions for my participation in the procedure from the performing physician.  

## 2017-10-09 NOTE — Op Note (Signed)
[facility] Patient Name: Lance Martin Procedure Date: 10/09/2017 11:15 AM MRN: [id number] Endoscopist: Docia Chuck. Henrene Pastor , MD Age: 84 Referring MD:  Date of Birth: 1933-04-19 Gender: Male Account #: [id number] Procedure:                Colonoscopy,with cold snare polypectomy x 2 Indications:              Rectal bleeding. Personal history of adenomatous                            colon polyps.The previous colonoscopies performed                            2003, 2009, 2014 Medicines:                Monitored Anesthesia Care Procedure:                Pre-Anesthesia Assessment:                           - Prior to the procedure, a History and Physical                            was performed, and patient medications and                            allergies were reviewed. The patient's tolerance of                            previous anesthesia was also reviewed. The risks                            and benefits of the procedure and the sedation                            options and risks were discussed with the patient.                            All questions were answered, and informed consent                            was obtained. Prior Anticoagulants: The patient has                            taken no previous anticoagulant or antiplatelet                            agents. ASA Grade Assessment: II - A patient with                            mild systemic disease. After reviewing the risks                            and benefits, the patient was deemed in  satisfactory condition to undergo the procedure.                           After obtaining informed consent, the colonoscope                            was passed under direct vision. Throughout the                            procedure, the patient's blood pressure, pulse, and                            oxygen saturations were monitored continuously. The                            Colonoscope was  introduced through the anus and                            advanced to the the cecum, identified by                            appendiceal orifice and ileocecal valve. The                            ileocecal valve, appendiceal orifice, and rectum                            were photographed. The quality of the bowel                            preparation was excellent. The colonoscopy was                            performed without difficulty. The patient tolerated                            the procedure well. The bowel preparation used was                            SUPREP. Scope In: 11:29:48 AM Scope Out: 11:42:05 AM Scope Withdrawal Time: 0 hours 9 minutes 35 seconds  Total Procedure Duration: 0 hours 12 minutes 17 seconds  Findings:                 Two polyps were found in the rectum and descending                            colon. The polyps were 1 to 2 mm in size. These                            polyps were removed with a cold snare. Resection                            and retrieval were complete.  Internal hemorrhoids were found during retroflexion.                           The exam was otherwise without abnormality on                            direct and retroflexion views. Complications:            No immediate complications. Estimated blood loss:                            None. Estimated Blood Loss:     Estimated blood loss: none. Impression:               - Two 1 to 2 mm polyps in the rectum and in the                            descending colon, removed with a cold snare.                            Resected and retrieved.                           - Internal hemorrhoids.                           - The examination was otherwise normal on direct                            and retroflexion views. Recommendation:           - Repeat colonoscopy is not recommended for                            surveillance.                           - Patient  has a contact number available for                            emergencies. The signs and symptoms of potential                            delayed complications were discussed with the                            patient. Return to normal activities tomorrow.                            Written discharge instructions were provided to the                            patient.                           - Resume previous diet.                           -  Continue present medications.                           - Await pathology results. Docia Chuck. Henrene Pastor, MD 10/09/2017 11:48:51 AM This report has been signed electronically.

## 2017-10-09 NOTE — Patient Instructions (Signed)
Handout given on polyps  YOU HAD AN ENDOSCOPIC PROCEDURE TODAY AT THE [facility]:   Refer to the procedure report that was given to you for any specific questions about what was found during the examination.  If the procedure report does not answer your questions, please call your gastroenterologist to clarify.  If you requested that your care partner not be given the details of your procedure findings, then the procedure report has been included in a sealed envelope for you to review at your convenience later.  YOU SHOULD EXPECT: Some feelings of bloating in the abdomen. Passage of more gas than usual.  Walking can help get rid of the air that was put into your GI tract during the procedure and reduce the bloating. If you had a lower endoscopy (such as a colonoscopy or flexible sigmoidoscopy) you may notice spotting of blood in your stool or on the toilet paper. If you underwent a bowel prep for your procedure, you may not have a normal bowel movement for a few days.  Please Note:  You might notice some irritation and congestion in your nose or some drainage.  This is from the oxygen used during your procedure.  There is no need for concern and it should clear up in a day or so.  SYMPTOMS TO REPORT IMMEDIATELY:   Following lower endoscopy (colonoscopy or flexible sigmoidoscopy):  Excessive amounts of blood in the stool  Significant tenderness or worsening of abdominal pains  Swelling of the abdomen that is new, acute  Fever of 100F or higher    For urgent or emergent issues, a gastroenterologist can be reached at any hour by calling [phone].   DIET:  We do recommend a small meal at first, but then you may proceed to your regular diet.  Drink plenty of fluids but you should avoid alcoholic beverages for 24 hours.  ACTIVITY:  You should plan to take it easy for the rest of today and you should NOT DRIVE or use heavy machinery until tomorrow (because of the sedation  medicines used during the test).    FOLLOW UP: Our staff will call the number listed on your records the next business day following your procedure to check on you and address any questions or concerns that you may have regarding the information given to you following your procedure. If we do not reach you, we will leave a message.  However, if you are feeling well and you are not experiencing any problems, there is no need to return our call.  We will assume that you have returned to your regular daily activities without incident.  If any biopsies were taken you will be contacted by phone or by letter within the next 1-3 weeks.  Please call us at [phone] if you have not heard about the biopsies in 3 weeks.    SIGNATURES/CONFIDENTIALITY: You and/or your care partner have signed paperwork which will be entered into your electronic medical record.  These signatures attest to the fact that that the information above on your After Visit Summary has been reviewed and is understood.  Full responsibility of the confidentiality of this discharge information lies with you and/or your care-partner. 

## 2017-10-10 ENCOUNTER — Telehealth: Payer: Self-pay | Admitting: *Deleted

## 2017-10-10 NOTE — Telephone Encounter (Signed)
  Follow up Call-  Call back number 10/09/2017 04/02/2016  Post procedure Call Back phone  # [phone]  Permission to leave phone message Yes Yes  Some recent data might be hidden     Patient questions:  Do you have a fever, pain , or abdominal swelling? No. Pain Score  0 *  Have you tolerated food without any problems? Yes.    Have you been able to return to your normal activities? Yes.    Do you have any questions about your discharge instructions: Diet   No. Medications  No. Follow up visit  No.  Do you have questions or concerns about your Care? No.  Actions: * If pain score is 4 or above: No action needed, pain <4.

## 2017-10-13 DIAGNOSIS — R7301 Impaired fasting glucose: Secondary | ICD-10-CM | POA: Diagnosis not present

## 2017-10-13 DIAGNOSIS — N183 Chronic kidney disease, stage 3 (moderate): Secondary | ICD-10-CM | POA: Diagnosis not present

## 2017-10-13 DIAGNOSIS — M545 Low back pain: Secondary | ICD-10-CM | POA: Diagnosis not present

## 2017-10-13 DIAGNOSIS — Z6824 Body mass index (BMI) 24.0-24.9, adult: Secondary | ICD-10-CM | POA: Diagnosis not present

## 2017-10-14 ENCOUNTER — Other Ambulatory Visit: Payer: Self-pay | Admitting: Internal Medicine

## 2017-10-14 DIAGNOSIS — M545 Low back pain, unspecified: Secondary | ICD-10-CM

## 2017-10-14 DIAGNOSIS — S32018A Other fracture of first lumbar vertebra, initial encounter for closed fracture: Secondary | ICD-10-CM

## 2017-10-15 ENCOUNTER — Ambulatory Visit
Admission: RE | Admit: 2017-10-15 | Discharge: 2017-10-15 | Disposition: A | Discharge status: Home / Self Care | Payer: Medicare Other | Source: Ambulatory Visit | Attending: Internal Medicine | Admitting: Internal Medicine

## 2017-10-15 DIAGNOSIS — M545 Low back pain, unspecified: Secondary | ICD-10-CM

## 2017-10-15 DIAGNOSIS — M48061 Spinal stenosis, lumbar region without neurogenic claudication: Secondary | ICD-10-CM | POA: Diagnosis not present

## 2017-10-15 DIAGNOSIS — S32018A Other fracture of first lumbar vertebra, initial encounter for closed fracture: Secondary | ICD-10-CM

## 2017-10-20 ENCOUNTER — Encounter: Payer: Self-pay | Admitting: Internal Medicine

## 2017-10-30 DIAGNOSIS — Z13820 Encounter for screening for osteoporosis: Secondary | ICD-10-CM | POA: Diagnosis not present

## 2017-12-16 ENCOUNTER — Other Ambulatory Visit: Payer: Self-pay | Admitting: Internal Medicine

## 2017-12-16 DIAGNOSIS — I10 Essential (primary) hypertension: Secondary | ICD-10-CM | POA: Diagnosis not present

## 2017-12-16 DIAGNOSIS — Z6824 Body mass index (BMI) 24.0-24.9, adult: Secondary | ICD-10-CM | POA: Diagnosis not present

## 2017-12-16 DIAGNOSIS — M545 Low back pain: Secondary | ICD-10-CM | POA: Diagnosis not present

## 2017-12-16 DIAGNOSIS — R2689 Other abnormalities of gait and mobility: Secondary | ICD-10-CM | POA: Diagnosis not present

## 2017-12-16 DIAGNOSIS — R7989 Other specified abnormal findings of blood chemistry: Secondary | ICD-10-CM

## 2017-12-16 DIAGNOSIS — S32019D Unspecified fracture of first lumbar vertebra, subsequent encounter for fracture with routine healing: Secondary | ICD-10-CM | POA: Diagnosis not present

## 2017-12-16 DIAGNOSIS — N183 Chronic kidney disease, stage 3 (moderate): Secondary | ICD-10-CM | POA: Diagnosis not present

## 2017-12-16 DIAGNOSIS — R110 Nausea: Secondary | ICD-10-CM

## 2017-12-16 DIAGNOSIS — E7849 Other hyperlipidemia: Secondary | ICD-10-CM | POA: Diagnosis not present

## 2017-12-16 DIAGNOSIS — R945 Abnormal results of liver function studies: Principal | ICD-10-CM

## 2017-12-17 ENCOUNTER — Emergency Department (HOSPITAL_COMMUNITY): Payer: Medicare Other

## 2017-12-17 ENCOUNTER — Encounter (HOSPITAL_COMMUNITY): Payer: Self-pay | Admitting: Emergency Medicine

## 2017-12-17 ENCOUNTER — Inpatient Hospital Stay (HOSPITAL_COMMUNITY)
Admission: EM | Admit: 2017-12-17 | Discharge: 2017-12-23 | DRG: 872 | Disposition: A | Discharge status: Skilled Nursing Facility | Payer: Medicare Other | Attending: Internal Medicine | Admitting: Internal Medicine

## 2017-12-17 ENCOUNTER — Other Ambulatory Visit: Payer: Medicare Other

## 2017-12-17 DIAGNOSIS — R2681 Unsteadiness on feet: Secondary | ICD-10-CM | POA: Diagnosis not present

## 2017-12-17 DIAGNOSIS — R0781 Pleurodynia: Secondary | ICD-10-CM

## 2017-12-17 DIAGNOSIS — R17 Unspecified jaundice: Secondary | ICD-10-CM | POA: Diagnosis not present

## 2017-12-17 DIAGNOSIS — L8990 Pressure ulcer of unspecified site, unspecified stage: Secondary | ICD-10-CM

## 2017-12-17 DIAGNOSIS — M2550 Pain in unspecified joint: Secondary | ICD-10-CM | POA: Diagnosis not present

## 2017-12-17 DIAGNOSIS — Z923 Personal history of irradiation: Secondary | ICD-10-CM

## 2017-12-17 DIAGNOSIS — Z01810 Encounter for preprocedural cardiovascular examination: Secondary | ICD-10-CM | POA: Diagnosis not present

## 2017-12-17 DIAGNOSIS — Z86010 Personal history of colonic polyps: Secondary | ICD-10-CM

## 2017-12-17 DIAGNOSIS — G4733 Obstructive sleep apnea (adult) (pediatric): Secondary | ICD-10-CM | POA: Diagnosis present

## 2017-12-17 DIAGNOSIS — R945 Abnormal results of liver function studies: Secondary | ICD-10-CM | POA: Diagnosis not present

## 2017-12-17 DIAGNOSIS — K7460 Unspecified cirrhosis of liver: Secondary | ICD-10-CM | POA: Diagnosis not present

## 2017-12-17 DIAGNOSIS — K0889 Other specified disorders of teeth and supporting structures: Secondary | ICD-10-CM | POA: Diagnosis present

## 2017-12-17 DIAGNOSIS — Z96642 Presence of left artificial hip joint: Secondary | ICD-10-CM | POA: Diagnosis present

## 2017-12-17 DIAGNOSIS — I4891 Unspecified atrial fibrillation: Secondary | ICD-10-CM | POA: Diagnosis not present

## 2017-12-17 DIAGNOSIS — Z9841 Cataract extraction status, right eye: Secondary | ICD-10-CM

## 2017-12-17 DIAGNOSIS — M48061 Spinal stenosis, lumbar region without neurogenic claudication: Secondary | ICD-10-CM | POA: Diagnosis present

## 2017-12-17 DIAGNOSIS — R6510 Systemic inflammatory response syndrome (SIRS) of non-infectious origin without acute organ dysfunction: Secondary | ICD-10-CM | POA: Diagnosis not present

## 2017-12-17 DIAGNOSIS — K810 Acute cholecystitis: Secondary | ICD-10-CM | POA: Diagnosis present

## 2017-12-17 DIAGNOSIS — R531 Weakness: Secondary | ICD-10-CM | POA: Diagnosis not present

## 2017-12-17 DIAGNOSIS — Z9079 Acquired absence of other genital organ(s): Secondary | ICD-10-CM

## 2017-12-17 DIAGNOSIS — I10 Essential (primary) hypertension: Secondary | ICD-10-CM | POA: Diagnosis not present

## 2017-12-17 DIAGNOSIS — I440 Atrioventricular block, first degree: Secondary | ICD-10-CM | POA: Diagnosis present

## 2017-12-17 DIAGNOSIS — Z9842 Cataract extraction status, left eye: Secondary | ICD-10-CM

## 2017-12-17 DIAGNOSIS — Y92230 Patient room in hospital as the place of occurrence of the external cause: Secondary | ICD-10-CM | POA: Diagnosis not present

## 2017-12-17 DIAGNOSIS — T45515A Adverse effect of anticoagulants, initial encounter: Secondary | ICD-10-CM | POA: Diagnosis not present

## 2017-12-17 DIAGNOSIS — K7031 Alcoholic cirrhosis of liver with ascites: Secondary | ICD-10-CM | POA: Diagnosis not present

## 2017-12-17 DIAGNOSIS — K7689 Other specified diseases of liver: Secondary | ICD-10-CM | POA: Diagnosis not present

## 2017-12-17 DIAGNOSIS — Z9989 Dependence on other enabling machines and devices: Secondary | ICD-10-CM

## 2017-12-17 DIAGNOSIS — D689 Coagulation defect, unspecified: Secondary | ICD-10-CM | POA: Diagnosis not present

## 2017-12-17 DIAGNOSIS — Z85828 Personal history of other malignant neoplasm of skin: Secondary | ICD-10-CM

## 2017-12-17 DIAGNOSIS — Z8679 Personal history of other diseases of the circulatory system: Secondary | ICD-10-CM

## 2017-12-17 DIAGNOSIS — R262 Difficulty in walking, not elsewhere classified: Secondary | ICD-10-CM | POA: Diagnosis not present

## 2017-12-17 DIAGNOSIS — G4730 Sleep apnea, unspecified: Secondary | ICD-10-CM | POA: Diagnosis present

## 2017-12-17 DIAGNOSIS — I700 Atherosclerosis of aorta: Secondary | ICD-10-CM | POA: Diagnosis present

## 2017-12-17 DIAGNOSIS — E785 Hyperlipidemia, unspecified: Secondary | ICD-10-CM | POA: Diagnosis present

## 2017-12-17 DIAGNOSIS — I471 Supraventricular tachycardia: Secondary | ICD-10-CM | POA: Diagnosis not present

## 2017-12-17 DIAGNOSIS — K5900 Constipation, unspecified: Secondary | ICD-10-CM | POA: Diagnosis present

## 2017-12-17 DIAGNOSIS — Z7401 Bed confinement status: Secondary | ICD-10-CM | POA: Diagnosis not present

## 2017-12-17 DIAGNOSIS — R5381 Other malaise: Secondary | ICD-10-CM | POA: Diagnosis present

## 2017-12-17 DIAGNOSIS — R933 Abnormal findings on diagnostic imaging of other parts of digestive tract: Secondary | ICD-10-CM | POA: Diagnosis not present

## 2017-12-17 DIAGNOSIS — N179 Acute kidney failure, unspecified: Secondary | ICD-10-CM | POA: Diagnosis not present

## 2017-12-17 DIAGNOSIS — R778 Other specified abnormalities of plasma proteins: Secondary | ICD-10-CM | POA: Diagnosis present

## 2017-12-17 DIAGNOSIS — R1011 Right upper quadrant pain: Secondary | ICD-10-CM | POA: Diagnosis not present

## 2017-12-17 DIAGNOSIS — K8000 Calculus of gallbladder with acute cholecystitis without obstruction: Secondary | ICD-10-CM | POA: Diagnosis not present

## 2017-12-17 DIAGNOSIS — E860 Dehydration: Secondary | ICD-10-CM | POA: Diagnosis present

## 2017-12-17 DIAGNOSIS — R630 Anorexia: Secondary | ICD-10-CM | POA: Diagnosis present

## 2017-12-17 DIAGNOSIS — D6832 Hemorrhagic disorder due to extrinsic circulating anticoagulants: Secondary | ICD-10-CM | POA: Diagnosis not present

## 2017-12-17 DIAGNOSIS — E876 Hypokalemia: Secondary | ICD-10-CM | POA: Diagnosis not present

## 2017-12-17 DIAGNOSIS — R1310 Dysphagia, unspecified: Secondary | ICD-10-CM | POA: Diagnosis present

## 2017-12-17 DIAGNOSIS — F1010 Alcohol abuse, uncomplicated: Secondary | ICD-10-CM | POA: Diagnosis not present

## 2017-12-17 DIAGNOSIS — R109 Unspecified abdominal pain: Secondary | ICD-10-CM | POA: Diagnosis not present

## 2017-12-17 DIAGNOSIS — K449 Diaphragmatic hernia without obstruction or gangrene: Secondary | ICD-10-CM | POA: Diagnosis present

## 2017-12-17 DIAGNOSIS — A419 Sepsis, unspecified organism: Principal | ICD-10-CM | POA: Diagnosis present

## 2017-12-17 DIAGNOSIS — I491 Atrial premature depolarization: Secondary | ICD-10-CM | POA: Diagnosis present

## 2017-12-17 DIAGNOSIS — Z8701 Personal history of pneumonia (recurrent): Secondary | ICD-10-CM

## 2017-12-17 DIAGNOSIS — R488 Other symbolic dysfunctions: Secondary | ICD-10-CM | POA: Diagnosis not present

## 2017-12-17 DIAGNOSIS — D6959 Other secondary thrombocytopenia: Secondary | ICD-10-CM | POA: Diagnosis not present

## 2017-12-17 DIAGNOSIS — M6281 Muscle weakness (generalized): Secondary | ICD-10-CM | POA: Diagnosis not present

## 2017-12-17 DIAGNOSIS — Z634 Disappearance and death of family member: Secondary | ICD-10-CM

## 2017-12-17 DIAGNOSIS — R1312 Dysphagia, oropharyngeal phase: Secondary | ICD-10-CM | POA: Diagnosis not present

## 2017-12-17 DIAGNOSIS — I493 Ventricular premature depolarization: Secondary | ICD-10-CM | POA: Diagnosis not present

## 2017-12-17 DIAGNOSIS — I498 Other specified cardiac arrhythmias: Secondary | ICD-10-CM | POA: Diagnosis not present

## 2017-12-17 DIAGNOSIS — Z87311 Personal history of (healed) other pathological fracture: Secondary | ICD-10-CM

## 2017-12-17 DIAGNOSIS — R05 Cough: Secondary | ICD-10-CM | POA: Diagnosis not present

## 2017-12-17 DIAGNOSIS — R7989 Other specified abnormal findings of blood chemistry: Secondary | ICD-10-CM

## 2017-12-17 DIAGNOSIS — Z6825 Body mass index (BMI) 25.0-25.9, adult: Secondary | ICD-10-CM

## 2017-12-17 DIAGNOSIS — K210 Gastro-esophageal reflux disease with esophagitis: Secondary | ICD-10-CM | POA: Diagnosis present

## 2017-12-17 DIAGNOSIS — K7030 Alcoholic cirrhosis of liver without ascites: Secondary | ICD-10-CM | POA: Diagnosis not present

## 2017-12-17 DIAGNOSIS — Z8546 Personal history of malignant neoplasm of prostate: Secondary | ICD-10-CM

## 2017-12-17 DIAGNOSIS — M5136 Other intervertebral disc degeneration, lumbar region: Secondary | ICD-10-CM | POA: Diagnosis present

## 2017-12-17 DIAGNOSIS — K8309 Other cholangitis: Secondary | ICD-10-CM | POA: Diagnosis present

## 2017-12-17 DIAGNOSIS — R079 Chest pain, unspecified: Secondary | ICD-10-CM | POA: Diagnosis not present

## 2017-12-17 DIAGNOSIS — K648 Other hemorrhoids: Secondary | ICD-10-CM | POA: Diagnosis present

## 2017-12-17 DIAGNOSIS — I4510 Unspecified right bundle-branch block: Secondary | ICD-10-CM | POA: Diagnosis present

## 2017-12-17 DIAGNOSIS — Z881 Allergy status to other antibiotic agents status: Secondary | ICD-10-CM

## 2017-12-17 DIAGNOSIS — Z880 Allergy status to penicillin: Secondary | ICD-10-CM

## 2017-12-17 DIAGNOSIS — I340 Nonrheumatic mitral (valve) insufficiency: Secondary | ICD-10-CM | POA: Diagnosis not present

## 2017-12-17 DIAGNOSIS — K219 Gastro-esophageal reflux disease without esophagitis: Secondary | ICD-10-CM | POA: Diagnosis not present

## 2017-12-17 DIAGNOSIS — D696 Thrombocytopenia, unspecified: Secondary | ICD-10-CM | POA: Diagnosis not present

## 2017-12-17 DIAGNOSIS — K766 Portal hypertension: Secondary | ICD-10-CM | POA: Diagnosis not present

## 2017-12-17 DIAGNOSIS — Z66 Do not resuscitate: Secondary | ICD-10-CM | POA: Diagnosis present

## 2017-12-17 DIAGNOSIS — Z9089 Acquired absence of other organs: Secondary | ICD-10-CM

## 2017-12-17 DIAGNOSIS — M109 Gout, unspecified: Secondary | ICD-10-CM | POA: Diagnosis not present

## 2017-12-17 DIAGNOSIS — R059 Cough, unspecified: Secondary | ICD-10-CM

## 2017-12-17 DIAGNOSIS — R9389 Abnormal findings on diagnostic imaging of other specified body structures: Secondary | ICD-10-CM | POA: Diagnosis not present

## 2017-12-17 DIAGNOSIS — Z79899 Other long term (current) drug therapy: Secondary | ICD-10-CM

## 2017-12-17 DIAGNOSIS — R748 Abnormal levels of other serum enzymes: Secondary | ICD-10-CM | POA: Diagnosis not present

## 2017-12-17 LAB — PROTIME-INR
INR: 1.45
PROTHROMBIN TIME: 17.5 s — AB (ref 11.4–15.2)

## 2017-12-17 LAB — CBC WITH DIFFERENTIAL/PLATELET
BASOS PCT: 0 %
Basophils Absolute: 0 10*3/uL (ref 0.0–0.1)
EOS ABS: 0 10*3/uL (ref 0.0–0.7)
EOS PCT: 0 %
HCT: 37.7 % — ABNORMAL LOW (ref 39.0–52.0)
Hemoglobin: 13.6 g/dL (ref 13.0–17.0)
Lymphocytes Relative: 11 %
Lymphs Abs: 2 10*3/uL (ref 0.7–4.0)
MCH: 35.3 pg — ABNORMAL HIGH (ref 26.0–34.0)
MCHC: 36.1 g/dL — AB (ref 30.0–36.0)
MCV: 97.9 fL (ref 78.0–100.0)
MONO ABS: 2.2 10*3/uL (ref 0.1–1.0)
MONOS PCT: 12 %
NEUTROS PCT: 77 %
Neutro Abs: 14 10*3/uL (ref 1.7–7.7)
PLATELETS: 176 10*3/uL (ref 150–400)
RBC: 3.85 MIL/uL — ABNORMAL LOW (ref 4.22–5.81)
RDW: 14.8 % (ref 11.5–15.5)
WBC: 18.2 10*3/uL — ABNORMAL HIGH (ref 4.0–10.5)

## 2017-12-17 LAB — COMPREHENSIVE METABOLIC PANEL
ALT: 64 U/L — ABNORMAL HIGH (ref 17–63)
ANION GAP: 14 (ref 5–15)
AST: 105 U/L — ABNORMAL HIGH (ref 15–41)
Albumin: 2.6 g/dL — ABNORMAL LOW (ref 3.5–5.0)
Alkaline Phosphatase: 196 U/L — ABNORMAL HIGH (ref 38–126)
BUN: 47 mg/dL — ABNORMAL HIGH (ref 6–20)
CHLORIDE: 98 mmol/L — AB (ref 101–111)
CO2: 22 mmol/L (ref 22–32)
Calcium: 8.4 mg/dL — ABNORMAL LOW (ref 8.9–10.3)
Creatinine, Ser: 1.98 mg/dL — ABNORMAL HIGH (ref 0.61–1.24)
GFR calc non Af Amer: 30 mL/min — ABNORMAL LOW (ref >60)
GFR, EST AFRICAN AMERICAN: 34 mL/min — AB (ref >60)
Glucose, Bld: 97 mg/dL (ref 65–99)
Potassium: 3.6 mmol/L (ref 3.5–5.1)
SODIUM: 134 mmol/L — AB (ref 135–145)
Total Bilirubin: 5.4 mg/dL — ABNORMAL HIGH (ref 0.3–1.2)
Total Protein: 5.7 g/dL — ABNORMAL LOW (ref 6.5–8.1)

## 2017-12-17 LAB — BRAIN NATRIURETIC PEPTIDE: B Natriuretic Peptide: 330.2 pg/mL — ABNORMAL HIGH (ref 0.0–100.0)

## 2017-12-17 LAB — LACTIC ACID, PLASMA: Lactic Acid, Venous: 1.8 mmol/L (ref 0.5–1.9)

## 2017-12-17 LAB — LIPASE, BLOOD: LIPASE: 121 U/L — AB (ref 11–51)

## 2017-12-17 LAB — APTT: aPTT: 38 seconds — ABNORMAL HIGH (ref 24–36)

## 2017-12-17 LAB — ETHANOL

## 2017-12-17 LAB — PROCALCITONIN: Procalcitonin: 2.24 ng/mL

## 2017-12-17 LAB — TROPONIN I: Troponin I: 0.1 ng/mL (ref <0.03)

## 2017-12-17 MED ORDER — METRONIDAZOLE IN NACL 5-0.79 MG/ML-% IV SOLN
500.0000 mg | Freq: Once | INTRAVENOUS | Status: AC
  Administered 2017-12-18: 500 mg via INTRAVENOUS
  Filled 2017-12-17: qty 100

## 2017-12-17 MED ORDER — ONDANSETRON HCL 4 MG/2ML IJ SOLN: 4.0000 mg | Freq: Four times a day (QID) | INTRAMUSCULAR | Status: DC | PRN

## 2017-12-17 MED ORDER — SODIUM CHLORIDE 0.9 % IV SOLN
INTRAVENOUS | Status: AC
  Administered 2017-12-18: via INTRAVENOUS

## 2017-12-17 MED ORDER — METRONIDAZOLE IN NACL 5-0.79 MG/ML-% IV SOLN
500.0000 mg | Freq: Three times a day (TID) | INTRAVENOUS | Status: DC
  Administered 2017-12-18 – 2017-12-21 (×11): 500 mg via INTRAVENOUS
  Filled 2017-12-17 (×12): qty 100

## 2017-12-17 MED ORDER — ONDANSETRON HCL 4 MG PO TABS: 4.0000 mg | ORAL_TABLET | Freq: Four times a day (QID) | ORAL | Status: DC | PRN

## 2017-12-17 MED ORDER — CIPROFLOXACIN IN D5W 400 MG/200ML IV SOLN: 400.0000 mg | Freq: Once | INTRAVENOUS | Status: DC

## 2017-12-17 MED ORDER — CEFEPIME HCL 2 G IJ SOLR
2.0000 g | Freq: Two times a day (BID) | INTRAMUSCULAR | Status: DC
Start: 2017-12-18 — End: 2017-12-20
  Administered 2017-12-18 – 2017-12-20 (×5): 2 g via INTRAVENOUS
  Filled 2017-12-17 (×6): qty 2

## 2017-12-17 MED ORDER — CEFEPIME HCL 2 G IJ SOLR
2.0000 g | Freq: Once | INTRAMUSCULAR | Status: AC
  Administered 2017-12-18: 2 g via INTRAVENOUS
  Filled 2017-12-17: qty 2

## 2017-12-17 NOTE — ED Notes (Signed)
Patient transported to MRI 

## 2017-12-17 NOTE — ED Notes (Signed)
Recollect light green and dark green tube.

## 2017-12-17 NOTE — ED Notes (Signed)
Pt has gone to MRI and can't get labs or give IV antibiotics at this time

## 2017-12-17 NOTE — H&P (Signed)
Lance Martin FIE:[id number] DOB: Aug 07, 1933 DOA: 12/17/2017     PCP: Lance Infante, MD   Outpatient Specialists:     Urology Lance Martin Martin Lance Martin Patient arrived to ER on 12/17/17 at 1500  Patient coming from:   home Lives  With family    Chief Complaint:  Chief Complaint  Patient presents with  . Atrial Fibrillation  . Weakness    HPI: Lance Martin is a 84 y.o. male with medical history significant of prostate cancer, gout, hematuria,hTN, HLD    Presented with nausea and abdominal discomfortand jaundice he was supposed to have ultrasound [place] for causes of liver failurebut was too weak to go Has been feeling weak for a past few days which right upper quadrant pain anorexia nausea patient states that he drinks alcohol He presented primary care office was found to have A. Fib with RVR heart rate 110  Family reports he has been drinking more than usual 2 shots a day since his wife passed away few months ago.    Regarding pertinent Chronic problems: HTN on metoprolol, recently stoped norvasc and benazapril   While in ER:   Following Medications were ordered in ER: Medications - No data to display  Significant initial  Findings: Abnormal Labs Reviewed  BRAIN NATRIURETIC PEPTIDE - Abnormal; Notable for the following components:      Result Value   B Natriuretic Peptide 330.2 (*)    All other components within normal limits  CBC WITH DIFFERENTIAL/PLATELET - Abnormal; Notable for the following components:   WBC 18.2 (*)    RBC 3.85 (*)    HCT 37.7 (*)    MCH 35.3 (*)    MCHC 36.1 (*)    All other components within normal limits  COMPREHENSIVE METABOLIC PANEL - Abnormal; Notable for the following components:   Sodium 134 (*)    Chloride 98 (*)    BUN 47 (*)    Creatinine, Ser 1.98 (*)    Calcium 8.4 (*)    Total Protein 5.7 (*)    Albumin 2.6 (*)    AST 105 (*)    ALT 64 (*)    Alkaline Phosphatase 196 (*)    Total Bilirubin 5.4 (*)    GFR calc  non Af Amer 30 (*)    GFR calc Af Amer 34 (*)    All other components within normal limits  LIPASE, BLOOD - Abnormal; Notable for the following components:   Lipase 121 (*)    All other components within normal limits     Na 134 K 3.6  Cr   Up from baseline see below Lab Results  Component Value Date   CREATININE 1.98 (H) 12/17/2017   CREATININE 1.10 11/29/2016   CREATININE 2.11 (H) 07/26/2012      WBC 18.2  HG/HCT stable,       Component Value Date/Time   HGB 13.6 12/17/2017 1521   HCT 37.7 (L) 12/17/2017 1521       Troponin (Point of Care Test) No results for input(s): TROPIPOC in the last 72 hours.    BNP (last 3 results) Recent Labs    12/17/17 1545  BNP 330.2*    ProBNP (last 3 results) No results for input(s): PROBNP in the last 8760 hours.  Lactic Acid, Venous No results found for: LATICACIDVEN    UA  not ordered      CXR - mild interstitial edema [place]  - thick-walled gallbladder sludge  ECG:  Personally reviewed by me showing: HR : 112 Rhythm:   A.fib w RVR.    no evidence of ischemic changes QTC 485     ED Triage Vitals  Enc Vitals Group     BP 12/17/17 1812 114/70     Pulse Rate 12/17/17 1515 (!) 105     Resp 12/17/17 1515 17     Temp 12/17/17 1812 99.8 F (37.7 C)     Temp Source 12/17/17 1812 Oral     SpO2 12/17/17 1515 98 %     Weight --      Height --      Head Circumference --      Peak Flow --      Pain Score 12/17/17 1523 0     Pain Loc --      Pain Edu? --      Excl. in [place]? --   TMAX(24)@       Latest  Blood pressure 137/77, pulse (!) 110, temperature 99.8 F (37.7 C), temperature source Oral, resp. rate 20, SpO2 93 %.    ER Provider Called:  Lance Martin   Lance Martin They Recommend Admit to medicine MRCP , start IV zosyn Will see in AM   Hospitalist was called for admission for acute elevations of LFTs   Review of Systems:    Pertinent positives include: fatigue, jaundice abdominal pain, nausea, loss of  appetite, Constitutional:  No weight loss, night sweats, Fevers, chills, weight loss  HEENT:  No headaches, Difficulty swallowing,Tooth/dental problems,Sore throat,  No sneezing, itching, ear ache, nasal congestion, post nasal drip,  Cardio-vascular:  No chest pain, Orthopnea, PND, anasarca, dizziness, palpitations.no Bilateral lower extremity swelling  Martin:  No heartburn, indigestion,  vomiting, diarrhea, change in bowel habits,  melena, blood in stool, hematemesis Resp:  no shortness of breath at rest. No dyspnea on exertion, No excess mucus, no productive cough, No non-productive cough, No coughing up of blood.No change in color of mucus.No wheezing. Skin:  no rash or lesions. No jaundice GU:  no dysuria, change in color of urine, no urgency or frequency. No straining to urinate.  No flank pain.  Musculoskeletal:  No joint pain or no joint swelling. No decreased range of motion. No back pain.  Psych:  No change in mood or affect. No depression or anxiety. No memory loss.  Neuro: no localizing neurological complaints, no tingling, no weakness, no double vision, no gait abnormality, no slurred speech, no confusion  As per HPI otherwise 10 point review of systems negative.   Past Medical History:   Past Medical History:  Diagnosis Date  . Arthritis   . AV block 05/13 ecg   1st degree  . Bleeding    bladder,radiation cystitis-s/p cystoscopy, clot evacuation, fulgeration of bleeders  . Cancer ([place])    prostate, hx of bsal cell skin cancer   . Cataract   . Gout   . Hematuria   . Hemorrhoid   . History of basal cell carcinoma excision 08-29-2011  left frontal scalp  . History of gout PER PT -  STABLE  . History of prostate cancer 2004   -  RX EXTERNAL RADIATION  . Hx of subdural hematoma 08-14-2011    s/p total left hip  12-92012---  resolved w/ no residual  . Hyperlipidemia   . Hypertension   . IFG (impaired fasting glucose)   . Lab findings of hypoxemia    abnormal ONO ,  dr perini. 05-26-2012  . Microalbuminuria 2011  .  OA (osteoarthritis) of hip   . Obstructive apnea    AHI of 35 on 01-14-13 , titrated to 6 cm H20  . Pneumonia    hx of 2015   . Shoulder impingement syndrome    right  . Sleep apnea    cpap nightly  . Sleep apnea with use of continuous positive airway pressure (CPAP) 03/19/2013      Past Surgical History:  Procedure Laterality Date  . CARDIAC CATHETERIZATION     pt denies  . COLONOSCOPY    . CYSTOSCOPY WITH BIOPSY  07/03/2012   Procedure: CYSTOSCOPY WITH BIOPSY;  Surgeon: Franchot Gallo, MD;  Location: [facility];  Service: Urology;  Laterality: N/A;      . CYSTOSCOPY WITH FULGERATION N/A 12/02/2016   Procedure: CYSTOSCOPY WITH FULGERATION OF BLEEDERS AND TRANSURETRAL INCISION OF PROSTATE;  Surgeon: Franchot Gallo, MD;  Location: [facility];  Service: Urology;  Laterality: N/A;  . EYE SURGERY  2009   bilateral cataract surgery   . JOINT REPLACEMENT    . MOHS SURGERY    . PROSTATE SURGERY    . RIGHT ELBOW SURGERY  MAR 2013  . right elbow surgery  2009   ELBOW AND WRIST SURGERY  . TONSILLECTOMY     AGE 28  . TOTAL HIP ARTHROPLASTY  07/31/2011   Procedure: TOTAL HIP ARTHROPLASTY;  Surgeon: Dione Plover Aluisio;  Location: [facility];  Service: Orthopedics;  Laterality: Left;  . TOTAL HIP ARTHROPLASTY    . TRANSURETHRAL RESECTION OF PROSTATE  07/25/2012   Procedure: TRANSURETHRAL RESECTION OF THE PROSTATE WITH GYRUS INSTRUMENTS;  Surgeon: Alexis Frock, MD;  Location: [facility];  Service: Urology;  Laterality: N/A;  CYSTOSCOPY;CLOT EVACUATION WITH FULGERATION  . ULNAR NERVE REPAIR  04/13    Social History:  Ambulatory  independently  Or cane, walker  wheelchair bound, bed bound     reports that he has never smoked. He has never used smokeless tobacco. He reports that he drinks about 15.0 oz of alcohol per week. He reports that he does not use drugs.     Family History:   Family History  Problem Relation Age of  Onset  . Colon cancer Neg Hx   . Stomach cancer Neg Hx   . Esophageal cancer Neg Hx     Allergies: Allergies  Allergen Reactions  . Allopurinol Hives and Other (See Comments)    Cause renal failure   . Ampicillin Diarrhea and Nausea And Vomiting     Prior to Admission medications   Medication Sig Start Date End Date Taking? Authorizing Provider  amLODipine (NORVASC) 2.5 MG tablet Take 2.5 mg by mouth every evening.  11/25/16  Yes [provider]  atorvastatin (LIPITOR) 40 MG tablet Take 40 mg by mouth daily.   Yes [provider]  benazepril (LOTENSIN) 40 MG tablet Take 40 mg by mouth at bedtime.    Yes [provider]  calcium-vitamin D (OSCAL WITH D) 500-200 MG-UNIT tablet Take 1 tablet by mouth.   Yes [provider]  cetirizine (ZYRTEC) 10 MG tablet Take 10 mg by mouth daily.   Yes [provider]  cholecalciferol (VITAMIN D) 1000 units tablet Take 1,000 Units by mouth daily.   Yes [provider]  Coenzyme Q10 (COQ-10) 100 MG CAPS Take 1 Can by mouth as needed.    Yes [provider]  Flaxseed, Linseed, (FLAXSEED OIL PO) Take 1,400 mg by mouth daily. Will stop prior to procedure   Yes [provider]  fluticasone (FLONASE) 50 MCG/ACT nasal spray Place 2 sprays into the nose daily. ALLERGIES   Yes [provider]  metoprolol succinate (TOPROL-XL) 25 MG 24 hr tablet Take 25 mg by mouth daily.   Yes [provider]  Multiple Vitamins-Minerals (MULTIVITAMINS THER. W/MINERALS) TABS Take 1 tablet by mouth daily.    Yes [provider]  TURMERIC PO Take 1,000 mg by mouth 2 (two) times daily.   Yes [provider]  Febuxostat 80 MG TABS Take 80 mg by mouth daily.     [provider]  omeprazole (PRILOSEC) 40 MG capsule Take 1 capsule (40 mg total) by mouth daily. 09/04/17   Zehr, Laban Emperor, PA-C   Physical Exam: Blood pressure 137/77, pulse (!) 110, temperature 99.8 F  (37.7 C), temperature source Oral, resp. rate 20, SpO2 93 %. 1. General:  in No Acute distress  Chronically ill  -appearing 2. Psychological: Alert and   Oriented 3. Head/ENT:   Dry Mucous Membranes                          Head Non traumatic, neck supple                           Poor Dentition 4. SKIN:   decreased Skin turgor,  Skin clean Dry and intact no rash, jaundiced 5. Heart: Regular rate and rhythm no Murmur, no Rub or gallop 6. Lungs:   no wheezes or crackles   7. Abdomen: Soft,  RUQ tenderness, Non distended  bowel sounds present 8. Lower extremities: no clubbing, cyanosis, or edema 9. Neurologically Grossly intact, moving all 4 extremities equally  10. MSK: Normal range of motion   LABS:     Recent Labs  Lab 12/17/17 1521  WBC 18.2*  NEUTROABS 14.0  HGB 13.6  HCT 37.7*  MCV 97.9  PLT 585   Basic Metabolic Panel: Recent Labs  Lab 12/17/17 1823  NA 134*  K 3.6  CL 98*  CO2 22  GLUCOSE 97  BUN 47*  CREATININE 1.98*  CALCIUM 8.4*      Recent Labs  Lab 12/17/17 1823  AST 105*  ALT 64*  ALKPHOS 196*  BILITOT 5.4*  PROT 5.7*  ALBUMIN 2.6*   Recent Labs  Lab 12/17/17 1823  LIPASE 121*   No results for input(s): AMMONIA in the last 168 hours.    HbA1C: No results for input(s): HGBA1C in the last 72 hours. CBG: No results for input(s): GLUCAP in the last 168 hours.    Urine analysis:    Component Value Date/Time   COLORURINE YELLOW 08/14/2011 2130   APPEARANCEUR CLEAR 08/14/2011 2130   LABSPEC 1.016 08/14/2011 2130   PHURINE 5.5 08/14/2011 2130   GLUCOSEU NEGATIVE 08/14/2011 2130   HGBUR NEGATIVE 08/14/2011 2130   BILIRUBINUR NEGATIVE 08/14/2011 2130   KETONESUR NEGATIVE 08/14/2011 2130   PROTEINUR NEGATIVE 08/14/2011 2130   UROBILINOGEN 0.2 08/14/2011 2130   NITRITE NEGATIVE 08/14/2011 2130   LEUKOCYTESUR NEGATIVE 08/14/2011 2130       Cultures:    Component Value Date/Time   SDES URINE, RANDOM BAG PED 07/25/2012 0145    SPECREQUEST NONE 07/25/2012 0145   CULT NO GROWTH 07/25/2012 0145   REPTSTATUS 07/26/2012 FINAL 07/25/2012 0145     Radiological Exams on Admission: Dg Chest 2 View  Result Date: 12/17/2017 CLINICAL DATA:  Sepsis EXAM: CHEST - 2 VIEW COMPARISON:  01/16/2014 FINDINGS: Mild interstitial edema. Heart size and mediastinal contours are stable with minimal aortic atherosclerosis. No pulmonary consolidation. No acute osseous abnormality. IMPRESSION: Mild interstitial edema. No acute pulmonary consolidation. Aortic atherosclerosis. Electronically Signed   By: Ashley Royalty M.D.   On: 12/17/2017 23:05   US Abdomen Limited Ruq  Result Date: 12/17/2017 CLINICAL DATA:  Right upper quadrant abdominal pain and jaundice. EXAM: ULTRASOUND ABDOMEN LIMITED RIGHT UPPER QUADRANT COMPARISON:  None. FINDINGS: Gallbladder: Tumefactive biliary sludge is identified within the gallbladder. The single wall thickness of the gallbladder is 4.2 mm, normal less than 3 mm. No pericholecystic fluid. No sonographic Murphy sign noted by sonographer. Common bile duct: Diameter: 5 mm Liver: No focal lesion identified given limitations due to the patient's inability to adequately position for better images. Within normal limits in parenchymal echogenicity. Portal vein is patent on color Doppler imaging though indeterminate in direction of blood flow given mixed color signal noted within. Nodular liver surface compatible with cirrhosis. Adjacent small volume of ascites. IMPRESSION: 1. Thick-walled gallbladder with biliary sludge noted within. 2. Cirrhotic appearing liver with adjacent small volume of perihepatic ascites. Mixed color Doppler signal within the imaged portal venous system may represent early changes of portal hypertension though the portal vein is patent. Electronically Signed   By: Ashley Royalty M.D.   On: 12/17/2017 19:55    Chart has been reviewed    Assessment/Plan  84 y.o. male with medical history significant of  prostate cancer, gout, hematuria,hTN, HLD Admitted for and elevated LFTs in the setting of possible early cholangitis  Present on Admission:  . Elevated LFTs suspect secondary to biliary obstruction patient also has history of alcohol abuse.  Denies any acetaminophen use.  Order hepatitis panel appreciate Martin consult . Cholangitis appreciate Martin consult MRCP has been done we will await results keep n.p.o. for now . Sepsis ([place]) most likely intra-abdominal etiology.  Per pharmacy recommendations started cefepime and Flagyl obtain blood cultures follow lactic acid . Atrial fibrillation with RVR ([place]) new diagnosis currently heart rate has come down.  Will rehydrate, order echogram cycle cardiac enzymes,  Initiate heparin drip.  Currently heart rate below 120 was soft blood pressures we will hold off on diltiazem for now . Essential hypertension -soft blood pressures we will hold home medications  . Alcohol abuse -we will monitor for any sign of withdrawal patient has not been drinking for the past 1 week if no evidence of withdrawal at this point . Elevated troponin denies any chest pain no EKG changes suggestive of acute ischemia most likely demand will need echogram in a.m. continue cycle cardiac enzymes email cardiology   . Debility once stable clinically will need PT OT evaluation . Dehydration we will rehydrate and monitor fluid status . Obstructive sleep apnea (adult) restart CPAP when able to tolerate  Other plan as per orders.  DVT prophylaxis:  SCD   Code Status:  DNR/DNI  as per patient  I had personally discussed CODE STATUS with patient   Family Communication:   Family   at  Bedside  plan of care was discussed with  Daughter grand daughter Disposition Plan:     To home once workup is complete and patient is stable                     Would benefit from PT/OT eval prior to DC   ordered  Consults called: Martin   Admission status:     inpatient      Level of care   stepdown           Kavin Weckwerth 12/18/2017, 1:11 AM    [facility]  Pager [phone]   after 2 AM please page floor coverage PA If 7AM-7PM, please contact the day team taking care of the patient  [email or web address]  Password [email or web address]

## 2017-12-17 NOTE — ED Triage Notes (Signed)
PT from home; jaundice; [place] scheduled for liver evaluation today. EMS called to check out for weakness. New onset afib. Rate 110. A&O but hard of hearing.

## 2017-12-17 NOTE — ED Provider Notes (Addendum)
[facility] EMERGENCY DEPARTMENT Provider Note   CSN: [id number] Arrival date & time: 12/17/17  1500     History   Chief Complaint Chief Complaint  Patient presents with  . Atrial Fibrillation  . Weakness    HPI Lance Martin is a 84 y.o. male.  HPI Patient presents with concern of weakness, nausea, abdominal pain. Patient has been feeling increasing weakness over the past week, in particular over the past 2 days. The pain is focally in the right upper quadrant, sore, moderate. There is associated anorexia and nausea, and the patient's daughter states that his skin has changed color during this timeframe. No clear alleviating or exacerbating signs. Patient was unaware of atrial fibrillation until he went to his physician's office, was found to have atrial fibrillation. This was yesterday, and today, the patient was scheduled for ultrasound, but was too weak to go to that exam. He denies a history of malignancy, hepatobiliary dysfunction. Past Medical History:  Diagnosis Date  . Arthritis   . AV block 05/13 ecg   1st degree  . Bleeding    bladder,radiation cystitis-s/p cystoscopy, clot evacuation, fulgeration of bleeders  . Cancer ([place])    prostate, hx of bsal cell skin cancer   . Cataract   . Gout   . Hematuria   . Hemorrhoid   . History of basal cell carcinoma excision 08-29-2011  left frontal scalp  . History of gout PER PT -  STABLE  . History of prostate cancer 2004   -  RX EXTERNAL RADIATION  . Hx of subdural hematoma 08-14-2011    s/p total left hip  12-92012---  resolved w/ no residual  . Hyperlipidemia   . Hypertension   . IFG (impaired fasting glucose)   . Lab findings of hypoxemia    abnormal ONO , dr perini. 05-26-2012  . Microalbuminuria 2011  . OA (osteoarthritis) of hip   . Obstructive apnea    AHI of 35 on 01-14-13 , titrated to 6 cm H20  . Pneumonia    hx of 2015   . Shoulder impingement syndrome    right  . Sleep apnea    cpap nightly  . Sleep apnea with use of continuous positive airway pressure (CPAP) 03/19/2013    Patient Active Problem List   Diagnosis Date Noted  . Elevated LFTs 12/17/2017  . Cholangitis 12/17/2017  . Sepsis ([place]) 12/17/2017  . Atrial fibrillation with RVR ([place]) 12/17/2017  . Essential hypertension 12/17/2017  . Alcohol abuse 12/17/2017  . Rectal bleeding 09/04/2017  . Nausea without vomiting 09/04/2017  . Sleep apnea with use of continuous positive airway pressure (CPAP) 03/19/2013  . Obstructive sleep apnea (adult) (pediatric) 02/05/2013  . Obstructive apnea   . Hypersomnia, persistent 01/01/2013  . Lab findings of hypoxemia     Past Surgical History:  Procedure Laterality Date  . CARDIAC CATHETERIZATION     pt denies  . COLONOSCOPY    . CYSTOSCOPY WITH BIOPSY  07/03/2012   Procedure: CYSTOSCOPY WITH BIOPSY;  Surgeon: Franchot Gallo, MD;  Location: [facility];  Service: Urology;  Laterality: N/A;      . CYSTOSCOPY WITH FULGERATION N/A 12/02/2016   Procedure: CYSTOSCOPY WITH FULGERATION OF BLEEDERS AND TRANSURETRAL INCISION OF PROSTATE;  Surgeon: Franchot Gallo, MD;  Location: [facility];  Service: Urology;  Laterality: N/A;  . EYE SURGERY  2009   bilateral cataract surgery   . JOINT REPLACEMENT    . MOHS SURGERY    .  PROSTATE SURGERY    . RIGHT ELBOW SURGERY  MAR 2013  . right elbow surgery  2009   ELBOW AND WRIST SURGERY  . TONSILLECTOMY     AGE 74  . TOTAL HIP ARTHROPLASTY  07/31/2011   Procedure: TOTAL HIP ARTHROPLASTY;  Surgeon: Dione Plover Aluisio;  Location: [facility];  Service: Orthopedics;  Laterality: Left;  . TOTAL HIP ARTHROPLASTY    . TRANSURETHRAL RESECTION OF PROSTATE  07/25/2012   Procedure: TRANSURETHRAL RESECTION OF THE PROSTATE WITH GYRUS INSTRUMENTS;  Surgeon: Alexis Frock, MD;  Location: [facility];  Service: Urology;  Laterality: N/A;  CYSTOSCOPY;CLOT EVACUATION WITH FULGERATION  . ULNAR NERVE REPAIR  04/13        Home Medications      Prior to Admission medications   Medication Sig Start Date End Date Taking? Authorizing Provider  amLODipine (NORVASC) 2.5 MG tablet Take 2.5 mg by mouth every evening.  11/25/16  Yes [provider]  atorvastatin (LIPITOR) 40 MG tablet Take 40 mg by mouth daily.   Yes [provider]  benazepril (LOTENSIN) 40 MG tablet Take 40 mg by mouth at bedtime.    Yes [provider]  calcium-vitamin D (OSCAL WITH D) 500-200 MG-UNIT tablet Take 1 tablet by mouth.   Yes [provider]  cetirizine (ZYRTEC) 10 MG tablet Take 10 mg by mouth daily.   Yes [provider]  cholecalciferol (VITAMIN D) 1000 units tablet Take 1,000 Units by mouth daily.   Yes [provider]  Coenzyme Q10 (COQ-10) 100 MG CAPS Take 1 Can by mouth as needed.    Yes [provider]  Flaxseed, Linseed, (FLAXSEED OIL PO) Take 1,400 mg by mouth daily. Will stop prior to procedure   Yes [provider]  fluticasone (FLONASE) 50 MCG/ACT nasal spray Place 2 sprays into the nose daily. ALLERGIES   Yes [provider]  metoprolol succinate (TOPROL-XL) 25 MG 24 hr tablet Take 25 mg by mouth daily.   Yes [provider]  Multiple Vitamins-Minerals (MULTIVITAMINS THER. W/MINERALS) TABS Take 1 tablet by mouth daily.    Yes [provider]  TURMERIC PO Take 1,000 mg by mouth 2 (two) times daily.   Yes [provider]  Febuxostat 80 MG TABS Take 80 mg by mouth daily.     [provider]  omeprazole (PRILOSEC) 40 MG capsule Take 1 capsule (40 mg total) by mouth daily. 09/04/17   Zehr, Laban Emperor, PA-C    Family History Family History  Problem Relation Age of Onset  . Colon cancer Neg Hx   . Stomach cancer Neg Hx   . Esophageal cancer Neg Hx     Social History Social History   Tobacco Use  . Smoking status: Never Smoker  . Smokeless tobacco: Never Used  Substance Use Topics  . Alcohol use: Yes    Alcohol/week: 15.0 oz     Types: 4 Glasses of wine, 21 Shots of liquor per week    Comment: 4 drinks per day , all in whiskey category, 1/2 glass of wine at dinner   . Drug use: No     Allergies   Allopurinol and Ampicillin   Review of Systems Review of Systems  Constitutional:       Per HPI, otherwise negative  HENT:       Per HPI, otherwise negative  Respiratory:       Per HPI, otherwise negative  Cardiovascular:       Per HPI, otherwise  negative  Gastrointestinal: Positive for nausea and vomiting.  Endocrine:       Negative aside from HPI  Genitourinary:       Neg aside from HPI   Musculoskeletal:       Per HPI, otherwise negative  Skin: Positive for color change.  Neurological: Positive for weakness. Negative for syncope.     Physical Exam Updated Vital Signs BP 127/61 (BP Location: Right Arm)   Pulse 97   Temp 99.8 F (37.7 C) (Oral)   Resp 20   SpO2 97%   Physical Exam  Constitutional: He is oriented to person, place, and time. He appears well-developed. No distress.  HENT:  Head: Normocephalic and atraumatic.  Eyes: Conjunctivae and EOM are normal.  Cardiovascular: An irregularly irregular rhythm present. Tachycardia present.  Pulmonary/Chest: Effort normal. No stridor. No respiratory distress.  Abdominal: He exhibits no distension. There is tenderness in the right upper quadrant. There is guarding.  Musculoskeletal: He exhibits no edema.  Neurological: He is alert and oriented to person, place, and time.  Skin: Skin is warm and dry.  jaundice  Psychiatric: He has a normal mood and affect.  Nursing note and vitals reviewed.    ED Treatments / Results  Labs (all labs ordered are listed, but only abnormal results are displayed) Labs Reviewed  BRAIN NATRIURETIC PEPTIDE - Abnormal; Notable for the following components:      Result Value   B Natriuretic Peptide 330.2 (*)    All other components within normal limits  CBC WITH DIFFERENTIAL/PLATELET - Abnormal; Notable for the  following components:   WBC 18.2 (*)    RBC 3.85 (*)    HCT 37.7 (*)    MCH 35.3 (*)    MCHC 36.1 (*)    All other components within normal limits  COMPREHENSIVE METABOLIC PANEL - Abnormal; Notable for the following components:   Sodium 134 (*)    Chloride 98 (*)    BUN 47 (*)    Creatinine, Ser 1.98 (*)    Calcium 8.4 (*)    Total Protein 5.7 (*)    Albumin 2.6 (*)    AST 105 (*)    ALT 64 (*)    Alkaline Phosphatase 196 (*)    Total Bilirubin 5.4 (*)    GFR calc non Af Amer 30 (*)    GFR calc Af Amer 34 (*)    All other components within normal limits  LIPASE, BLOOD - Abnormal; Notable for the following components:   Lipase 121 (*)    All other components within normal limits  CULTURE, BLOOD (ROUTINE X 2)  CULTURE, BLOOD (ROUTINE X 2)  ETHANOL  PROTIME-INR  TROPONIN I  TROPONIN I  TROPONIN I  URINALYSIS, ROUTINE W REFLEX MICROSCOPIC  LACTIC ACID, PLASMA  LACTIC ACID, PLASMA  PROCALCITONIN  APTT    EKG EKG Interpretation  Date/Time:  Wednesday Dec 17 2017 15:15:07 EDT Ventricular Rate:  112 PR Interval:    QRS Duration: 136 QT Interval:  355 QTC Calculation: 485 R Axis:   -56 Text Interpretation:  Atrial fibrillation Abnormal ekg Confirmed by Carmin Muskrat [id number]) on 12/17/2017 4:38:40 PM   Radiology US Abdomen Limited Ruq  Result Date: 12/17/2017 CLINICAL DATA:  Right upper quadrant abdominal pain and jaundice. EXAM: ULTRASOUND ABDOMEN LIMITED RIGHT UPPER QUADRANT COMPARISON:  None. FINDINGS: Gallbladder: Tumefactive biliary sludge is identified within the gallbladder. The single wall thickness of the gallbladder is 4.2 mm, normal less than 3 mm. No pericholecystic fluid. No  sonographic Percell Miller sign noted by sonographer. Common bile duct: Diameter: 5 mm Liver: No focal lesion identified given limitations due to the patient's inability to adequately position for better images. Within normal limits in parenchymal echogenicity. Portal vein is patent on color  Doppler imaging though indeterminate in direction of blood flow given mixed color signal noted within. Nodular liver surface compatible with cirrhosis. Adjacent small volume of ascites. IMPRESSION: 1. Thick-walled gallbladder with biliary sludge noted within. 2. Cirrhotic appearing liver with adjacent small volume of perihepatic ascites. Mixed color Doppler signal within the imaged portal venous system may represent early changes of portal hypertension though the portal vein is patent. Electronically Signed   By: Ashley Royalty M.D.   On: 12/17/2017 19:55    Procedures Procedures (including critical care time)  Medications Ordered in ED Medications  metroNIDAZOLE (FLAGYL) IVPB 500 mg (has no administration in time range)  ceFEPIme (MAXIPIME) 2 g in sodium chloride 0.9 % 100 mL IVPB (has no administration in time range)     Initial Impression / Assessment and Plan / ED Course  I have reviewed the triage vital signs and the nursing notes.  Pertinent labs & imaging results that were available during my care of the patient were reviewed by me and considered in my medical decision making (see chart for details).     Exam the patient is in similar condition. 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 We discussed all findings as far including hyperbilirubinemia, elevated lipase, elevated creatinine.  Discussed patient's case with his gastroenterology team, and we agreed to proceed with MRCP, in a.m. gastroenterology evaluation. Given the patient's leukocytosis, and abnormal gallbladder findings, patient will receive broad spectrum empiric antibiotics as well, pending additional imaging.  He does have new atrial fibrillation, though this requires additional evaluation, it is likely due to his other ongoing pathologic process.  He denies any chest pain or dyspnea currently.   9:47 PM Patient in similar  condition, mildly tachycardic, but awake and alert, speaking clearly. We discussed the findings again, need for admission, MRCP, further evaluation and management. Given the patient's new liver dysfunction, persistent renal dysfunction, and new hyperbilirubinemia, there is concern for infection versus mass versus other inflammatory condition, and he will be admitted for further evaluation and management.  Final Clinical Impressions(s) / ED Diagnoses   Final diagnoses:  Liver dysfunction  Atrial fibrillation SIRS   Carmin Muskrat, MD 12/17/17 2150   CRITICAL CARE Performed by: Carmin Muskrat Total critical care time: 35 minutes Critical care time was exclusive of separately billable procedures and treating other patients. Critical care was necessary to treat or prevent imminent or life-threatening deterioration. Critical care was time spent personally by me on the following activities: development of treatment plan with patient and/or surrogate as well as nursing, discussions with consultants, evaluation of patient's response to treatment, examination of patient, obtaining history from patient or surrogate, ordering and performing treatments and interventions, ordering and review of laboratory studies, ordering and review of radiographic studies, pulse oximetry and re-evaluation of patient's condition.    Carmin Muskrat, MD 12/17/17 2217

## 2017-12-17 NOTE — ED Notes (Signed)
Patient transported to Ultrasound 

## 2017-12-18 ENCOUNTER — Encounter (HOSPITAL_COMMUNITY): Payer: Self-pay | Admitting: General Surgery

## 2017-12-18 ENCOUNTER — Inpatient Hospital Stay (HOSPITAL_COMMUNITY): Payer: Medicare Other

## 2017-12-18 DIAGNOSIS — A419 Sepsis, unspecified organism: Principal | ICD-10-CM

## 2017-12-18 DIAGNOSIS — K766 Portal hypertension: Secondary | ICD-10-CM

## 2017-12-18 DIAGNOSIS — R933 Abnormal findings on diagnostic imaging of other parts of digestive tract: Secondary | ICD-10-CM

## 2017-12-18 DIAGNOSIS — D696 Thrombocytopenia, unspecified: Secondary | ICD-10-CM

## 2017-12-18 DIAGNOSIS — K7031 Alcoholic cirrhosis of liver with ascites: Secondary | ICD-10-CM

## 2017-12-18 DIAGNOSIS — Z01810 Encounter for preprocedural cardiovascular examination: Secondary | ICD-10-CM

## 2017-12-18 DIAGNOSIS — L8990 Pressure ulcer of unspecified site, unspecified stage: Secondary | ICD-10-CM

## 2017-12-18 DIAGNOSIS — D689 Coagulation defect, unspecified: Secondary | ICD-10-CM

## 2017-12-18 DIAGNOSIS — I340 Nonrheumatic mitral (valve) insufficiency: Secondary | ICD-10-CM

## 2017-12-18 DIAGNOSIS — R17 Unspecified jaundice: Secondary | ICD-10-CM

## 2017-12-18 LAB — LACTIC ACID, PLASMA: LACTIC ACID, VENOUS: 1.5 mmol/L (ref 0.5–1.9)

## 2017-12-18 LAB — URINALYSIS, ROUTINE W REFLEX MICROSCOPIC
BACTERIA UA: NONE SEEN
Bilirubin Urine: NEGATIVE
GLUCOSE, UA: NEGATIVE mg/dL
HGB URINE DIPSTICK: NEGATIVE
Ketones, ur: NEGATIVE mg/dL
LEUKOCYTES UA: NEGATIVE
NITRITE: NEGATIVE
Protein, ur: 100 mg/dL — AB
SPECIFIC GRAVITY, URINE: 1.015 (ref 1.005–1.030)
pH: 5 (ref 5.0–8.0)

## 2017-12-18 LAB — ECHOCARDIOGRAM COMPLETE
Height: 68 in
WEIGHTICAEL: 2560 [oz_av]

## 2017-12-18 LAB — PROTIME-INR
INR: 1.72
PROTHROMBIN TIME: 20 s — AB (ref 11.4–15.2)

## 2017-12-18 LAB — TSH: TSH: 1.069 u[IU]/mL (ref 0.350–4.500)

## 2017-12-18 LAB — MAGNESIUM: Magnesium: 1.8 mg/dL (ref 1.7–2.4)

## 2017-12-18 LAB — CBC
HCT: 33.8 % — ABNORMAL LOW (ref 39.0–52.0)
HEMATOCRIT: 34.8 % — AB (ref 39.0–52.0)
HEMOGLOBIN: 12.1 g/dL — AB (ref 13.0–17.0)
HEMOGLOBIN: 12.2 g/dL — AB (ref 13.0–17.0)
MCH: 35 pg — ABNORMAL HIGH (ref 26.0–34.0)
MCH: 34.1 pg — AB (ref 26.0–34.0)
MCHC: 35.8 g/dL (ref 30.0–36.0)
MCHC: 35.1 g/dL (ref 30.0–36.0)
MCV: 97.7 fL (ref 78.0–100.0)
MCV: 97.2 fL (ref 78.0–100.0)
Platelets: 160 10*3/uL (ref 150–400)
Platelets: 146 10*3/uL — ABNORMAL LOW (ref 150–400)
RBC: 3.46 MIL/uL — ABNORMAL LOW (ref 4.22–5.81)
RBC: 3.58 MIL/uL — ABNORMAL LOW (ref 4.22–5.81)
RDW: 14.8 % (ref 11.5–15.5)
RDW: 14.7 % (ref 11.5–15.5)
WBC: 21.5 10*3/uL — ABNORMAL HIGH (ref 4.0–10.5)
WBC: 21.3 10*3/uL — ABNORMAL HIGH (ref 4.0–10.5)

## 2017-12-18 LAB — COMPREHENSIVE METABOLIC PANEL
ALK PHOS: 167 U/L — AB (ref 38–126)
ALT: 56 U/L (ref 17–63)
ANION GAP: 13 (ref 5–15)
AST: 86 U/L — ABNORMAL HIGH (ref 15–41)
Albumin: 2.4 g/dL — ABNORMAL LOW (ref 3.5–5.0)
BILIRUBIN TOTAL: 5.4 mg/dL — AB (ref 0.3–1.2)
BUN: 43 mg/dL — ABNORMAL HIGH (ref 6–20)
CALCIUM: 8.2 mg/dL — AB (ref 8.9–10.3)
CO2: 22 mmol/L (ref 22–32)
Chloride: 100 mmol/L — ABNORMAL LOW (ref 101–111)
Creatinine, Ser: 1.78 mg/dL — ABNORMAL HIGH (ref 0.61–1.24)
GFR, EST AFRICAN AMERICAN: 39 mL/min — AB (ref >60)
GFR, EST NON AFRICAN AMERICAN: 34 mL/min — AB (ref >60)
Glucose, Bld: 77 mg/dL (ref 65–99)
POTASSIUM: 3.2 mmol/L — AB (ref 3.5–5.1)
Sodium: 135 mmol/L (ref 135–145)
TOTAL PROTEIN: 5.2 g/dL — AB (ref 6.5–8.1)

## 2017-12-18 LAB — HEPARIN LEVEL (UNFRACTIONATED)
Heparin Unfractionated: 0.47 IU/mL (ref 0.30–0.70)
Heparin Unfractionated: 0.72 IU/mL — ABNORMAL HIGH (ref 0.30–0.70)

## 2017-12-18 LAB — PHOSPHORUS: PHOSPHORUS: 3.8 mg/dL (ref 2.5–4.6)

## 2017-12-18 LAB — APTT: aPTT: 189 seconds (ref 24–36)

## 2017-12-18 LAB — TROPONIN I: Troponin I: 0.03 ng/mL (ref <0.03)

## 2017-12-18 MED ORDER — ACETAMINOPHEN 325 MG PO TABS: 325.0000 mg | ORAL_TABLET | Freq: Four times a day (QID) | ORAL | Status: DC | PRN

## 2017-12-18 MED ORDER — TRAMADOL HCL 50 MG PO TABS: 50.0000 mg | ORAL_TABLET | Freq: Four times a day (QID) | ORAL | Status: DC | PRN

## 2017-12-18 MED ORDER — HEPARIN (PORCINE) IN NACL 100-0.45 UNIT/ML-% IJ SOLN
1100.0000 [IU]/h | INTRAMUSCULAR | Status: DC
  Administered 2017-12-18: 1100 [IU]/h via INTRAVENOUS
  Filled 2017-12-18 (×2): qty 250

## 2017-12-18 MED ORDER — HEPARIN BOLUS VIA INFUSION
2000.0000 [IU] | Freq: Once | INTRAVENOUS | Status: AC
  Administered 2017-12-18: 2000 [IU] via INTRAVENOUS
  Filled 2017-12-18: qty 2000

## 2017-12-18 MED ORDER — HEPARIN BOLUS VIA INFUSION
3600.0000 [IU] | Freq: Once | INTRAVENOUS | Status: DC
  Filled 2017-12-18: qty 3600

## 2017-12-18 MED ORDER — MORPHINE SULFATE (PF) 4 MG/ML IV SOLN: 2.0000 mg | INTRAVENOUS | Status: DC | PRN

## 2017-12-18 MED ORDER — SODIUM CHLORIDE 0.9 % IV SOLN
INTRAVENOUS | Status: DC
  Administered 2017-12-18 (×2): via INTRAVENOUS
  Administered 2017-12-20: 75 mL/h via INTRAVENOUS
  Administered 2017-12-20: 50 mL/h via INTRAVENOUS

## 2017-12-18 NOTE — Progress Notes (Signed)
ANTICOAGULATION CONSULT NOTE - Follow Up Consult  Pharmacy Consult for heparin Indication: atrial fibrillation  Allergies  Allergen Reactions  . Allopurinol Hives and Other (See Comments)    Cause renal failure   . Ampicillin Diarrhea and Nausea And Vomiting    Patient Measurements: Height: 5\' 8"  (172.7 cm) Weight: 160 lb (72.6 kg) IBW/kg (Calculated) : 68.4 Heparin Dosing Weight: 72.6 Kg  Vital Signs: Temp: 98.6 F (37 C) (05/09 1230) Temp Source: Oral (05/09 1230) BP: 128/75 (05/09 1230) Pulse Rate: 102 (05/09 1230)  Labs: Recent Labs    12/17/17 1521 12/17/17 1823 12/17/17 2123 12/18/17 0651 12/18/17 0935  HGB 13.6  --   --  12.1* 12.2*  HCT 37.7*  --   --  33.8* 34.8*  PLT 176  --   --  160 146*  APTT  --   --  38* 189*  --   LABPROT  --   --  17.5* 20.0*  --   INR  --   --  1.45 1.72  --   HEPARINUNFRC  --   --   --   --  0.47  CREATININE  --  1.98*  --  1.78*  --   TROPONINI  --   --  0.10* 0.03* <0.03   Estimated Creatinine Clearance: 30.4 mL/min (A) (by C-G formula based on SCr of 1.78 mg/dL (H)).  Medical History: Past Medical History:  Diagnosis Date  . Arthritis   . AV block 05/13 ecg   1st degree  . Bleeding    bladder,radiation cystitis-s/p cystoscopy, clot evacuation, fulgeration of bleeders  . Cancer ([place])    prostate, hx of bsal cell skin cancer   . Cataract   . Gout   . Hematuria   . Hemorrhoid   . History of basal cell carcinoma excision 08-29-2011  left frontal scalp  . History of gout PER PT -  STABLE  . History of prostate cancer 2004   -  RX EXTERNAL RADIATION  . Hx of subdural hematoma 08-14-2011    s/p total left hip  12-92012---  resolved w/ no residual  . Hyperlipidemia   . Hypertension   . IFG (impaired fasting glucose)   . Lab findings of hypoxemia    abnormal ONO , dr perini. 05-26-2012  . Microalbuminuria 2011  . OA (osteoarthritis) of hip   . Obstructive apnea    AHI of 35 on 01-14-13 , titrated to 6 cm H20  .  Pneumonia    hx of 2015   . Shoulder impingement syndrome    right  . Sleep apnea    cpap nightly  . Sleep apnea with use of continuous positive airway pressure (CPAP) 03/19/2013   Assessment: 84 yoM with newly diagnosed afib presents to the ED with RUQ abdominal pain. No anticoagulation reported PTA. INR 1.45 Currently on IV heparin at 1100 units/hr. Initial HL is therapeutic at 0.47 but was drawn early. H/H low stable  Goal of Therapy:  Heparin level 0.3-0.7 units/ml Monitor platelets by anticoagulation protocol: Yes   Plan:  Continue heparin infusion at 1100 units/hr Check confirmatory anti-Xa level in 8 hours and daily while on heparin Continue to monitor H&H and platelets  Albertina Parr, PharmD., BCPS Clinical Pharmacist Clinical phone for 12/18/17 until 3:30pm: [phone] If after 3:30pm, please call main pharmacy at: [phone]

## 2017-12-18 NOTE — Consult Note (Addendum)
[facility] Consult: 9:41 AM 12/18/2017  LOS: 1 day    Referring Provider:  Dr Posey Pronto  Primary Care Physician:  Crist Infante, MD Primary Gastroenterologist:  Dr. Henrene Pastor     Reason for Consultation:  Jaundice, abd pain   HPI: Lance Martin is a 84 y.o. male.  Retired Retail buyer.   Hx gout.  Htn.  Prostate cancer.  S/p TURP, prostate radiation.  Urinary bladder cystitis with bleeding, treated with cystoscopy, fulguration 2013.  Non-melanoma skin cancers.  Degenerative spine disease, spinal stenosis.  L1 compression fracture in early 10/2017.  Mild fatty liver noted on CT scan 2004.   08/2001 Colonoscopy.  Initial, routine screening study.  Diminutive polyp (adenomatous) in ascending colon removed.  05/2004 Esophagram.  Small, sliding hiatal hernia.  Minor GE reflux 01/2008 colonoscopy for adenomatous polyp surveillance.  three, 2 - 3 mm polyps (tubular adenomas) removed from cecum and transverse colon. 03/2013 Colonoscopy.  For adenomatous polyp surveillance.  Diminutive transverse colon polyp (TA) removed.  Otherwise normal study. 03/2016 EGD.  For dysphagia.  Mildly severe reflux esophagitis, otherwise normal study.  No strictures. 10/09/2017 Colonoscopy.  For rectal bleeding and hx adenomatous polyps.  Two, 1 to 2 mm, polyps (TAs) removed from rectum and descending colon.  Internal hemorrhoids.  Patient's wife passed away suddenly, from complications of a perforated colon 6 months ago.  Patient has a lifelong habit of daily drinking.  Normally drinks vodka with dinner and whiskey up until bedtime.  He admits to drinking 3 or 4 cocktails nightly.  Family thinks he may have been drinking a bit more since the death of his wife but they had not seen that it was a problem.  His appetite is generally diminished.  On  Tuesday and Wednesday of last week he had nausea and dry heaves.  After that he had generalized abdominal pain from retching but eventually the pain localized to the right upper quadrant and persisted.  His appetite diminished and he was mostly consuming Ensure and Gatorade.  He has not been drinking any alcohol since the days with nausea last week.  Along with persistent pain has been a generalized weakness that has progressed to the point where he was hardly able to get up.  Pain is worse with movement or deep breathing.  He was seen by his PMD earlier this week and was supposed to have an ultrasound yesterday but he was not able to get up to get into the car.  His family called EMS and he was transported to the ED.  Daughter tried to get him to come to the emergency department over the weekend because he looked gray and noticeably week.  He has slower than normal speech but no overt confusion in last 2 days.  Yesterday she noted jaundice and scleral icterus.  Urine is darker but not amber-colored.  Current weight is 160 #, he previously recalls weighing 167# Once he was in the ED he was noted to be in A. fib with a rate around 110.  WBCs 18.5 >> 21.5.  T bili 5.4.  Alkaline phosphatase 196 >> 167.  AST/ALT 105/64 >> 86/56 Lipase 121.   PT/INR/PTT 20/1.7/189 within an hour of starting IV heparin.  Initial PT/INR was 17.5/1.4 prior to starting heparin. Platelets 176 >> 146.   AKI with GFR 34.  K 3.2.   Troponin I 0.10 >> 0.03.    Abdominal ultrasound.  Gallbladder wall thickening.  Biliary sludge. 5 mm CBD without filling defect.  Cirrhotic liver and small perihepatic ascites.  Mixed Doppler signal in portal venous system possibly representing early changes of portal hypertension but portal vein patent. MRI/MRCP reveals cirrhosis and mild ascites, gallbladder dilated and contains sludge.  Diffuse gallbladder wall thickening and pericholecystic edema more prominent than typically seen with cirrhosis,  cannot exclude acute cholecystitis.  5 mm CBD  Pain of the right upper quadrant has improved.  He has not had any fever or chills.  Cefepime, metronidazole and IV heparin have been initiated.  Heart rate is still irregular. Awaiting results of 2D echocardiogram.  He has not been seen by cardiology or surgery.   Past Medical History:  Diagnosis Date  . Arthritis   . AV block 05/13 ecg   1st degree  . Bleeding    bladder,radiation cystitis-s/p cystoscopy, clot evacuation, fulgeration of bleeders  . Cancer ([place])    prostate, hx of bsal cell skin cancer   . Cataract   . Gout   . Hematuria   . Hemorrhoid   . History of basal cell carcinoma excision 08-29-2011  left frontal scalp  . History of gout PER PT -  STABLE  . History of prostate cancer 2004   -  RX EXTERNAL RADIATION  . Hx of subdural hematoma 08-14-2011    s/p total left hip  12-92012---  resolved w/ no residual  . Hyperlipidemia   . Hypertension   . IFG (impaired fasting glucose)   . Lab findings of hypoxemia    abnormal ONO , dr perini. 05-26-2012  . Microalbuminuria 2011  . OA (osteoarthritis) of hip   . Obstructive apnea    AHI of 35 on 01-14-13 , titrated to 6 cm H20  . Pneumonia    hx of 2015   . Shoulder impingement syndrome    right  . Sleep apnea    cpap nightly  . Sleep apnea with use of continuous positive airway pressure (CPAP) 03/19/2013    Past Surgical History:  Procedure Laterality Date  . CARDIAC CATHETERIZATION     pt denies  . COLONOSCOPY    . CYSTOSCOPY WITH BIOPSY  07/03/2012   Procedure: CYSTOSCOPY WITH BIOPSY;  Surgeon: Franchot Gallo, MD;  Location[facility];  Service: Urology;  Laterality: N/A;      . CYSTOSCOPY WITH FULGERATION N/A 12/02/2016   Procedure: CYSTOSCOPY WITH FULGERATION OF BLEEDERS AND TRANSURETRAL INCISION OF PROSTATE;  Surgeon: Franchot Gallo, MD;  Location: [facility];  Service: Urology;  Laterality: N/A;  . EYE SURGERY  2009   bilateral cataract surgery    . JOINT REPLACEMENT    . MOHS SURGERY    . PROSTATE SURGERY    . RIGHT ELBOW SURGERY  MAR 2013  . right elbow surgery  2009   ELBOW AND WRIST SURGERY  . TONSILLECTOMY     AGE 37  . TOTAL HIP ARTHROPLASTY  07/31/2011   Procedure: TOTAL HIP ARTHROPLASTY;  Surgeon: Dione Plover Aluisio;  Location: [facility];  Service: Orthopedics;  Laterality: Left;  . TOTAL HIP ARTHROPLASTY    .  TRANSURETHRAL RESECTION OF PROSTATE  07/25/2012   Procedure: TRANSURETHRAL RESECTION OF THE PROSTATE WITH GYRUS INSTRUMENTS;  Surgeon: Alexis Frock, MD;  Location: [facility];  Service: Urology;  Laterality: N/A;  CYSTOSCOPY;CLOT EVACUATION WITH FULGERATION  . ULNAR NERVE REPAIR  04/13    Prior to Admission medications   Medication Sig Start Date End Date Taking? Authorizing Provider  amLODipine (NORVASC) 2.5 MG tablet Take 2.5 mg by mouth every evening.  11/25/16  Yes [provider]  atorvastatin (LIPITOR) 40 MG tablet Take 40 mg by mouth daily.   Yes [provider]  benazepril (LOTENSIN) 40 MG tablet Take 40 mg by mouth at bedtime.    Yes [provider]  calcium-vitamin D (OSCAL WITH D) 500-200 MG-UNIT tablet Take 1 tablet by mouth.   Yes [provider]  cetirizine (ZYRTEC) 10 MG tablet Take 10 mg by mouth daily.   Yes [provider]  cholecalciferol (VITAMIN D) 1000 units tablet Take 1,000 Units by mouth daily.   Yes [provider]  Coenzyme Q10 (COQ-10) 100 MG CAPS Take 1 Can by mouth as needed.    Yes [provider]  Flaxseed, Linseed, (FLAXSEED OIL PO) Take 1,400 mg by mouth daily. Will stop prior to procedure   Yes [provider]  fluticasone (FLONASE) 50 MCG/ACT nasal spray Place 2 sprays into the nose daily. ALLERGIES   Yes [provider]  metoprolol succinate (TOPROL-XL) 25 MG 24 hr tablet Take 25 mg by mouth daily.   Yes [provider]  Multiple Vitamins-Minerals (MULTIVITAMINS THER. W/MINERALS) TABS Take 1 tablet  by mouth daily.    Yes [provider]  TURMERIC PO Take 1,000 mg by mouth 2 (two) times daily.   Yes [provider]  Febuxostat 80 MG TABS Take 80 mg by mouth daily.     [provider]  omeprazole (PRILOSEC) 40 MG capsule Take 1 capsule (40 mg total) by mouth daily. 09/04/17   Zehr, [name] D, PA-C    Scheduled Meds:  Infusions: . sodium chloride 100 mL/hr at 12/18/17 0015  . ceFEPime (MAXIPIME) IV 2 g (12/18/17 0926)  . heparin 1,100 Units/hr (12/18/17 0627)  . metronidazole Stopped (12/18/17 0708)   PRN Meds: ondansetron **OR** ondansetron (ZOFRAN) IV   Allergies as of 12/17/2017 - Review Complete 12/17/2017  Allergen Reaction Noted  . Allopurinol Hives and Other (See Comments) 12/29/2007  . Ampicillin Diarrhea and Nausea And Vomiting 06/26/2012    Family History  Problem Relation Age of Onset  . Colon cancer Neg Hx   . Stomach cancer Neg Hx   . Esophageal cancer Neg Hx     Social History   Socioeconomic History  . Marital status: Married    Spouse name: [name]  . Number of children: 2  . Years of education: Not on file  . Highest education level: Not on file  Occupational History  . Occupation: Paediatric nurse  Social Needs  . Financial resource strain: Not on file  . Food insecurity:    Worry: Not on file    Inability: Not on file  . Transportation needs:    Medical: Not on file    Non-medical: Not on file  Tobacco Use  . Smoking status: Never Smoker  . Smokeless tobacco: Never Used  Substance and Sexual Activity  . Alcohol use: Yes    Alcohol/week: 15.0 oz    Types: 4 Glasses of wine, 21 Shots of liquor per week    Comment: 4 drinks per  day , all in whiskey category, 1/2 glass of wine at dinner   . Drug use: No  . Sexual activity: Not on file  Lifestyle  . Physical activity:    Days per week: Not on file    Minutes per session: Not on file  . Stress: Not on file  Relationships  . Social connections:    Talks on phone:  Not on file    Gets together: Not on file    Attends religious service: Not on file    Active member of club or organization: Not on file    Attends meetings of clubs or organizations: Not on file    Relationship status: Not on file  . Intimate partner violence:    Fear of current or ex partner: Not on file    Emotionally abused: Not on file    Physically abused: Not on file    Forced sexual activity: Not on file  Other Topics Concern  . Not on file  Social History Narrative  . Not on file    REVIEW OF SYSTEMS: Constitutional: Weakness.  Over the last few months he is had problems with his gait and balance and now using a cane.  He fell in March at which time he sustained a vertebral compression fracture. ENT:  No nose bleeds Pulm: No new dyspnea.  No cough. CV:  No palpitations, no LE edema.  No chest pain GU:  No hematuria, no frequency.  Urine deeply colored. GI:  Per HPI Heme: Denies unusual or excessive bleeding or bruising. Transfusions: No recall of previous transfusions. Neuro:  No headaches, no peripheral tingling or numbness.  Denies seizures. Derm:  No itching, no rash or sores.  Jaundice has only been noted in the last couple of days. Endocrine:  No sweats or chills.  No polyuria or dysuria Immunization: Did not inquire as to recent or past immunizations. Travel:  None beyond local counties in last few months.    PHYSICAL EXAM: Vital signs in last 24 hours: Vitals:   12/18/17 0600 12/18/17 0828  BP: (!) 121/58 (!) 103/54  Pulse: (!) 103 92  Resp: 19 20  Temp: 98.7 F (37.1 C) 99.2 F (37.3 C)  SpO2: 98% 96%   Wt Readings from Last 3 Encounters:  12/18/17 160 lb (72.6 kg)  10/09/17 170 lb (77.1 kg)  09/04/17 170 lb 8 oz (77.3 kg)    General: Slightly jaundiced, nonobese.  Looks younger than stated age. Head: No facial asymmetry or swelling.  No signs of head trauma. Eyes: Icteric sclera.  Conjunctiva pink.  EOMI. Ears: Slightly diminished  hearing Nose: No discharge Mouth: Moist, pink, clear oral mucosa.  Fair dentition.  Tongue midline without fasciculation. Neck: No JVD, no thyromegaly, no masses. Lungs: Clear bilaterally.  No cough or dyspnea. Heart: Irregularly irregular rate ~ 110.   No MRG.  S1, S2 present. Abdomen: Soft.  Minimal if any tenderness in the epigastrium/RUQ.  No guarding, no rebound.  Bowel sounds normal quality but hypoactive..   Rectal: Not performed Musc/Skeltl: No joint redness or swelling.  No obvious gross deformities. Extremities: No CCE. Neurologic: Full limb strength.  No tremor, no asterixis.  Mentation and speech is slow.  Speech is slightly slurred but accurate. Skin: Mild jaundice Tattoos: None Nodes: No cervical adenopathy. Psych: Calm, pleasant, cooperative.  Intake/Output from previous day: No intake/output data recorded. Intake/Output this shift: No intake/output data recorded.  LAB RESULTS: Recent Labs    12/17/17 1521 12/18/17 9147  WBC 18.2* 21.5*  HGB 13.6 12.1*  HCT 37.7* 33.8*  PLT 176 160   BMET Lab Results  Component Value Date   NA 135 12/18/2017   NA 134 (L) 12/17/2017   NA 136 11/29/2016   K 3.2 (L) 12/18/2017   K 3.6 12/17/2017   K 3.7 11/29/2016   CL 100 (L) 12/18/2017   CL 98 (L) 12/17/2017   CL 103 11/29/2016   CO2 22 12/18/2017   CO2 22 12/17/2017   CO2 26 11/29/2016   GLUCOSE 77 12/18/2017   GLUCOSE 97 12/17/2017   GLUCOSE 89 11/29/2016   BUN 43 (H) 12/18/2017   BUN 47 (H) 12/17/2017   BUN 19 11/29/2016   CREATININE 1.78 (H) 12/18/2017   CREATININE 1.98 (H) 12/17/2017   CREATININE 1.10 11/29/2016   CALCIUM 8.2 (L) 12/18/2017   CALCIUM 8.4 (L) 12/17/2017   CALCIUM 8.8 (L) 11/29/2016   LFT Recent Labs    12/17/17 1823 12/18/17 0651  PROT 5.7* 5.2*  ALBUMIN 2.6* 2.4*  AST 105* 86*  ALT 64* 56  ALKPHOS 196* 167*  BILITOT 5.4* 5.4*   PT/INR Lab Results  Component Value Date   INR 1.72 12/18/2017   INR 1.45 12/17/2017   INR 2.01  (H) 08/14/2011   Hepatitis Panel No results for input(s): HEPBSAG, HCVAB, HEPAIGM, HEPBIGM in the last 72 hours. C-Diff No components found for: CDIFF Lipase     Component Value Date/Time   LIPASE 121 (H) 12/17/2017 1823    Drugs of Abuse  No results found for: LABOPIA, COCAINSCRNUR, LABBENZ, AMPHETMU, THCU, LABBARB   RADIOLOGY STUDIES: Dg Chest 2 View  Result Date: 12/17/2017 CLINICAL DATA:  Sepsis EXAM: CHEST - 2 VIEW COMPARISON:  01/16/2014 FINDINGS: Mild interstitial edema. Heart size and mediastinal contours are stable with minimal aortic atherosclerosis. No pulmonary consolidation. No acute osseous abnormality. IMPRESSION: Mild interstitial edema. No acute pulmonary consolidation. Aortic atherosclerosis. Electronically Signed   By: Ashley Royalty M.D.   On: 12/17/2017 23:05   Mr Abdomen Mrcp Wo Contrast  Result Date: 12/18/2017 CLINICAL DATA:  Right upper quadrant pain and jaundice. Abnormal liver and gallbladder on ultrasound. EXAM: MRI ABDOMEN WITHOUT CONTRAST  (INCLUDING MRCP) TECHNIQUE: Multiplanar multisequence MR imaging of the abdomen was performed. Heavily T2-weighted images of the biliary and pancreatic ducts were obtained, and three-dimensional MRCP images were rendered by post processing. COMPARISON:  Ultrasound on 12/17/2017 FINDINGS: Lower chest: No acute findings. Hepatobiliary: Image degradation by motion artifact noted. Hepatic cirrhosis is demonstrated. No hepatic mass visualized on this unenhanced exam. The gallbladder is dilated and shows moderate diffuse wall thickening and pericholecystic edema. Sludge is also seen within the gallbladder lumen. These findings are greater than typically seen with cirrhosis, and acute cholecystitis cannot be excluded. There is no evidence of biliary ductal dilatation, with common bile duct measuring approximately 5 mm. Pancreas: No mass or inflammatory process visualized on this unenhanced exam. No evidence of pancreatic ductal dilatation.  Spleen:  Within normal limits in size. Adrenals/Urinary tract: Tiny right renal cyst noted. No evidence of hydronephrosis. Stomach/Bowel: Visualized portion unremarkable. Vascular/Lymphatic: No pathologically enlarged lymph nodes identified. No evidence of abdominal aortic aneurysm. Other:  Mild ascites. Musculoskeletal:  No suspicious bone lesions identified. IMPRESSION: Hepatic cirrhosis and mild ascites. Dilated gallbladder with sludge, moderate diffuse wall thickening and pericholecystic edema. These findings are greater than typically seen with cirrhosis, and acute cholecystitis cannot be excluded. If clinically warranted, nuclear medicine hepatobiliary scan could be performed for further evaluation. No evidence of  biliary ductal dilatation. Electronically Signed   By: Earle Gell M.D.   On: 12/18/2017 07:32   Mr 3d Recon At Scanner  Result Date: 12/18/2017 CLINICAL DATA:  Right upper quadrant pain and jaundice. Abnormal liver and gallbladder on ultrasound. EXAM: MRI ABDOMEN WITHOUT CONTRAST  (INCLUDING MRCP) TECHNIQUE: Multiplanar multisequence MR imaging of the abdomen was performed. Heavily T2-weighted images of the biliary and pancreatic ducts were obtained, and three-dimensional MRCP images were rendered by post processing. COMPARISON:  Ultrasound on 12/17/2017 FINDINGS: Lower chest: No acute findings. Hepatobiliary: Image degradation by motion artifact noted. Hepatic cirrhosis is demonstrated. No hepatic mass visualized on this unenhanced exam. The gallbladder is dilated and shows moderate diffuse wall thickening and pericholecystic edema. Sludge is also seen within the gallbladder lumen. These findings are greater than typically seen with cirrhosis, and acute cholecystitis cannot be excluded. There is no evidence of biliary ductal dilatation, with common bile duct measuring approximately 5 mm. Pancreas: No mass or inflammatory process visualized on this unenhanced exam. No evidence of pancreatic  ductal dilatation. Spleen:  Within normal limits in size. Adrenals/Urinary tract: Tiny right renal cyst noted. No evidence of hydronephrosis. Stomach/Bowel: Visualized portion unremarkable. Vascular/Lymphatic: No pathologically enlarged lymph nodes identified. No evidence of abdominal aortic aneurysm. Other:  Mild ascites. Musculoskeletal:  No suspicious bone lesions identified. IMPRESSION: Hepatic cirrhosis and mild ascites. Dilated gallbladder with sludge, moderate diffuse wall thickening and pericholecystic edema. These findings are greater than typically seen with cirrhosis, and acute cholecystitis cannot be excluded. If clinically warranted, nuclear medicine hepatobiliary scan could be performed for further evaluation. No evidence of biliary ductal dilatation. Electronically Signed   By: Earle Gell M.D.   On: 12/18/2017 07:32   US Abdomen Limited Ruq  Result Date: 12/17/2017 CLINICAL DATA:  Right upper quadrant abdominal pain and jaundice. EXAM: ULTRASOUND ABDOMEN LIMITED RIGHT UPPER QUADRANT COMPARISON:  None. FINDINGS: Gallbladder: Tumefactive biliary sludge is identified within the gallbladder. The single wall thickness of the gallbladder is 4.2 mm, normal less than 3 mm. No pericholecystic fluid. No sonographic Murphy sign noted by sonographer. Common bile duct: Diameter: 5 mm Liver: No focal lesion identified given limitations due to the patient's inability to adequately position for better images. Within normal limits in parenchymal echogenicity. Portal vein is patent on color Doppler imaging though indeterminate in direction of blood flow given mixed color signal noted within. Nodular liver surface compatible with cirrhosis. Adjacent small volume of ascites. IMPRESSION: 1. Thick-walled gallbladder with biliary sludge noted within. 2. Cirrhotic appearing liver with adjacent small volume of perihepatic ascites. Mixed color Doppler signal within the imaged portal venous system may represent early  changes of portal hypertension though the portal vein is patent. Electronically Signed   By: Ashley Royalty M.D.   On: 12/17/2017 19:55     IMPRESSION:   *   Abdominal pain with jaundice and associated sepsis.  Ultrasound and MRCP suggest acute cholecystitis and do not reveal any biliary ductal dilatation or filling defects. On cefepime, Flagyl.  *    New diagnosis of cirrhosis of the liver with mild ascites and possible portal venous hypertension.  Fatty liver noted on CT scan 2004. Is jaundice due to cirrhosis vs cholecystitis vs ETOH hepatitis?   Patient drinks heavily.  No alcohol for past 8 or 9 days and no signs of withdrawal.  *    Coagulopathy and thrombocytopenia in setting of heparin.  *     A. fib with RVR, newly discovered as of yesterday.  IV heparin now in place.  *   AKI.   Improved.  Baseline unknown.     PLAN:     *   Complicated picture, particularly the A. fib requiring heparin complicates things. Would ask surgery to evaluate for cholecystitis.  Surgery higher risk but cholecystostomy tube in cirrhotic with mild ascites also risky. Elevated Lipase (a little over 2 x normal) but no indication of pancreatitis on [place] or MR.    *   Continue abx.  Check ammonia, hepatitis serologies, AMA, ANA, mitochondrial Ab, smooth muscle Ab, ceruloplasmin, in AM.    *   May benefit from cardiology consultation as well, surgery would probably appreciate their input.  *   No role for ERCP. He will eventually need a screening EGD to assess for portal hypertension, varices.  *    Total abstinence from alcohol.     Azucena Freed  12/18/2017, 9:41 AM Phone [phone]      Attending physician's note   I have taken a history, examined the patient and reviewed the chart. I agree with the Advanced Practitioner's note, impression and recommendations. 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 fib on heparin gtt., right upper quadrant pain and leukocytosis.   Imaging suggestive of acute cholecystitis, no evidence of CBD dilation or obstruction. New diagnosis of cirrhosis (likely Karlene Lineman and alcoholic cirrhosis), no recent labs available in epic,  prior labs from 2013. Child Pugh class C and meld 24, unclear how much of this acute decompensation is secondary to acute cholecystitis and sepsis versus chronic liver dysfunction. Surgery planning for cholecystostomy tube, possible risk for infection and high biliary output resulting hypoalbuminemia and also possible worsening renal functions.  He would be high risk for cholecystectomy as well Continue antibiotics Discussed alcohol cessation No evidence of volume overload based on exam, no significant ascites No evidence of hepatic encephalopathy Imaging negative for [facility] We will sign off, available if have any questions Follow-up with GI  as outpatient once acute issues resolve  K. Denzil Magnuson , MD [phone]

## 2017-12-18 NOTE — Progress Notes (Signed)
  Echocardiogram 2D Echocardiogram has been performed.  Lance Martin [name] 12/18/2017, 11:18 AM

## 2017-12-18 NOTE — Consult Note (Signed)
Lance Martin 02/24/1933  [id number].    Requesting MD: Dr. 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  He provides his history himself, but is not terribly detailed and takes a while to answer.  He states for the last 8 days he has had fatigue and malaise.  He has had RUQ abdominal pain which is new for him.  He had some nausea/vomiting as well.  No blood was noted in his emesis.  He has had a BM about 4 days with no blood at that time as well.  He saw his PCP on Monday who noted an elevated TB and set him up for an [place].  He was took weak to make it to this appointment and ultimately EMS was called on Wednesday.  He was brought to [facility] where he was found to be in new onset a fib and placed on a heparin gtt.  He had an [place] that revealed gallbladder wall thickening and pericholecystic fluid and changes c/w cirrhosis.  The patient does have a history of about 4 mixed drinks per night.  He then underwent an MRCP which showed fairly significant gallbladder wall thickening c/w cholecystitis, but no stones.  He had changes c/w cirrhosis on this exam as well.  GI evaluated him and asked [place] to see him as well due to concerns for cholecystitis in the setting of cirrhosis.  He denies fevers, but does have a WBC of 21K.  ROS: ROS: Please see HPI, otherwise denies all other systems  Family History  Problem Relation Age of Onset  . Colon cancer Neg Hx   . Stomach cancer Neg Hx   . Esophageal cancer Neg Hx     Past Medical History:  Diagnosis Date  . Arthritis   . AV block 05/13 ecg   1st degree  . Bleeding    bladder,radiation cystitis-s/p cystoscopy, clot evacuation, fulgeration of bleeders  . Cancer ([place])    prostate, hx of bsal cell skin cancer   . Cataract   . Gout   . Hematuria   . Hemorrhoid   . History of basal cell carcinoma  excision 08-29-2011  left frontal scalp  . History of gout PER PT -  STABLE  . History of prostate cancer 2004   -  RX EXTERNAL RADIATION  . Hx of subdural hematoma 08-14-2011    s/p total left hip  12-92012---  resolved w/ no residual  . Hyperlipidemia   . Hypertension   . IFG (impaired fasting glucose)   . Lab findings of hypoxemia    abnormal ONO , dr perini. 05-26-2012  . Microalbuminuria 2011  . OA (osteoarthritis) of hip   . Obstructive apnea    AHI of 35 on 01-14-13 , titrated to 6 cm H20  . Pneumonia    hx of 2015   . Shoulder impingement syndrome    right  . Sleep apnea    cpap nightly  . Sleep apnea with use of continuous positive airway pressure (CPAP) 03/19/2013    Past Surgical History:  Procedure Laterality Date  . CARDIAC CATHETERIZATION     pt denies  . COLONOSCOPY    . CYSTOSCOPY WITH BIOPSY  07/03/2012   Procedure: CYSTOSCOPY WITH BIOPSY;  Surgeon: Franchot Gallo, MD;  Location: [facility];  Service: Urology;  Laterality: N/A;      . CYSTOSCOPY WITH FULGERATION N/A 12/02/2016   Procedure: CYSTOSCOPY WITH FULGERATION OF BLEEDERS AND TRANSURETRAL INCISION OF PROSTATE;  Surgeon: Franchot Gallo, MD;  Location: [facility];  Service: Urology;  Laterality: N/A;  . EYE SURGERY  2009   bilateral cataract surgery   . JOINT REPLACEMENT    . MOHS SURGERY    . PROSTATE SURGERY    . RIGHT ELBOW SURGERY  MAR 2013  . right elbow surgery  2009   ELBOW AND WRIST SURGERY  . TONSILLECTOMY     AGE 43  . TOTAL HIP ARTHROPLASTY  07/31/2011   Procedure: TOTAL HIP ARTHROPLASTY;  Surgeon: Dione Plover Aluisio;  Location: [facility];  Service: Orthopedics;  Laterality: Left;  . TOTAL HIP ARTHROPLASTY    . TRANSURETHRAL RESECTION OF PROSTATE  07/25/2012   Procedure: TRANSURETHRAL RESECTION OF THE PROSTATE WITH GYRUS INSTRUMENTS;  Surgeon: Alexis Frock, MD;  Location: [facility];  Service: Urology;  Laterality: N/A;  CYSTOSCOPY;CLOT EVACUATION WITH FULGERATION  . ULNAR NERVE  REPAIR  04/13    Social History:  reports that he has never smoked. He has never used smokeless tobacco. He reports that he drinks about 15.0 oz of alcohol per week. He reports that he does not use drugs.  Allergies:  Allergies  Allergen Reactions  . Allopurinol Hives and Other (See Comments)    Cause renal failure   . Ampicillin Diarrhea and Nausea And Vomiting    Facility-Administered Medications Prior to Admission  Medication Dose Route Frequency Provider Last Rate Last Dose  . 0.9 %  sodium chloride infusion  500 mL Intravenous Once Irene Shipper, MD       Medications Prior to Admission  Medication Sig Dispense Refill  . amLODipine (NORVASC) 2.5 MG tablet Take 2.5 mg by mouth every evening.     Marland Kitchen atorvastatin (LIPITOR) 40 MG tablet Take 40 mg by mouth daily.    . benazepril (LOTENSIN) 40 MG tablet Take 40 mg by mouth at bedtime.     . calcium-vitamin D (OSCAL WITH D) 500-200 MG-UNIT tablet Take 1 tablet by mouth.    . cetirizine (ZYRTEC) 10 MG tablet Take 10 mg by mouth daily.    . cholecalciferol (VITAMIN D) 1000 units tablet Take 1,000 Units by mouth daily.    . Coenzyme Q10 (COQ-10) 100 MG CAPS Take 1 Can by mouth as needed.     . Flaxseed, Linseed, (FLAXSEED OIL PO) Take 1,400 mg by mouth daily. Will stop prior to procedure    . fluticasone (FLONASE) 50 MCG/ACT nasal spray Place 2 sprays into the nose daily. ALLERGIES    . metoprolol succinate (TOPROL-XL) 25 MG 24 hr tablet Take 25 mg by mouth daily.    . Multiple Vitamins-Minerals (MULTIVITAMINS THER. W/MINERALS) TABS Take 1 tablet by mouth daily.     . TURMERIC PO Take 1,000 mg by mouth 2 (two) times daily.    . Febuxostat 80 MG TABS Take 80 mg by mouth daily.     Marland Kitchen omeprazole (PRILOSEC) 40 MG capsule Take 1 capsule (40 mg total) by mouth daily. 90 capsule 3     Physical Exam: Blood pressure 128/75, pulse (!) 102, temperature 98.6 F (37 C), temperature source Oral, resp. rate 19, height 5' 8"  (1.727 m), weight 72.6 kg  (160 lb), SpO2 97 %. General: pleasant, WD, WN, but ill-appearing white male who is laying in bed in NAD HEENT: head is normocephalic, atraumatic.  Sclera are noninjected, slightly  icteric.  PERRL.  Ears and nose without any masses or lesions.  Mouth is pink and dry Heart: irregular, slightly tachy.  Normal s1,s2. No obvious murmurs, gallops, or rubs noted.  Palpable radial and pedal pulses bilaterally Lungs: CTAB, no wheezes, rhonchi, or rales noted.  Respiratory effort nonlabored Abd: soft, tender greatest in RUQ and mildly in RLQ, ND, +BS, no masses, hernias, or organomegaly MS: all 4 extremities are symmetrical with no cyanosis, clubbing, or edema. Skin: warm and dry with no masses, lesions, or rashes Psych: A&Ox3 with an appropriate affect, but slow to respond to questions.   Results for orders placed or performed during the hospital encounter of 12/17/17 (from the past 48 hour(s))  CBC with Differential     Status: Abnormal   Collection Time: 12/17/17  3:21 PM  Result Value Ref Range   WBC 18.2 (H) 4.0 - 10.5 K/uL   RBC 3.85 (L) 4.22 - 5.81 MIL/uL   Hemoglobin 13.6 13.0 - 17.0 g/dL   HCT 37.7 (L) 39.0 - 52.0 %   MCV 97.9 78.0 - 100.0 fL   MCH 35.3 (H) 26.0 - 34.0 pg   MCHC 36.1 (H) 30.0 - 36.0 g/dL   RDW 14.8 11.5 - 15.5 %   Platelets 176 150 - 400 K/uL   Neutrophils Relative % 77 %   Neutro Abs 14.0 1.7 - 7.7 K/uL   Lymphocytes Relative 11 %   Lymphs Abs 2.0 0.7 - 4.0 K/uL   Monocytes Relative 12 %   Monocytes Absolute 2.2 0.1 - 1.0 K/uL   Eosinophils Relative 0 %   Eosinophils Absolute 0.0 0.0 - 0.7 K/uL   Basophils Relative 0 %   Basophils Absolute 0.0 0.0 - 0.1 K/uL    Comment: Performed at [facility], [address], [place], [place] [address]  Brain natriuretic peptide     Status: Abnormal   Collection Time: 12/17/17  3:45 PM  Result Value Ref Range   B Natriuretic Peptide 330.2 (H) 0.0 - 100.0 pg/mL    Comment: Performed at [facility]  [address] [place], [place] [address]  Ethanol     Status: None   Collection Time: 12/17/17  6:23 PM  Result Value Ref Range   Alcohol, Ethyl (B) <10 <10 mg/dL    Comment:        LOWEST DETECTABLE LIMIT FOR SERUM ALCOHOL IS 10 mg/dL FOR MEDICAL PURPOSES ONLY Performed at [facility], [place] [address], [place], [place] [address]   Comprehensive metabolic panel     Status: Abnormal   Collection Time: 12/17/17  6:23 PM  Result Value Ref Range   Sodium 134 (L) 135 - 145 mmol/L   Potassium 3.6 3.5 - 5.1 mmol/L   Chloride 98 (L) 101 - 111 mmol/L   CO2 22 22 - 32 mmol/L   Glucose, Bld 97 65 - 99 mg/dL   BUN 47 (H) 6 - 20 mg/dL   Creatinine, Ser 1.98 (H) 0.61 - 1.24 mg/dL   Calcium 8.4 (L) 8.9 - 10.3 mg/dL   Total Protein 5.7 (L) 6.5 - 8.1 g/dL   Albumin 2.6 (L) 3.5 - 5.0 g/dL   AST 105 (H) 15 - 41 U/L   ALT 64 (H) 17 - 63 U/L   Alkaline Phosphatase 196 (H) 38 - 126 U/L   Total Bilirubin 5.4 (H) 0.3 - 1.2 mg/dL   GFR calc non Af Amer 30 (L) >60 mL/min   GFR calc Af  Amer 34 (L) >60 mL/min    Comment: (NOTE) The eGFR has been calculated using the CKD EPI equation. This calculation has not been validated in all clinical situations. eGFR's persistently <60 mL/min signify possible Chronic Kidney Disease.    Anion gap 14 5 - 15    Comment: Performed at [facility] [address] [place], [place] [address]  Lipase, blood     Status: Abnormal   Collection Time: 12/17/17  6:23 PM  Result Value Ref Range   Lipase 121 (H) 11 - 51 U/L    Comment: Performed at [facility], [place] [address], [place], [place] [address]  Protime-INR     Status: Abnormal   Collection Time: 12/17/17  9:23 PM  Result Value Ref Range   Prothrombin Time 17.5 (H) 11.4 - 15.2 seconds   INR 1.45     Comment: Performed at [facility] [address] [place], [place] [address]  Troponin I (q 6hr x 3)     Status: Abnormal   Collection Time: 12/17/17  9:23 PM  Result Value Ref Range   Troponin I  0.10 (HH) <0.03 ng/mL    Comment: CRITICAL RESULT CALLED TO, READ BACK BY AND VERIFIED WITH: SANDERS A,RN 12/17/17 [id number] WAYK Performed at [facility], [place] [address] [place], [place] [address]   Procalcitonin     Status: None   Collection Time: 12/17/17  9:23 PM  Result Value Ref Range   Procalcitonin 2.24 ng/mL    Comment:        Interpretation: PCT > 2 ng/mL: Systemic infection (sepsis) is likely, unless other causes are known. (NOTE)       Sepsis PCT Algorithm           Lower Respiratory Tract                                      Infection PCT Algorithm    ----------------------------     ----------------------------         PCT < 0.25 ng/mL                PCT < 0.10 ng/mL         Strongly encourage             Strongly discourage   discontinuation of antibiotics    initiation of antibiotics    ----------------------------     -----------------------------       PCT 0.25 - 0.50 ng/mL            PCT 0.10 - 0.25 ng/mL               OR       >80% decrease in PCT            Discourage initiation of                                            antibiotics      Encourage discontinuation           of antibiotics    ----------------------------     -----------------------------         PCT >= 0.50 ng/mL              PCT 0.26 - 0.50  ng/mL               AND       <80% decrease in PCT              Encourage initiation of                                             antibiotics       Encourage continuation           of antibiotics    ----------------------------     -----------------------------        PCT >= 0.50 ng/mL                  PCT > 0.50 ng/mL               AND         increase in PCT                  Strongly encourage                                      initiation of antibiotics    Strongly encourage escalation           of antibiotics                                     -----------------------------                                           PCT <= 0.25 ng/mL                                                  OR                                        > 80% decrease in PCT                                     Discontinue / Do not initiate                                             antibiotics Performed at [facility], [address], [place], [place] [address]   APTT     Status: Abnormal   Collection Time: 12/17/17  9:23 PM  Result Value Ref Range   aPTT 38 (H) 24 - 36 seconds    Comment:        IF BASELINE aPTT IS ELEVATED, SUGGEST PATIENT RISK ASSESSMENT BE USED TO DETERMINE APPROPRIATE ANTICOAGULANT THERAPY. Performed at [facility], [place] [address], [place],  [address]  Lactic acid, plasma     Status: None   Collection Time: 12/17/17 11:06 PM  Result Value Ref Range   Lactic Acid, Venous 1.8 0.5 - 1.9 mmol/L    Comment: Performed at [facility] [address] [place], [place] [address]  Urinalysis, Routine w reflex microscopic     Status: Abnormal   Collection Time: 12/18/17 12:25 AM  Result Value Ref Range   Color, Urine AMBER (A) YELLOW    Comment: BIOCHEMICALS MAY BE AFFECTED BY COLOR   APPearance CLEAR CLEAR   Specific Gravity, Urine 1.015 1.005 - 1.030   pH 5.0 5.0 - 8.0   Glucose, UA NEGATIVE NEGATIVE mg/dL   Hgb urine dipstick NEGATIVE NEGATIVE   Bilirubin Urine NEGATIVE NEGATIVE   Ketones, ur NEGATIVE NEGATIVE mg/dL   Protein, ur 100 (A) NEGATIVE mg/dL   Nitrite NEGATIVE NEGATIVE   Leukocytes, UA NEGATIVE NEGATIVE   RBC / HPF 0-5 0 - 5 RBC/hpf   WBC, UA 0-5 0 - 5 WBC/hpf   Bacteria, UA NONE SEEN NONE SEEN   Squamous Epithelial / LPF 0-5 0 - 5    Comment: Please note change in reference range.   Mucus PRESENT    Hyaline Casts, UA PRESENT     Comment: Performed at [facility], [place] [address] [place], [place] [address]  Troponin I (q 6hr x 3)     Status: Abnormal   Collection Time: 12/18/17  6:51 AM  Result Value Ref Range   Troponin I 0.03 (HH) <0.03 ng/mL    Comment: CRITICAL VALUE  NOTED.  VALUE IS CONSISTENT WITH PREVIOUSLY REPORTED AND CALLED VALUE. Performed at [facility], [place] [address], [place], [place] [address]   Magnesium     Status: None   Collection Time: 12/18/17  6:51 AM  Result Value Ref Range   Magnesium 1.8 1.7 - 2.4 mg/dL    Comment: Performed at [facility] [address] [place], [place] [address]  Phosphorus     Status: None   Collection Time: 12/18/17  6:51 AM  Result Value Ref Range   Phosphorus 3.8 2.5 - 4.6 mg/dL    Comment: Performed at [facility] [address], [place], [place] [address]  TSH     Status: None   Collection Time: 12/18/17  6:51 AM  Result Value Ref Range   TSH 1.069 0.350 - 4.500 uIU/mL    Comment: Performed by a 3rd Generation assay with a functional sensitivity of <=0.01 uIU/mL. Performed at [facility], [place] [address], [place], [place] [address]   CBC     Status: Abnormal   Collection Time: 12/18/17  6:51 AM  Result Value Ref Range   WBC 21.5 (H) 4.0 - 10.5 K/uL   RBC 3.46 (L) 4.22 - 5.81 MIL/uL   Hemoglobin 12.1 (L) 13.0 - 17.0 g/dL   HCT 33.8 (L) 39.0 - 52.0 %   MCV 97.7 78.0 - 100.0 fL   MCH 35.0 (H) 26.0 - 34.0 pg   MCHC 35.8 30.0 - 36.0 g/dL   RDW 14.8 11.5 - 15.5 %   Platelets 160 150 - 400 K/uL    Comment: Performed at [facility], [place] [address], [place], [place] [address]  Protime-INR     Status: Abnormal   Collection Time: 12/18/17  6:51 AM  Result Value Ref Range   Prothrombin Time 20.0 (H) 11.4 - 15.2 seconds   INR 1.72  Comment: Performed at [facility], [place] [address], [place], [place] [address]  APTT     Status: Abnormal   Collection Time: 12/18/17  6:51 AM  Result Value Ref Range   aPTT 189 (HH) 24 - 36 seconds    Comment: REPEATED TO VERIFY IF BASELINE aPTT IS ELEVATED, SUGGEST PATIENT RISK ASSESSMENT BE USED TO DETERMINE APPROPRIATE ANTICOAGULANT THERAPY. CRITICAL RESULT CALLED TO, READ BACK BY AND VERIFIED WITH: Leighton Parody, RN [phone]  12/18/2017 BY MACEDA,J. Performed at [facility], [place] [address], [place], [place] [address]   Comprehensive metabolic panel     Status: Abnormal   Collection Time: 12/18/17  6:51 AM  Result Value Ref Range   Sodium 135 135 - 145 mmol/L   Potassium 3.2 (L) 3.5 - 5.1 mmol/L   Chloride 100 (L) 101 - 111 mmol/L   CO2 22 22 - 32 mmol/L   Glucose, Bld 77 65 - 99 mg/dL   BUN 43 (H) 6 - 20 mg/dL   Creatinine, Ser 1.78 (H) 0.61 - 1.24 mg/dL   Calcium 8.2 (L) 8.9 - 10.3 mg/dL   Total Protein 5.2 (L) 6.5 - 8.1 g/dL   Albumin 2.4 (L) 3.5 - 5.0 g/dL   AST 86 (H) 15 - 41 U/L   ALT 56 17 - 63 U/L   Alkaline Phosphatase 167 (H) 38 - 126 U/L   Total Bilirubin 5.4 (H) 0.3 - 1.2 mg/dL   GFR calc non Af Amer 34 (L) >60 mL/min   GFR calc Af Amer 39 (L) >60 mL/min    Comment: (NOTE) The eGFR has been calculated using the CKD EPI equation. This calculation has not been validated in all clinical situations. eGFR's persistently <60 mL/min signify possible Chronic Kidney Disease.    Anion gap 13 5 - 15    Comment: Performed at [facility] [address] [place], [place] [address]  Troponin I (q 6hr x 3)     Status: None   Collection Time: 12/18/17  9:35 AM  Result Value Ref Range   Troponin I <0.03 <0.03 ng/mL    Comment: Performed at [facility] [address] [place], [place] [address]  Lactic acid, plasma     Status: None   Collection Time: 12/18/17  9:35 AM  Result Value Ref Range   Lactic Acid, Venous 1.5 0.5 - 1.9 mmol/L    Comment: Performed at [facility] [address] [place], [place] [address]  Heparin level (unfractionated)     Status: None   Collection Time: 12/18/17  9:35 AM  Result Value Ref Range   Heparin Unfractionated 0.47 0.30 - 0.70 IU/mL    Comment:        IF HEPARIN RESULTS ARE BELOW EXPECTED VALUES, AND PATIENT DOSAGE HAS BEEN CONFIRMED, SUGGEST FOLLOW UP TESTING OF ANTITHROMBIN III LEVELS. Performed at [facility], [place] [address], [place], [place] [address]   CBC     Status: Abnormal   Collection Time: 12/18/17  9:35 AM  Result Value Ref Range   WBC 21.3 (H) 4.0 - 10.5 K/uL   RBC 3.58 (L) 4.22 - 5.81 MIL/uL   Hemoglobin 12.2 (L) 13.0 - 17.0 g/dL   HCT 34.8 (L) 39.0 - 52.0 %   MCV 97.2 78.0 - 100.0 fL   MCH 34.1 (H) 26.0 - 34.0 pg   MCHC 35.1 30.0 - 36.0 g/dL   RDW 14.7 11.5 - 15.5 %  Platelets 146 (L) 150 - 400 K/uL    Comment: Performed at [facility], [place] [address], [place], [place] [address]   Dg Chest 2 View  Result Date: 12/17/2017 CLINICAL DATA:  Sepsis EXAM: CHEST - 2 VIEW COMPARISON:  01/16/2014 FINDINGS: Mild interstitial edema. Heart size and mediastinal contours are stable with minimal aortic atherosclerosis. No pulmonary consolidation. No acute osseous abnormality. IMPRESSION: Mild interstitial edema. No acute pulmonary consolidation. Aortic atherosclerosis. Electronically Signed   By: Ashley Royalty M.D.   On: 12/17/2017 23:05   Mr Abdomen Mrcp Wo Contrast  Result Date: 12/18/2017 CLINICAL DATA:  Right upper quadrant pain and jaundice. Abnormal liver and gallbladder on ultrasound. EXAM: MRI ABDOMEN WITHOUT CONTRAST  (INCLUDING MRCP) TECHNIQUE: Multiplanar multisequence MR imaging of the abdomen was performed. Heavily T2-weighted images of the biliary and pancreatic ducts were obtained, and three-dimensional MRCP images were rendered by post processing. COMPARISON:  Ultrasound on 12/17/2017 FINDINGS: Lower chest: No acute findings. Hepatobiliary: Image degradation by motion artifact noted. Hepatic cirrhosis is demonstrated. No hepatic mass visualized on this unenhanced exam. The gallbladder is dilated and shows moderate diffuse wall thickening and pericholecystic edema. Sludge is also seen within the gallbladder lumen. These findings are greater than typically seen with cirrhosis, and acute cholecystitis cannot be excluded. There is no evidence of biliary ductal dilatation, with common bile duct  measuring approximately 5 mm. Pancreas: No mass or inflammatory process visualized on this unenhanced exam. No evidence of pancreatic ductal dilatation. Spleen:  Within normal limits in size. Adrenals/Urinary tract: Tiny right renal cyst noted. No evidence of hydronephrosis. Stomach/Bowel: Visualized portion unremarkable. Vascular/Lymphatic: No pathologically enlarged lymph nodes identified. No evidence of abdominal aortic aneurysm. Other:  Mild ascites. Musculoskeletal:  No suspicious bone lesions identified. IMPRESSION: Hepatic cirrhosis and mild ascites. Dilated gallbladder with sludge, moderate diffuse wall thickening and pericholecystic edema. These findings are greater than typically seen with cirrhosis, and acute cholecystitis cannot be excluded. If clinically warranted, nuclear medicine hepatobiliary scan could be performed for further evaluation. No evidence of biliary ductal dilatation. Electronically Signed   By: Earle Gell M.D.   On: 12/18/2017 07:32   Mr 3d Recon At Scanner  Result Date: 12/18/2017 CLINICAL DATA:  Right upper quadrant pain and jaundice. Abnormal liver and gallbladder on ultrasound. EXAM: MRI ABDOMEN WITHOUT CONTRAST  (INCLUDING MRCP) TECHNIQUE: Multiplanar multisequence MR imaging of the abdomen was performed. Heavily T2-weighted images of the biliary and pancreatic ducts were obtained, and three-dimensional MRCP images were rendered by post processing. COMPARISON:  Ultrasound on 12/17/2017 FINDINGS: Lower chest: No acute findings. Hepatobiliary: Image degradation by motion artifact noted. Hepatic cirrhosis is demonstrated. No hepatic mass visualized on this unenhanced exam. The gallbladder is dilated and shows moderate diffuse wall thickening and pericholecystic edema. Sludge is also seen within the gallbladder lumen. These findings are greater than typically seen with cirrhosis, and acute cholecystitis cannot be excluded. There is no evidence of biliary ductal dilatation, with  common bile duct measuring approximately 5 mm. Pancreas: No mass or inflammatory process visualized on this unenhanced exam. No evidence of pancreatic ductal dilatation. Spleen:  Within normal limits in size. Adrenals/Urinary tract: Tiny right renal cyst noted. No evidence of hydronephrosis. Stomach/Bowel: Visualized portion unremarkable. Vascular/Lymphatic: No pathologically enlarged lymph nodes identified. No evidence of abdominal aortic aneurysm. Other:  Mild ascites. Musculoskeletal:  No suspicious bone lesions identified. IMPRESSION: Hepatic cirrhosis and mild ascites. Dilated gallbladder with sludge, moderate diffuse wall thickening and pericholecystic edema. These findings are greater than typically  seen with cirrhosis, and acute cholecystitis cannot be excluded. If clinically warranted, nuclear medicine hepatobiliary scan could be performed for further evaluation. No evidence of biliary ductal dilatation. Electronically Signed   By: Earle Gell M.D.   On: 12/18/2017 07:32   US Abdomen Limited Ruq  Result Date: 12/17/2017 CLINICAL DATA:  Right upper quadrant abdominal pain and jaundice. EXAM: ULTRASOUND ABDOMEN LIMITED RIGHT UPPER QUADRANT COMPARISON:  None. FINDINGS: Gallbladder: Tumefactive biliary sludge is identified within the gallbladder. The single wall thickness of the gallbladder is 4.2 mm, normal less than 3 mm. No pericholecystic fluid. No sonographic Murphy sign noted by sonographer. Common bile duct: Diameter: 5 mm Liver: No focal lesion identified given limitations due to the patient's inability to adequately position for better images. Within normal limits in parenchymal echogenicity. Portal vein is patent on color Doppler imaging though indeterminate in direction of blood flow given mixed color signal noted within. Nodular liver surface compatible with cirrhosis. Adjacent small volume of ascites. IMPRESSION: 1. Thick-walled gallbladder with biliary sludge noted within. 2. Cirrhotic  appearing liver with adjacent small volume of perihepatic ascites. Mixed color Doppler signal within the imaged portal venous system may represent early changes of portal hypertension though the portal vein is patent. Electronically Signed   By: Ashley Royalty M.D.   On: 12/17/2017 19:55      Assessment/Plan Acute acalculous cholecystitis with new finding of cirrhosis The patient does appear to have cholecystitis in the setting of newfound cirrhosis.  He has an elevated WBC of 21K that is not consistent with just findings of cirrhosis.  However, because of his cirrhosis, his risk of complications with an operation are much higher.  In discussing this case with Dr. Redmond Pulling, we feel that a percutaneous cholecystostomy drain is the safest approach for him, especially given he doesn't have gallstones.  I have discussed with the patient and the daughter that generally these drains remain in place for 6-8 weeks and in his case, because he doesn't have stones, that it is a higher possibility that it can be removed at that time with a low rate of recurrent cholecystitis and need for surgical intervention, which would still be high risk for him due to his cirrhosis.  They understand.  He may have clears tonight and NPO p MN for hopeful procedure tomorrow per IR if amendable.   Henreitta Cea, [facility] 12/18/2017, 3:09 PM Pager: [phone]

## 2017-12-18 NOTE — Progress Notes (Signed)
PT Cancellation Note  Patient Details Name: STANDLEY BARGO MRN: [id number] DOB: 1933-06-22   Cancelled Treatment:    Reason Eval/Treat Not Completed: Patient at procedure or test/unavailable.  Medical staff in discussing pt's case with pt and unable to get in to evaluate.  Will try back today as able. 12/18/2017  Donnella Sham, [place] [phone]  (pager)   Tessie Fass Jullisa Grigoryan 12/18/2017, 3:09 PM

## 2017-12-18 NOTE — Progress Notes (Signed)
[facility] Progress Note  Patient: Lance Martin FKC:[id number]   PCP: Crist Infante, MD DOB: [birth year]   DOA: 12/17/2017   DOS: 12/18/2017   Date of Service: the patient was seen and examined on 12/18/2017  Subjective: Continues to have abdominal pain.  Occasional nausea.  No diarrhea no constipation.  No vomiting.  Brief hospital course: Pt. with PMH of HTN, HLD, gout, prostate cancer, hemorrhagic cystitis S/P fulguration, GERD; admitted on 12/17/2017, presented with complaint of abdominal pain, was found to have acute cholecystitis. Currently further plan is conservative management.  Assessment and Plan: 1.  Acute cholecystitis. Presented with abdominal pain, GI was consulted, MRCP was performed, CBD did not show any stone, there were also no gallstones.  Gallbladder was thickened. GI currently has signed off recommending no further work-up from their perspective. Recommend to continue IV antibiotics. General surgery was consulted by GI for cholecystitis, although surgery feels that the patient would be at high risk and therefore recommends percutaneous cholecystostomy tube placement which will be scheduled tomorrow. Continue IV antibiotics for now.  2.  Elevated troponin. Cardiology was consulted. Preoperative evaluation suggest patient will be at moderate risk for surgery. Elevated troponin likely demand ischemia and A. fib. No further work-up recommended.  3.  Sinus tachycardia. Likely in the response of infection. Initially thought to be A. fib on admission although it appears that PAC burden rather than active A. fib. Patient was started on anticoagulation. Discontinue heparin.  4. 5 essential hypertension. Blood pressure soft. Patient on 3 blood pressure medications at home, currently on hold.  Rate control with beta-blocker initiated.  5.  New diagnosis of cirrhosis. As per GI likely combination of alcohol induced and [place]. Outpatient follow-up recommended by GI. CPC  class C, MELD 24. Recommend alcohol cessation.  Diet: NPO DVT Prophylaxis: subcutaneous Heparin  Advance goals of care discussion: dnr dni  Family Communication: no family was present at bedside, at the time of interview.  Disposition:  Discharge to be determined.  Consultants: gastroenterology General surgery IR Procedures: none  Antibiotics: Anti-infectives (From admission, onward)   Start     Dose/Rate Route Frequency Ordered Stop   12/18/17 1000  ceFEPIme (MAXIPIME) 2 g in sodium chloride 0.9 % 100 mL IVPB     2 g 200 mL/hr over 30 Minutes Intravenous Every 12 hours 12/17/17 2220     12/18/17 0600  metroNIDAZOLE (FLAGYL) IVPB 500 mg     500 mg 100 mL/hr over 60 Minutes Intravenous Every 8 hours 12/17/17 2220     12/17/17 2215  ceFEPIme (MAXIPIME) 2 g in sodium chloride 0.9 % 100 mL IVPB     2 g 200 mL/hr over 30 Minutes Intravenous  Once 12/17/17 2203 12/18/17 0206   12/17/17 2200  ciprofloxacin (CIPRO) IVPB 400 mg  Status:  Discontinued     400 mg 200 mL/hr over 60 Minutes Intravenous  Once 12/17/17 2149 12/17/17 2203   12/17/17 2200  metroNIDAZOLE (FLAGYL) IVPB 500 mg     500 mg 100 mL/hr over 60 Minutes Intravenous  Once 12/17/17 2149 12/18/17 0206       Objective: Physical Exam: Vitals:   12/18/17 0600 12/18/17 0828 12/18/17 1230 12/18/17 1626  BP: (!) 121/58 (!) 103/54 128/75 (!) 107/56  Pulse: (!) 103 92 (!) 102 98  Resp: 19 20 19 19   Temp: 98.7 F (37.1 C) 99.2 F (37.3 C) 98.6 F (37 C) (!) 97.5 F (36.4 C)  TempSrc: Oral Oral Oral Oral  SpO2: 98% 96%  97% 98%  Weight:      Height:        Intake/Output Summary (Last 24 hours) at 12/18/2017 1811 Last data filed at 12/18/2017 1714 Gross per 24 hour  Intake -  Output 100 ml  Net -100 ml   Filed Weights   12/18/17 0321  Weight: 72.6 kg (160 lb)   General: Alert, Awake and Oriented to Time, Place and Person. Appear in mild distress, affect appropriate Eyes: PERRL, Conjunctiva normal ENT: Oral  Mucosa clear moist. Neck: no JVD, no Abnormal Mass Or lumps Cardiovascular: S1 and S2 Present, aortic systolic Murmur, Peripheral Pulses Present Respiratory: normal respiratory effort, Bilateral Air entry equal and Decreased, no use of accessory muscle, Clear to Auscultation, no Crackles, no wheezes Abdomen: Bowel Sound present, Soft and mild tenderness, no hernia Skin: no redness, no Rash, no induration Extremities: no Pedal edema, no calf tenderness Neurologic: Grossly no focal neuro deficit. Bilaterally Equal motor strength  Data Reviewed: CBC: Recent Labs  Lab 12/17/17 1521 12/18/17 0651 12/18/17 0935  WBC 18.2* 21.5* 21.3*  NEUTROABS 14.0  --   --   HGB 13.6 12.1* 12.2*  HCT 37.7* 33.8* 34.8*  MCV 97.9 97.7 97.2  PLT 176 160 761*   Basic Metabolic Panel: Recent Labs  Lab 12/17/17 1823 12/18/17 0651  NA 134* 135  K 3.6 3.2*  CL 98* 100*  CO2 22 22  GLUCOSE 97 77  BUN 47* 43*  CREATININE 1.98* 1.78*  CALCIUM 8.4* 8.2*  MG  --  1.8  PHOS  --  3.8    Liver Function Tests: Recent Labs  Lab 12/17/17 1823 12/18/17 0651  AST 105* 86*  ALT 64* 56  ALKPHOS 196* 167*  BILITOT 5.4* 5.4*  PROT 5.7* 5.2*  ALBUMIN 2.6* 2.4*   Recent Labs  Lab 12/17/17 1823  LIPASE 121*   No results for input(s): AMMONIA in the last 168 hours. Coagulation Profile: Recent Labs  Lab 12/17/17 2123 12/18/17 0651  INR 1.45 1.72   Cardiac Enzymes: Recent Labs  Lab 12/17/17 2123 12/18/17 0651 12/18/17 0935  TROPONINI 0.10* 0.03* <0.03   BNP (last 3 results) No results for input(s): PROBNP in the last 8760 hours. CBG: No results for input(s): GLUCAP in the last 168 hours. Studies: Dg Chest 2 View  Result Date: 12/17/2017 CLINICAL DATA:  Sepsis EXAM: CHEST - 2 VIEW COMPARISON:  01/16/2014 FINDINGS: Mild interstitial edema. Heart size and mediastinal contours are stable with minimal aortic atherosclerosis. No pulmonary consolidation. No acute osseous abnormality. IMPRESSION:  Mild interstitial edema. No acute pulmonary consolidation. Aortic atherosclerosis. Electronically Signed   By: Ashley Royalty M.D.   On: 12/17/2017 23:05   Mr Abdomen Mrcp Wo Contrast  Result Date: 12/18/2017 CLINICAL DATA:  Right upper quadrant pain and jaundice. Abnormal liver and gallbladder on ultrasound. EXAM: MRI ABDOMEN WITHOUT CONTRAST  (INCLUDING MRCP) TECHNIQUE: Multiplanar multisequence MR imaging of the abdomen was performed. Heavily T2-weighted images of the biliary and pancreatic ducts were obtained, and three-dimensional MRCP images were rendered by post processing. COMPARISON:  Ultrasound on 12/17/2017 FINDINGS: Lower chest: No acute findings. Hepatobiliary: Image degradation by motion artifact noted. Hepatic cirrhosis is demonstrated. No hepatic mass visualized on this unenhanced exam. The gallbladder is dilated and shows moderate diffuse wall thickening and pericholecystic edema. Sludge is also seen within the gallbladder lumen. These findings are greater than typically seen with cirrhosis, and acute cholecystitis cannot be excluded. There is no evidence of biliary ductal dilatation, with common bile duct measuring  approximately 5 mm. Pancreas: No mass or inflammatory process visualized on this unenhanced exam. No evidence of pancreatic ductal dilatation. Spleen:  Within normal limits in size. Adrenals/Urinary tract: Tiny right renal cyst noted. No evidence of hydronephrosis. Stomach/Bowel: Visualized portion unremarkable. Vascular/Lymphatic: No pathologically enlarged lymph nodes identified. No evidence of abdominal aortic aneurysm. Other:  Mild ascites. Musculoskeletal:  No suspicious bone lesions identified. IMPRESSION: Hepatic cirrhosis and mild ascites. Dilated gallbladder with sludge, moderate diffuse wall thickening and pericholecystic edema. These findings are greater than typically seen with cirrhosis, and acute cholecystitis cannot be excluded. If clinically warranted, nuclear medicine  hepatobiliary scan could be performed for further evaluation. No evidence of biliary ductal dilatation. Electronically Signed   By: Earle Gell M.D.   On: 12/18/2017 07:32   Mr 3d Recon At Scanner  Result Date: 12/18/2017 CLINICAL DATA:  Right upper quadrant pain and jaundice. Abnormal liver and gallbladder on ultrasound. EXAM: MRI ABDOMEN WITHOUT CONTRAST  (INCLUDING MRCP) TECHNIQUE: Multiplanar multisequence MR imaging of the abdomen was performed. Heavily T2-weighted images of the biliary and pancreatic ducts were obtained, and three-dimensional MRCP images were rendered by post processing. COMPARISON:  Ultrasound on 12/17/2017 FINDINGS: Lower chest: No acute findings. Hepatobiliary: Image degradation by motion artifact noted. Hepatic cirrhosis is demonstrated. No hepatic mass visualized on this unenhanced exam. The gallbladder is dilated and shows moderate diffuse wall thickening and pericholecystic edema. Sludge is also seen within the gallbladder lumen. These findings are greater than typically seen with cirrhosis, and acute cholecystitis cannot be excluded. There is no evidence of biliary ductal dilatation, with common bile duct measuring approximately 5 mm. Pancreas: No mass or inflammatory process visualized on this unenhanced exam. No evidence of pancreatic ductal dilatation. Spleen:  Within normal limits in size. Adrenals/Urinary tract: Tiny right renal cyst noted. No evidence of hydronephrosis. Stomach/Bowel: Visualized portion unremarkable. Vascular/Lymphatic: No pathologically enlarged lymph nodes identified. No evidence of abdominal aortic aneurysm. Other:  Mild ascites. Musculoskeletal:  No suspicious bone lesions identified. IMPRESSION: Hepatic cirrhosis and mild ascites. Dilated gallbladder with sludge, moderate diffuse wall thickening and pericholecystic edema. These findings are greater than typically seen with cirrhosis, and acute cholecystitis cannot be excluded. If clinically warranted,  nuclear medicine hepatobiliary scan could be performed for further evaluation. No evidence of biliary ductal dilatation. Electronically Signed   By: Earle Gell M.D.   On: 12/18/2017 07:32   US Abdomen Limited Ruq  Result Date: 12/17/2017 CLINICAL DATA:  Right upper quadrant abdominal pain and jaundice. EXAM: ULTRASOUND ABDOMEN LIMITED RIGHT UPPER QUADRANT COMPARISON:  None. FINDINGS: Gallbladder: Tumefactive biliary sludge is identified within the gallbladder. The single wall thickness of the gallbladder is 4.2 mm, normal less than 3 mm. No pericholecystic fluid. No sonographic Murphy sign noted by sonographer. Common bile duct: Diameter: 5 mm Liver: No focal lesion identified given limitations due to the patient's inability to adequately position for better images. Within normal limits in parenchymal echogenicity. Portal vein is patent on color Doppler imaging though indeterminate in direction of blood flow given mixed color signal noted within. Nodular liver surface compatible with cirrhosis. Adjacent small volume of ascites. IMPRESSION: 1. Thick-walled gallbladder with biliary sludge noted within. 2. Cirrhotic appearing liver with adjacent small volume of perihepatic ascites. Mixed color Doppler signal within the imaged portal venous system may represent early changes of portal hypertension though the portal vein is patent. Electronically Signed   By: Ashley Royalty M.D.   On: 12/17/2017 19:55    Scheduled Meds: Continuous Infusions: . sodium  chloride 75 mL/hr at 12/18/17 1115  . ceFEPime (MAXIPIME) IV Stopped (12/18/17 5183)  . metronidazole Stopped (12/18/17 1455)   PRN Meds: ondansetron **OR** ondansetron (ZOFRAN) IV  Time spent: 35 minutes  Author: Berle Mull, MD [facility] Pager: [phone] 12/18/2017 6:11 PM  If 7PM-7AM, please contact night-coverage at [email or web address], password [facility]

## 2017-12-18 NOTE — Progress Notes (Addendum)
ANTICOAGULATION CONSULT NOTE - Initial Consult  Pharmacy Consult for heparin Indication: atrial fibrillation  Allergies  Allergen Reactions  . Allopurinol Hives and Other (See Comments)    Cause renal failure   . Ampicillin Diarrhea and Nausea And Vomiting    Patient Measurements:   Heparin Dosing Weight: 72.6 Kg  Vital Signs: Temp: 99.8 F (37.7 C) (05/08 1812) Temp Source: Oral (05/08 1812) BP: 115/52 (05/09 0220) Pulse Rate: 105 (05/09 0220)  Labs: Recent Labs    12/17/17 1521 12/17/17 1823 12/17/17 2123  HGB 13.6  --   --   HCT 37.7*  --   --   PLT 176  --   --   APTT  --   --  38*  LABPROT  --   --  17.5*  INR  --   --  1.45  CREATININE  --  1.98*  --   TROPONINI  --   --  0.10*   CrCl cannot be calculated (Unknown ideal weight.).  Medical History: Past Medical History:  Diagnosis Date  . Arthritis   . AV block 05/13 ecg   1st degree  . Bleeding    bladder,radiation cystitis-s/p cystoscopy, clot evacuation, fulgeration of bleeders  . Cancer ([place])    prostate, hx of bsal cell skin cancer   . Cataract   . Gout   . Hematuria   . Hemorrhoid   . History of basal cell carcinoma excision 08-29-2011  left frontal scalp  . History of gout PER PT -  STABLE  . History of prostate cancer 2004   -  RX EXTERNAL RADIATION  . Hx of subdural hematoma 08-14-2011    s/p total left hip  12-92012---  resolved w/ no residual  . Hyperlipidemia   . Hypertension   . IFG (impaired fasting glucose)   . Lab findings of hypoxemia    abnormal ONO , dr perini. 05-26-2012  . Microalbuminuria 2011  . OA (osteoarthritis) of hip   . Obstructive apnea    AHI of 35 on 01-14-13 , titrated to 6 cm H20  . Pneumonia    hx of 2015   . Shoulder impingement syndrome    right  . Sleep apnea    cpap nightly  . Sleep apnea with use of continuous positive airway pressure (CPAP) 03/19/2013   Assessment: [name] with newly diagnosed afib presents to the ED with RUQ abdominal pain. No  anticoagulation reported PTA. CBC WNL. INR 1.45 Pharmacy consulted to start heparin infusion.   Goal of Therapy:  Heparin level 0.3-0.7 units/ml Monitor platelets by anticoagulation protocol: Yes   Plan:  Give 2000 units bolus x 1 (reduced bolus for INR) Start heparin infusion at 1100 units/hr Check anti-Xa level in 8 hours and daily while on heparin Continue to monitor H&H and platelets  Lance Martin 12/18/2017,3:18 AM

## 2017-12-18 NOTE — Consult Note (Signed)
Cardiology Consultation:   Patient ID: Lance Martin; [id number]; 1933-01-09   Admit date: 12/17/2017 Date of Consult: 12/18/2017  Primary Care Provider: Crist Infante, MD Primary Cardiologist: New to [facility], Dr. Radford Pax Primary Electrophysiologist:  None   Patient Profile:   Lance Martin is a 84 y.o. male with a PMH of of HTN, HLD, gout, prostate cancer s/p TURP and radiation, cystitis with bleeding s/p fulguration, and GERD who is being seen today for the evaluation of elevated troponins at the request of Dr. Posey Pronto.  History of Present Illness:   Lance Martin was in his usual state of health until 1 week ago when he had an episode of intractable vomiting. HE noted generalized abdominal pain following this episode which eventually localized to the RUQ and persisted since that time. He has had poor po intake and generalized weakness for the past week. His daughter noticed a yellow-ish tint to his skin color in the last couple days. He denied fevers, chest pain, SOB, DOE, LE edema, hematemesis or hematochezia. He was evaluated by his PCP a few days ago and recommended to get an abdominal US yesterday, however when it was time to leave for the appointment, he was too weak and in too much pain to move, prompting EMS activation. En route to the ED, EMS noted an abnormal heart rhythm on the monitor.   He denies prior personal or family history of heart disease. He denies ever having chest pain or an abnormal heart rhythm. He has never had an ischemic evaluation. Prior to this episode he was fairly active; able to perform ADLs without difficulty. He reports being able to walk 2 blocks or up 1-2 flights of stairs without anginal complaints.   Hospital course: afebrile, tachycardic, BP stable. Labs notable for K3.6>3.2, Cr 1.98>1.78, AST 105, ALT 64, Lipse 121, Tbili 5.4, WBC 18.2>21.5, Hgb 13.6, PLT 176, BNP 330.2, Troponin 0.10>0.03><0.03, procal 2.24, TSH wnl, CXR mild interstitial edema, also  aortic atherosclerosis noted. EKG with Sinus rhythm with 1st degree AV block and frequent PACs, RBBB, no STE/D. MRCP with hepatic cirrhosis and mild ascites, dilated gallbladder with sludge and moderate diffuse wall thickening and pericholecystic edema. GI evaluated patient recommending surgery consultation for possible CCY. Patient was started on IV antibiotics for acute cholecystitis/sepsis. Cardiology asked to evaluate for elevated troponins.   Past Medical History:  Diagnosis Date  . Arthritis   . AV block 05/13 ecg   1st degree  . Bleeding    bladder,radiation cystitis-s/p cystoscopy, clot evacuation, fulgeration of bleeders  . Cancer ([place])    prostate, hx of bsal cell skin cancer   . Cataract   . Gout   . Hematuria   . Hemorrhoid   . History of basal cell carcinoma excision 08-29-2011  left frontal scalp  . History of gout PER PT -  STABLE  . History of prostate cancer 2004   -  RX EXTERNAL RADIATION  . Hx of subdural hematoma 08-14-2011    s/p total left hip  12-92012---  resolved w/ no residual  . Hyperlipidemia   . Hypertension   . IFG (impaired fasting glucose)   . Lab findings of hypoxemia    abnormal ONO , dr perini. 05-26-2012  . Microalbuminuria 2011  . OA (osteoarthritis) of hip   . Obstructive apnea    AHI of 35 on 01-14-13 , titrated to 6 cm H20  . Pneumonia    hx of 2015   . Shoulder impingement syndrome  right  . Sleep apnea    cpap nightly  . Sleep apnea with use of continuous positive airway pressure (CPAP) 03/19/2013    Past Surgical History:  Procedure Laterality Date  . CARDIAC CATHETERIZATION     pt denies  . COLONOSCOPY    . CYSTOSCOPY WITH BIOPSY  07/03/2012   Procedure: CYSTOSCOPY WITH BIOPSY;  Surgeon: Franchot Gallo, MD;  Location: [facility];  Service: Urology;  Laterality: N/A;      . CYSTOSCOPY WITH FULGERATION N/A 12/02/2016   Procedure: CYSTOSCOPY WITH FULGERATION OF BLEEDERS AND TRANSURETRAL INCISION OF PROSTATE;   Surgeon: Franchot Gallo, MD;  Location: [facility];  Service: Urology;  Laterality: N/A;  . EYE SURGERY  2009   bilateral cataract surgery   . JOINT REPLACEMENT    . MOHS SURGERY    . PROSTATE SURGERY    . RIGHT ELBOW SURGERY  MAR 2013  . right elbow surgery  2009   ELBOW AND WRIST SURGERY  . TONSILLECTOMY     AGE 65  . TOTAL HIP ARTHROPLASTY  07/31/2011   Procedure: TOTAL HIP ARTHROPLASTY;  Surgeon: Dione Plover Aluisio;  Location: [facility];  Service: Orthopedics;  Laterality: Left;  . TOTAL HIP ARTHROPLASTY    . TRANSURETHRAL RESECTION OF PROSTATE  07/25/2012   Procedure: TRANSURETHRAL RESECTION OF THE PROSTATE WITH GYRUS INSTRUMENTS;  Surgeon: Alexis Frock, MD;  Location: [facility];  Service: Urology;  Laterality: N/A;  CYSTOSCOPY;CLOT EVACUATION WITH FULGERATION  . ULNAR NERVE REPAIR  04/13     Home Medications:  Prior to Admission medications   Medication Sig Start Date End Date Taking? Authorizing Provider  amLODipine (NORVASC) 2.5 MG tablet Take 2.5 mg by mouth every evening.  11/25/16  Yes [provider]  atorvastatin (LIPITOR) 40 MG tablet Take 40 mg by mouth daily.   Yes [provider]  benazepril (LOTENSIN) 40 MG tablet Take 40 mg by mouth at bedtime.    Yes [provider]  calcium-vitamin D (OSCAL WITH D) 500-200 MG-UNIT tablet Take 1 tablet by mouth.   Yes [provider]  cetirizine (ZYRTEC) 10 MG tablet Take 10 mg by mouth daily.   Yes [provider]  cholecalciferol (VITAMIN D) 1000 units tablet Take 1,000 Units by mouth daily.   Yes [provider]  Coenzyme Q10 (COQ-10) 100 MG CAPS Take 1 Can by mouth as needed.    Yes [provider]  Flaxseed, Linseed, (FLAXSEED OIL PO) Take 1,400 mg by mouth daily. Will stop prior to procedure   Yes [provider]  fluticasone (FLONASE) 50 MCG/ACT nasal spray Place 2 sprays into the nose daily. ALLERGIES   Yes [provider]  metoprolol succinate  (TOPROL-XL) 25 MG 24 hr tablet Take 25 mg by mouth daily.   Yes [provider]  Multiple Vitamins-Minerals (MULTIVITAMINS THER. W/MINERALS) TABS Take 1 tablet by mouth daily.    Yes [provider]  TURMERIC PO Take 1,000 mg by mouth 2 (two) times daily.   Yes [provider]  Febuxostat 80 MG TABS Take 80 mg by mouth daily.     [provider]  omeprazole (PRILOSEC) 40 MG capsule Take 1 capsule (40 mg total) by mouth daily. 09/04/17   Zehr, Laban Emperor, PA-C    Inpatient Medications: Scheduled Meds:  Continuous Infusions: . sodium chloride 75 mL/hr at 12/18/17 1115  . ceFEPime (MAXIPIME) IV Stopped (12/18/17 4970)  . heparin 1,100 Units/hr (12/18/17 0627)  . metronidazole 500 mg (12/18/17  1353)   PRN Meds: ondansetron **OR** ondansetron (ZOFRAN) IV  Allergies:    Allergies  Allergen Reactions  . Allopurinol Hives and Other (See Comments)    Cause renal failure   . Ampicillin Diarrhea and Nausea And Vomiting    Social History:   Social History   Socioeconomic History  . Marital status: Married    Spouse name: [name]  . Number of children: 2  . Years of education: Not on file  . Highest education level: Not on file  Occupational History  . Occupation: Paediatric nurse  Social Needs  . Financial resource strain: Not on file  . Food insecurity:    Worry: Not on file    Inability: Not on file  . Transportation needs:    Medical: Not on file    Non-medical: Not on file  Tobacco Use  . Smoking status: Never Smoker  . Smokeless tobacco: Never Used  Substance and Sexual Activity  . Alcohol use: Yes    Alcohol/week: 15.0 oz    Types: 4 Glasses of wine, 21 Shots of liquor per week    Comment: 4 drinks per day , all in whiskey category, 1/2 glass of wine at dinner   . Drug use: No  . Sexual activity: Not on file  Lifestyle  . Physical activity:    Days per week: Not on file    Minutes per session: Not on file  . Stress: Not on file    Relationships  . Social connections:    Talks on phone: Not on file    Gets together: Not on file    Attends religious service: Not on file    Active member of club or organization: Not on file    Attends meetings of clubs or organizations: Not on file    Relationship status: Not on file  . Intimate partner violence:    Fear of current or ex partner: Not on file    Emotionally abused: Not on file    Physically abused: Not on file    Forced sexual activity: Not on file  Other Topics Concern  . Not on file  Social History Narrative  . Not on file    Family History:    Family History  Problem Relation Age of Onset  . Colon cancer Neg Hx   . Stomach cancer Neg Hx   . Esophageal cancer Neg Hx      ROS:  Please see the history of present illness.   All other ROS reviewed and negative.     Physical Exam/Data:   Vitals:   12/18/17 0445 12/18/17 0600 12/18/17 0828 12/18/17 1230  BP: 120/65 (!) 121/58 (!) 103/54 128/75  Pulse:  (!) 103 92 (!) 102  Resp: (!) 22 19 20 19   Temp:  98.7 F (37.1 C) 99.2 F (37.3 C) 98.6 F (37 C)  TempSrc:  Oral Oral Oral  SpO2:  98% 96% 97%  Weight:      Height:       No intake or output data in the 24 hours ending 12/18/17 1423 Filed Weights   12/18/17 0321  Weight: 160 lb (72.6 kg)   Body mass index is 24.33 kg/m.  General:  Well nourished, well developed, elderly gentleman laying in bed in no acute distress HEENT: sclera mildly icteric Neck: no JVD Vascular: No carotid bruits; distal pulses 2+ bilaterally Cardiac:  normal S1, S2; mildly tachycardic, regular rhythm; no murmurs, gallops, or rubs Lungs:  clear to auscultation bilaterally, no  wheezing, rhonchi or rales  Abd: NABS, soft, nontender, no hepatomegaly Ext: no edema Musculoskeletal:  No deformities, BUE and BLE strength normal and equal Skin: warm and dry; mildly jaundiced  Neuro:  CNs 2-12 intact, no focal abnormalities noted Psych:  Normal affect   EKG:  The EKG was  personally reviewed and demonstrates:  Sinus tachycardia with 1st degree AV block and frequent PACs, RBBB, no STE/D. Telemetry:  Telemetry was personally reviewed and demonstrates:  Close review revealed no evidence of atrial fibrillation; sinus tachycardia with significantly prolonged 1st degree AV block and frequent PACs noted; brief episodes of atrial tachycardia observed  Relevant CV Studies:  Echocardiogram 12/18/17: Study Conclusions  - Left ventricle: The cavity size was normal. There was mild   concentric hypertrophy. Systolic function was vigorous. The   estimated ejection fraction was in the range of 65% to 70%. Wall   motion was normal; there were no regional wall motion   abnormalities. The study is not technically sufficient to allow   evaluation of LV diastolic function. - Aortic valve: Trileaflet; normal thickness, mildly calcified   leaflets. - Aorta: Aortic root dimension: 38 mm (ED). Ascending aortic   diameter: 38 mm (S). - Aortic root: The aortic root was mildly dilated. - Ascending aorta: The ascending aorta was mildly dilated. - Mitral valve: There was mild regurgitation. - Left atrium: The atrium was mildly dilated. - Pulmonic valve: There was trivial regurgitation.   Laboratory Data:  Chemistry Recent Labs  Lab 12/17/17 1823 12/18/17 0651  NA 134* 135  K 3.6 3.2*  CL 98* 100*  CO2 22 22  GLUCOSE 97 77  BUN 47* 43*  CREATININE 1.98* 1.78*  CALCIUM 8.4* 8.2*  GFRNONAA 30* 34*  GFRAA 34* 39*  ANIONGAP 14 13    Recent Labs  Lab 12/17/17 1823 12/18/17 0651  PROT 5.7* 5.2*  ALBUMIN 2.6* 2.4*  AST 105* 86*  ALT 64* 56  ALKPHOS 196* 167*  BILITOT 5.4* 5.4*   Hematology Recent Labs  Lab 12/17/17 1521 12/18/17 0651 12/18/17 0935  WBC 18.2* 21.5* 21.3*  RBC 3.85* 3.46* 3.58*  HGB 13.6 12.1* 12.2*  HCT 37.7* 33.8* 34.8*  MCV 97.9 97.7 97.2  MCH 35.3* 35.0* 34.1*  MCHC 36.1* 35.8 35.1  RDW 14.8 14.8 14.7  PLT 176 160 146*   Cardiac  Enzymes Recent Labs  Lab 12/17/17 2123 12/18/17 0651 12/18/17 0935  TROPONINI 0.10* 0.03* <0.03   No results for input(s): TROPIPOC in the last 168 hours.  BNP Recent Labs  Lab 12/17/17 1545  BNP 330.2*    DDimer No results for input(s): DDIMER in the last 168 hours.  Radiology/Studies:  Dg Chest 2 View  Result Date: 12/17/2017 CLINICAL DATA:  Sepsis EXAM: CHEST - 2 VIEW COMPARISON:  01/16/2014 FINDINGS: Mild interstitial edema. Heart size and mediastinal contours are stable with minimal aortic atherosclerosis. No pulmonary consolidation. No acute osseous abnormality. IMPRESSION: Mild interstitial edema. No acute pulmonary consolidation. Aortic atherosclerosis. Electronically Signed   By: Ashley Royalty M.D.   On: 12/17/2017 23:05   Mr Abdomen Mrcp Wo Contrast  Result Date: 12/18/2017 CLINICAL DATA:  Right upper quadrant pain and jaundice. Abnormal liver and gallbladder on ultrasound. EXAM: MRI ABDOMEN WITHOUT CONTRAST  (INCLUDING MRCP) TECHNIQUE: Multiplanar multisequence MR imaging of the abdomen was performed. Heavily T2-weighted images of the biliary and pancreatic ducts were obtained, and three-dimensional MRCP images were rendered by post processing. COMPARISON:  Ultrasound on 12/17/2017 FINDINGS: Lower chest: No acute findings. Hepatobiliary:  Image degradation by motion artifact noted. Hepatic cirrhosis is demonstrated. No hepatic mass visualized on this unenhanced exam. The gallbladder is dilated and shows moderate diffuse wall thickening and pericholecystic edema. Sludge is also seen within the gallbladder lumen. These findings are greater than typically seen with cirrhosis, and acute cholecystitis cannot be excluded. There is no evidence of biliary ductal dilatation, with common bile duct measuring approximately 5 mm. Pancreas: No mass or inflammatory process visualized on this unenhanced exam. No evidence of pancreatic ductal dilatation. Spleen:  Within normal limits in size.  Adrenals/Urinary tract: Tiny right renal cyst noted. No evidence of hydronephrosis. Stomach/Bowel: Visualized portion unremarkable. Vascular/Lymphatic: No pathologically enlarged lymph nodes identified. No evidence of abdominal aortic aneurysm. Other:  Mild ascites. Musculoskeletal:  No suspicious bone lesions identified. IMPRESSION: Hepatic cirrhosis and mild ascites. Dilated gallbladder with sludge, moderate diffuse wall thickening and pericholecystic edema. These findings are greater than typically seen with cirrhosis, and acute cholecystitis cannot be excluded. If clinically warranted, nuclear medicine hepatobiliary scan could be performed for further evaluation. No evidence of biliary ductal dilatation. Electronically Signed   By: Earle Gell M.D.   On: 12/18/2017 07:32   Mr 3d Recon At Scanner  Result Date: 12/18/2017 CLINICAL DATA:  Right upper quadrant pain and jaundice. Abnormal liver and gallbladder on ultrasound. EXAM: MRI ABDOMEN WITHOUT CONTRAST  (INCLUDING MRCP) TECHNIQUE: Multiplanar multisequence MR imaging of the abdomen was performed. Heavily T2-weighted images of the biliary and pancreatic ducts were obtained, and three-dimensional MRCP images were rendered by post processing. COMPARISON:  Ultrasound on 12/17/2017 FINDINGS: Lower chest: No acute findings. Hepatobiliary: Image degradation by motion artifact noted. Hepatic cirrhosis is demonstrated. No hepatic mass visualized on this unenhanced exam. The gallbladder is dilated and shows moderate diffuse wall thickening and pericholecystic edema. Sludge is also seen within the gallbladder lumen. These findings are greater than typically seen with cirrhosis, and acute cholecystitis cannot be excluded. There is no evidence of biliary ductal dilatation, with common bile duct measuring approximately 5 mm. Pancreas: No mass or inflammatory process visualized on this unenhanced exam. No evidence of pancreatic ductal dilatation. Spleen:  Within normal  limits in size. Adrenals/Urinary tract: Tiny right renal cyst noted. No evidence of hydronephrosis. Stomach/Bowel: Visualized portion unremarkable. Vascular/Lymphatic: No pathologically enlarged lymph nodes identified. No evidence of abdominal aortic aneurysm. Other:  Mild ascites. Musculoskeletal:  No suspicious bone lesions identified. IMPRESSION: Hepatic cirrhosis and mild ascites. Dilated gallbladder with sludge, moderate diffuse wall thickening and pericholecystic edema. These findings are greater than typically seen with cirrhosis, and acute cholecystitis cannot be excluded. If clinically warranted, nuclear medicine hepatobiliary scan could be performed for further evaluation. No evidence of biliary ductal dilatation. Electronically Signed   By: Earle Gell M.D.   On: 12/18/2017 07:32   US Abdomen Limited Ruq  Result Date: 12/17/2017 CLINICAL DATA:  Right upper quadrant abdominal pain and jaundice. EXAM: ULTRASOUND ABDOMEN LIMITED RIGHT UPPER QUADRANT COMPARISON:  None. FINDINGS: Gallbladder: Tumefactive biliary sludge is identified within the gallbladder. The single wall thickness of the gallbladder is 4.2 mm, normal less than 3 mm. No pericholecystic fluid. No sonographic Murphy sign noted by sonographer. Common bile duct: Diameter: 5 mm Liver: No focal lesion identified given limitations due to the patient's inability to adequately position for better images. Within normal limits in parenchymal echogenicity. Portal vein is patent on color Doppler imaging though indeterminate in direction of blood flow given mixed color signal noted within. Nodular liver surface compatible with cirrhosis. Adjacent small volume of  ascites. IMPRESSION: 1. Thick-walled gallbladder with biliary sludge noted within. 2. Cirrhotic appearing liver with adjacent small volume of perihepatic ascites. Mixed color Doppler signal within the imaged portal venous system may represent early changes of portal hypertension though the portal  vein is patent. Electronically Signed   By: Ashley Royalty M.D.   On: 12/17/2017 19:55    Assessment and Plan:   1. Elevated troponin: p/w RUQ abdominal pain and jaundice. Monitor en route to ED c/f atrial fibrillation, however on further review of EKG and telemetry revealed was sinus tachycardia with 1st degree AV block and frequent PACs. Trop trend 0.1>0.03><0.03. Echo with EF 65-70%, no wall motion abnormalities, and not sufficient to evaluate LV diastolic function. He denies chest pain or DOE.  - No need for further ischemic work up at this time  2. Sinus tachycardia with 1st degree AV block and frequent PACs: initially thought to be atrial fibrillation at the time of admission. Likely PAC burden increased in the setting of infection. On IV antibiotics and IVF. - Could consider restarting home metoprolol 25mg  daily now that BP improved.  - No indication for anticoagulation at this time. Will discontinue heparin  3. Acute Cholecystitis: p/w RUQ pain, jaundice, tachycardia, and hypotension. Admitted for sepsis. Leukocytosis, transaminitis, and elevated procalcitonin noted on labs. MRCP with cirrhosis and mild ascites, dilated gallbladder containing sludge with diffuse gallbladder wall thickening and pericholecystic edema. He was started on IV antibiotics. GI evaluated recommending surgical evaluation for possible CCY - Continue management per GI, primary team, and surgery team.  4. HTN: BP soft on presentation. Home amlodipine, benzapril, and metoprolol initially held. - Could consider restarting home metoprolol 25mg  daily now that BP improved and tachycardia persists.  5. HLD: - Continue statin  6. Cirrhosis: patient reportedly drinks 2-4 liquor drinks per night, and noted to be drinking more heavily over the past several months since the death of his wife. Imaging this admission c/f cirrhosis. GI following - Continue management per GI    Preoperative risk assessment: in anticipation of  upcoming cholecystectomy patient was evaluated for preoperative risk. He has no history of heart disease, CHF, DM, or stroke. Prior to this illness, he reports being able to walk 2 blocks and up 1-2 flights of stairs without anginal complaints.  - Based on the revised cardiac risk index for preoperative risk, he has a score of 2 points (intraabdominal surgery and borderline Cr >2) with a 10.1% 30-day risk of adverse cardiac events in the perioperative setting.  - No further ischemic work-up needed prior to surgery   For questions or updates, please contact [facility] Please consult [email or web address] for contact info under Cardiology/STEMI.   Signed, Abigail Butts, PA-C  12/18/2017 2:23 PM [phone]

## 2017-12-18 NOTE — Progress Notes (Signed)
7:15am critical ptt lab of 189 called to Dr. Posey Pronto no response.   9:30 Dr. Posey Pronto notified of critical ptt again, and patients potassium of 3.2, and no urine output with 618ml in bladder. No new orders.

## 2017-12-18 NOTE — ED Notes (Signed)
Pt was requesting other cultures be drawn with his other labs that will be drawn in the AM.

## 2017-12-18 NOTE — Progress Notes (Addendum)
OT Cancellation Note  Patient Details Name: Lance Martin MRN: [id number] DOB: 04-19-1933   Cancelled Treatment:    Reason Eval/Treat Not Completed: Patient at procedure or test/ unavailable.  Pt has been with multiple providers.  Will reattempt  [facility], OTR/L [phone]   Lucille Passy M 12/18/2017, 3:08 PM

## 2017-12-19 ENCOUNTER — Inpatient Hospital Stay (HOSPITAL_COMMUNITY): Payer: Medicare Other

## 2017-12-19 ENCOUNTER — Encounter (HOSPITAL_COMMUNITY): Payer: Self-pay | Admitting: Interventional Radiology

## 2017-12-19 DIAGNOSIS — I471 Supraventricular tachycardia: Secondary | ICD-10-CM

## 2017-12-19 DIAGNOSIS — R1011 Right upper quadrant pain: Secondary | ICD-10-CM

## 2017-12-19 DIAGNOSIS — R748 Abnormal levels of other serum enzymes: Secondary | ICD-10-CM

## 2017-12-19 DIAGNOSIS — K810 Acute cholecystitis: Secondary | ICD-10-CM

## 2017-12-19 DIAGNOSIS — K8309 Other cholangitis: Secondary | ICD-10-CM

## 2017-12-19 DIAGNOSIS — I10 Essential (primary) hypertension: Secondary | ICD-10-CM

## 2017-12-19 HISTORY — PX: IR PERC CHOLECYSTOSTOMY: IMG2326

## 2017-12-19 LAB — CBC WITH DIFFERENTIAL/PLATELET
Basophils Absolute: 0 10*3/uL (ref 0.0–0.1)
Basophils Relative: 0 %
EOS PCT: 0 %
Eosinophils Absolute: 0 10*3/uL (ref 0.0–0.7)
HCT: 31.7 % — ABNORMAL LOW (ref 39.0–52.0)
Hemoglobin: 11.4 g/dL — ABNORMAL LOW (ref 13.0–17.0)
LYMPHS ABS: 2.5 10*3/uL (ref 0.7–4.0)
Lymphocytes Relative: 14 %
MCH: 34.9 pg — AB (ref 26.0–34.0)
MCHC: 36 g/dL (ref 30.0–36.0)
MCV: 96.9 fL (ref 78.0–100.0)
MONO ABS: 1.1 10*3/uL — AB (ref 0.1–1.0)
MONOS PCT: 6 %
NEUTROS ABS: 14.3 10*3/uL — AB (ref 1.7–7.7)
Neutrophils Relative %: 80 %
PLATELETS: 161 10*3/uL (ref 150–400)
RBC: 3.27 MIL/uL — AB (ref 4.22–5.81)
RDW: 14.8 % (ref 11.5–15.5)
WBC: 17.9 10*3/uL — ABNORMAL HIGH (ref 4.0–10.5)

## 2017-12-19 LAB — PROTIME-INR
INR: 1.76
PROTHROMBIN TIME: 20.4 s — AB (ref 11.4–15.2)

## 2017-12-19 LAB — COMPREHENSIVE METABOLIC PANEL
ALT: 49 U/L (ref 17–63)
AST: 80 U/L — ABNORMAL HIGH (ref 15–41)
Albumin: 2.1 g/dL — ABNORMAL LOW (ref 3.5–5.0)
Alkaline Phosphatase: 183 U/L — ABNORMAL HIGH (ref 38–126)
Anion gap: 9 (ref 5–15)
BUN: 37 mg/dL — ABNORMAL HIGH (ref 6–20)
CHLORIDE: 105 mmol/L (ref 101–111)
CO2: 22 mmol/L (ref 22–32)
CREATININE: 1.58 mg/dL — AB (ref 0.61–1.24)
Calcium: 7.8 mg/dL — ABNORMAL LOW (ref 8.9–10.3)
GFR calc Af Amer: 45 mL/min — ABNORMAL LOW (ref >60)
GFR calc non Af Amer: 39 mL/min — ABNORMAL LOW (ref >60)
Glucose, Bld: 78 mg/dL (ref 65–99)
POTASSIUM: 3 mmol/L — AB (ref 3.5–5.1)
SODIUM: 136 mmol/L (ref 135–145)
Total Bilirubin: 4.5 mg/dL — ABNORMAL HIGH (ref 0.3–1.2)
Total Protein: 4.9 g/dL — ABNORMAL LOW (ref 6.5–8.1)

## 2017-12-19 LAB — AMMONIA: AMMONIA: 43 umol/L — AB (ref 9–35)

## 2017-12-19 LAB — MAGNESIUM: Magnesium: 1.8 mg/dL (ref 1.7–2.4)

## 2017-12-19 LAB — MRSA PCR SCREENING: MRSA BY PCR: NEGATIVE

## 2017-12-19 LAB — LACTIC ACID, PLASMA: LACTIC ACID, VENOUS: 1.5 mmol/L (ref 0.5–1.9)

## 2017-12-19 MED ORDER — METOPROLOL TARTRATE 5 MG/5ML IV SOLN
2.5000 mg | Freq: Four times a day (QID) | INTRAVENOUS | Status: DC
  Administered 2017-12-19 – 2017-12-21 (×8): 2.5 mg via INTRAVENOUS
  Filled 2017-12-19 (×8): qty 5

## 2017-12-19 MED ORDER — MIDAZOLAM HCL 2 MG/2ML IJ SOLN
INTRAMUSCULAR | Status: AC | PRN
  Administered 2017-12-19 (×2): 0.5 mg via INTRAVENOUS

## 2017-12-19 MED ORDER — LACTULOSE 10 GM/15ML PO SOLN
10.0000 g | Freq: Every day | ORAL | Status: DC
  Administered 2017-12-19 – 2017-12-23 (×4): 10 g via ORAL
  Filled 2017-12-19 (×4): qty 15

## 2017-12-19 MED ORDER — POTASSIUM CHLORIDE CRYS ER 20 MEQ PO TBCR
40.0000 meq | EXTENDED_RELEASE_TABLET | Freq: Once | ORAL | Status: DC
  Filled 2017-12-19: qty 2

## 2017-12-19 MED ORDER — IOPAMIDOL (ISOVUE-300) INJECTION 61%
INTRAVENOUS | Status: AC
  Administered 2017-12-19: 10 mL
  Filled 2017-12-19: qty 50

## 2017-12-19 MED ORDER — FENTANYL CITRATE (PF) 100 MCG/2ML IJ SOLN
INTRAMUSCULAR | Status: AC
  Filled 2017-12-19: qty 4

## 2017-12-19 MED ORDER — POTASSIUM CHLORIDE 10 MEQ/100ML IV SOLN
10.0000 meq | INTRAVENOUS | Status: AC
  Administered 2017-12-19 (×3): 10 meq via INTRAVENOUS
  Filled 2017-12-19 (×3): qty 100

## 2017-12-19 MED ORDER — LIDOCAINE HCL 1 % IJ SOLN
INTRAMUSCULAR | Status: AC | PRN
  Administered 2017-12-19: 10 mL

## 2017-12-19 MED ORDER — MIDAZOLAM HCL 2 MG/2ML IJ SOLN
INTRAMUSCULAR | Status: AC
Start: 2017-12-19 — End: 2017-12-20
  Filled 2017-12-19: qty 4

## 2017-12-19 MED ORDER — LIDOCAINE HCL 1 % IJ SOLN
INTRAMUSCULAR | Status: AC
  Filled 2017-12-19: qty 20

## 2017-12-19 MED ORDER — POTASSIUM CHLORIDE 20 MEQ/15ML (10%) PO SOLN
40.0000 meq | Freq: Every day | ORAL | Status: DC
  Administered 2017-12-19 – 2017-12-23 (×5): 40 meq via ORAL
  Filled 2017-12-19 (×5): qty 30

## 2017-12-19 MED ORDER — SODIUM CHLORIDE 0.9% FLUSH
5.0000 mL | Freq: Three times a day (TID) | INTRAVENOUS | Status: DC
  Administered 2017-12-19 – 2017-12-23 (×12): 5 mL

## 2017-12-19 MED ORDER — FENTANYL CITRATE (PF) 100 MCG/2ML IJ SOLN
INTRAMUSCULAR | Status: AC | PRN
  Administered 2017-12-19 (×2): 25 ug via INTRAVENOUS

## 2017-12-19 NOTE — Evaluation (Addendum)
Occupational Therapy Evaluation Patient Details Name: Lance Martin MRN: [id number] DOB: 11-30-1932 Today's Date: 12/19/2017    History of Present Illness This 84 y.o. male admitted with nausea, abdominal pain,  as well as generalized weakness.  He was found to have acute cholecystitis, and new diagnosis of cirrhosis.  PMH:  Gout, Rt shoulder impingement syndrome, h/o SDH, prostate CA, s/p Lt THA     Clinical Impression   Pt admitted with above. He demonstrates the below listed deficits and will benefit from continued OT to maximize safety and independence with BADLs.  Pt presents to OT with generalized weakness, impaired balance, decreased activity tolerance, and cognitive deficits including delayed processing, decreased initiation, and sequencing.   He currently requires min guard - max A for ADLs. He lives with son, who has limited ability to assist him at discharge, and reports, he was independent prior to onset of symptoms.  Will follow.       Follow Up Recommendations  Home health OT;Supervision/Assistance - 24 hour    Equipment Recommendations  None recommended by OT    Recommendations for Other Services       Precautions / Restrictions Precautions Precautions: Fall      Mobility Bed Mobility Overal bed mobility: Needs Assistance Bed Mobility: Rolling;Sidelying to Sit Rolling: Min assist Sidelying to sit: Mod assist       General bed mobility comments: step by cues for sequencing.  Assist to initiate moving LEs off bed and assist to lift trunk from bed   Transfers Overall transfer level: Needs assistance Equipment used: Rolling walker (2 wheeled) Transfers: Sit to/from [facility] Sit to Stand: Mod assist;+2 safety/equipment Stand pivot transfers: Mod assist;+2 safety/equipment       General transfer comment: assist to power up into standing, assist for balance, and assist to maneuver RW     Balance Overall balance assessment: Needs  assistance Sitting-balance support: Feet supported Sitting balance-Leahy Scale: Fair Sitting balance - Comments: static sitting with min guard assist EOB    Standing balance support: Bilateral upper extremity supported Standing balance-Leahy Scale: Poor Standing balance comment: requires bil. UE support and min A                            ADL either performed or assessed with clinical judgement   ADL Overall ADL's : Needs assistance/impaired Eating/Feeding: Set up;Sitting   Grooming: Wash/dry hands;Wash/dry face;Oral care;Brushing hair;Set up;Supervision/safety;Sitting   Upper Body Bathing: Minimal assistance;Sitting   Lower Body Bathing: Maximal assistance;Sit to/from stand   Upper Body Dressing : Minimal assistance;Sitting   Lower Body Dressing: Maximal assistance;Sit to/from stand Lower Body Dressing Details (indicate cue type and reason): difficulty accessing feet for LB ADLs  Toilet Transfer: Moderate assistance;+2 for physical assistance;+2 for safety/equipment;Ambulation;Comfort height toilet;BSC;RW Toilet Transfer Details (indicate cue type and reason): Pt requires assist to steady, assist to maneuver RW.  Pt with short, shuffling gait  Toileting- Clothing Manipulation and Hygiene: Maximal assistance;Sit to/from stand       Functional mobility during ADLs: Moderate assistance;+2 for physical assistance;+2 for safety/equipment;Rolling walker       Vision Baseline Vision/History: Wears glasses Wears Glasses: At all times       Perception     Praxis Praxis Praxis tested?: Deficits Deficits: Initiation;Ideomotor    Pertinent Vitals/Pain Pain Assessment: No/denies pain     Hand Dominance Right   Extremity/Trunk Assessment Upper Extremity Assessment Upper Extremity Assessment: RUE deficits/detail;LUE deficits/detail RUE Deficits /  Details: Bil. UEs tremulous, which pt reports is new.  Rt shoulder weakness noted.  He has h/o shoulder impingement  syndrome  RUE Coordination: decreased fine motor;decreased gross motor LUE Deficits / Details: Bil. UEs tremulous, which pt reports is new.  LUE Coordination: decreased fine motor;decreased gross motor   Lower Extremity Assessment Lower Extremity Assessment: Defer to PT evaluation       Communication Communication Communication: Expressive difficulties;HOH(low volume )   Cognition Arousal/Alertness: Awake/alert Behavior During Therapy: Flat affect Overall Cognitive Status: Impaired/Different from baseline Area of Impairment: Attention;Following commands;Problem solving                   Current Attention Level: Selective   Following Commands: Follows one step commands consistently;Follows one step commands inconsistently     Problem Solving: Slow processing;Decreased initiation;Difficulty sequencing;Requires verbal cues;Requires tactile cues General Comments: Pt has hearing loss, making it a bit difficult to accurately assess cognition.  He is slow to initiate movement, and demonstrates difficulty with sequencing activity    General Comments  daughter present at end of session     Exercises     Shoulder Instructions      Home Living Family/patient expects to be discharged to:: Private residence Living Arrangements: Children Available Help at Discharge: Family;Available PRN/intermittently Type of Home: House Home Access: Level entry     Home Layout: One level     Bathroom Shower/Tub: Teacher, early years/pre: Handicapped height     Home Equipment: Environmental consultant - 2 wheels;Bedside commode;Shower seat - built in;Grab bars - toilet;Wheelchair - manual;Cane - single point   Additional Comments: son, whom pt lives with has Bipolar syndrome, and is not fully reliable, per daughter      Prior Functioning/Environment Level of Independence: Independent;Independent with assistive device(s)        Comments: Pt ambulated with SPC, and Pt reports prior to onset  of nausea 2 weeks ago, he was going to the "[facility]" 4 days/week         OT Problem List: Decreased strength;Decreased activity tolerance;Impaired balance (sitting and/or standing);Decreased cognition;Decreased coordination;Decreased safety awareness;Decreased knowledge of precautions;Decreased knowledge of use of DME or AE      OT Treatment/Interventions: Self-care/ADL training;Neuromuscular education;DME and/or AE instruction;Therapeutic activities;Cognitive remediation/compensation;Patient/family education;Balance training    OT Goals(Current goals can be found in the care plan section) Acute Rehab OT Goals Patient Stated Goal: to regain independence  OT Goal Formulation: With patient Time For Goal Achievement: 01/02/18 Potential to Achieve Goals: Good ADL Goals Pt Will Perform Grooming: with min guard assist;standing Pt Will Perform Upper Body Bathing: with min guard assist;sitting Pt Will Perform Lower Body Bathing: with min guard assist;sit to/from stand Pt Will Perform Upper Body Dressing: with min guard assist;sitting Pt Will Perform Lower Body Dressing: with min guard assist;sit to/from stand Pt Will Transfer to Toilet: with min guard assist;ambulating;regular height toilet;bedside commode;grab bars Pt Will Perform Toileting - Clothing Manipulation and hygiene: with min guard assist;sit to/from stand  OT Frequency: Min 2X/week   Barriers to D/C: Decreased caregiver support          Co-evaluation PT/OT/SLP Co-Evaluation/Treatment: Yes Reason for Co-Treatment: For patient/therapist safety;To address functional/ADL transfers   OT goals addressed during session: ADL's and self-care      AM-PAC PT "6 Clicks" Daily Activity     Outcome Measure Help from another person eating meals?: A Little Help from another person taking care of personal grooming?: A Little Help from another person toileting, which includes using  toliet, bedpan, or urinal?: A Lot Help from another person  bathing (including washing, rinsing, drying)?: A Lot Help from another person to put on and taking off regular upper body clothing?: A Little Help from another person to put on and taking off regular lower body clothing?: A Lot 6 Click Score: 15   End of Session Equipment Utilized During Treatment: Rolling walker;Gait belt Nurse Communication: Mobility status  Activity Tolerance: Patient limited by fatigue Patient left: in chair;with call bell/phone within reach;with family/visitor present  OT Visit Diagnosis: Unsteadiness on feet (R26.81)                Time: 1142-1200 OT Time Calculation (min): 18 min Charges:  OT General Charges $OT Visit: 1 Visit OT Evaluation $OT Eval Moderate Complexity: 1 Mod G-Codes:     [facility], OTR/L [phone]   Lucille Passy M 12/19/2017, 12:38 PM

## 2017-12-19 NOTE — Procedures (Signed)
Interventional Radiology Procedure Note  Procedure: Transhepatic percutaneous cholecystostomy tube placement.  60 mL purulent, foul smelling bile.  Sample sent for culture.   Complications: None  Estimated Blood Loss: None  Recommendations: - Drain to bag - Flush Q shift - Cultures are pending   Signed,  Criselda Peaches, MD

## 2017-12-19 NOTE — Progress Notes (Signed)
CRITICAL VALUE ALERT  Critical Value:  Potassium- 3.0  Date & Time Notied:  12/19/2017 @ 0451  Provider Notified: [facility] pager

## 2017-12-19 NOTE — Progress Notes (Signed)
[facility] Progress Note  Patient: Lance Martin YDX:[id number]   PCP: Crist Infante, MD DOB: 07-Mar-1933   DOA: 12/17/2017   DOS: 12/19/2017   Date of Service: the patient was seen and examined on 12/19/2017  Subjective: Attempted to examine the patient multiple times.  No acute events overnight.  Tolerated procedure very well.  Brief hospital course: Pt. with PMH of HTN, HLD, gout, prostate cancer, hemorrhagic cystitis S/P fulguration, GERD; admitted on 12/17/2017, presented with complaint of abdominal pain, was found to have acute cholecystitis. Currently further plan is conservative management.  Assessment and Plan: 1.  Acute cholecystitis. Presented with abdominal pain, GI was consulted, MRCP was performed, CBD did not show any stone, there were also no gallstones.  Gallbladder was thickened. GI currently has signed off recommending no further work-up from their perspective. Recommend to continue IV antibiotics. General surgery was consulted by GI for cholecystitis, although surgery feels that the patient would be at high risk and therefore recommends percutaneous cholecystostomy tube placement, tolerated well. Continue IV antibiotics for now.  2.  Elevated troponin. Cardiology was consulted. Preoperative evaluation suggest patient will be at moderate risk for surgery. Elevated troponin likely demand ischemia and A. fib. No further work-up recommended.  3.  Sinus tachycardia. Likely in the response of infection. Initially thought to be A. fib on admission although it appears that PAC burden rather than active A. fib. Patient was started on anticoagulation. Discontinue heparin.  4. Essential hypertension. Blood pressure soft. Patient on 3 blood pressure medications at home, currently on hold.  Rate control with beta-blocker initiated.  5.  New diagnosis of cirrhosis. As per GI likely combination of alcohol induced and [place]. Outpatient follow-up recommended by GI. CPC class  C, MELD 24. Recommend alcohol cessation.  Diet: NPO DVT Prophylaxis: subcutaneous Heparin  Advance goals of care discussion: dnr dni  Family Communication: no family was present at bedside, at the time of interview.  Disposition:  Discharge to be determined.  Consultants: gastroenterology General surgery IR Procedures: none  Antibiotics: Anti-infectives (From admission, onward)   Start     Dose/Rate Route Frequency Ordered Stop   12/18/17 1000  ceFEPIme (MAXIPIME) 2 g in sodium chloride 0.9 % 100 mL IVPB     2 g 200 mL/hr over 30 Minutes Intravenous Every 12 hours 12/17/17 2220     12/18/17 0600  metroNIDAZOLE (FLAGYL) IVPB 500 mg     500 mg 100 mL/hr over 60 Minutes Intravenous Every 8 hours 12/17/17 2220     12/17/17 2215  ceFEPIme (MAXIPIME) 2 g in sodium chloride 0.9 % 100 mL IVPB     2 g 200 mL/hr over 30 Minutes Intravenous  Once 12/17/17 2203 12/18/17 0206   12/17/17 2200  ciprofloxacin (CIPRO) IVPB 400 mg  Status:  Discontinued     400 mg 200 mL/hr over 60 Minutes Intravenous  Once 12/17/17 2149 12/17/17 2203   12/17/17 2200  metroNIDAZOLE (FLAGYL) IVPB 500 mg     500 mg 100 mL/hr over 60 Minutes Intravenous  Once 12/17/17 2149 12/18/17 0206       Objective: Physical Exam: Vitals:   12/19/17 1435 12/19/17 1440 12/19/17 1445 12/19/17 1450  BP: 118/63 118/68 114/61 123/68  Pulse: 94 94 93 92  Resp: 18 18 20  (!) 21  Temp:      TempSrc:      SpO2: 100% 100% 100% 100%  Weight:      Height:        Intake/Output Summary (Last  24 hours) at 12/19/2017 1658 Last data filed at 12/19/2017 1315 Gross per 24 hour  Intake 630 ml  Output 1100 ml  Net -470 ml   Filed Weights   12/18/17 0321  Weight: 72.6 kg (160 lb)   General: Alert, Awake and Oriented to Time, Place and Person. Appear in mild distress, affect appropriate Eyes: PERRL, Conjunctiva normal ENT: Oral Mucosa clear moist. Neck: no JVD, no Abnormal Mass Or lumps Cardiovascular: S1 and S2 Present, aortic  systolic Murmur, Peripheral Pulses Present Respiratory: normal respiratory effort, Bilateral Air entry equal and Decreased, no use of accessory muscle, Clear to Auscultation, no Crackles, no wheezes Abdomen: Bowel Sound present, Soft and mild tenderness, no hernia Skin: no redness, no Rash, no induration Extremities: no Pedal edema, no calf tenderness Neurologic: Grossly no focal neuro deficit. Bilaterally Equal motor strength  Data Reviewed: CBC: Recent Labs  Lab 12/17/17 1521 12/18/17 0651 12/18/17 0935 12/19/17 0308  WBC 18.2* 21.5* 21.3* 17.9*  NEUTROABS 14.0  --   --  14.3*  HGB 13.6 12.1* 12.2* 11.4*  HCT 37.7* 33.8* 34.8* 31.7*  MCV 97.9 97.7 97.2 96.9  PLT 176 160 146* 161   Basic Metabolic Panel: Recent Labs  Lab 12/17/17 1823 12/18/17 0651 12/19/17 0308  NA 134* 135 136  K 3.6 3.2* 3.0*  CL 98* 100* 105  CO2 22 22 22   GLUCOSE 97 77 78  BUN 47* 43* 37*  CREATININE 1.98* 1.78* 1.58*  CALCIUM 8.4* 8.2* 7.8*  MG  --  1.8 1.8  PHOS  --  3.8  --     Liver Function Tests: Recent Labs  Lab 12/17/17 1823 12/18/17 0651 12/19/17 0308  AST 105* 86* 80*  ALT 64* 56 49  ALKPHOS 196* 167* 183*  BILITOT 5.4* 5.4* 4.5*  PROT 5.7* 5.2* 4.9*  ALBUMIN 2.6* 2.4* 2.1*   Recent Labs  Lab 12/17/17 1823  LIPASE 121*   Recent Labs  Lab 12/19/17 0308  AMMONIA 43*   Coagulation Profile: Recent Labs  Lab 12/17/17 2123 12/18/17 0651 12/19/17 0647  INR 1.45 1.72 1.76   Cardiac Enzymes: Recent Labs  Lab 12/17/17 2123 12/18/17 0651 12/18/17 0935  TROPONINI 0.10* 0.03* <0.03   BNP (last 3 results) No results for input(s): PROBNP in the last 8760 hours. CBG: No results for input(s): GLUCAP in the last 168 hours. Studies: Ir Perc Cholecystostomy  Result Date: 12/19/2017 INDICATION: 84 year old male with acute cholecystitis (sludge, no definite stones). He is currently a poor operative candidate in the setting of hepatic cirrhosis. He presents for  percutaneous cholecystostomy tube placement. EXAM: CHOLECYSTOSTOMY MEDICATIONS: In patient currently receiving intravenous cefepime and Flagyl. No additional antibiotic prophylaxis administered. ANESTHESIA/SEDATION: Moderate (conscious) sedation was employed during this procedure. A total of Versed 1 mg and Fentanyl 50 mcg was administered intravenously. Moderate Sedation Time: 13 minutes. The patient's level of consciousness and vital signs were monitored continuously by radiology nursing throughout the procedure under my direct supervision. FLUOROSCOPY TIME:  Fluoroscopy Time: 1 minutes 0 seconds (21 mGy). COMPLICATIONS: None immediate. PROCEDURE: Informed written consent was obtained from the patient after a thorough discussion of the procedural risks, benefits and alternatives. All questions were addressed. Maximal Sterile Barrier Technique was utilized including caps, mask, sterile gowns, sterile gloves, sterile drape, hand hygiene and skin antiseptic. A timeout was performed prior to the initiation of the procedure. The right upper quadrant was interrogated with ultrasound. The right hemidiaphragm is elevated. The liver sits high in the right upper quadrant. The distended  and inflamed gallbladder containing sludge was successfully identified. There is a small volume of perihepatic ascites. A suitable skin entry site was selected and marked. Local anesthesia was attained by infiltration with 1% lidocaine. A small dermatotomy was made. Under real-time sonographic guidance, the gallbladder lumen was punctured with a 21 gauge Accustick needle along a short transhepatic course. The 0.018 wire was then coiled in the gallbladder lumen. The needle was exchanged over the wire for the Accustick sheath which was advanced into the gallbladder. A gentle hand injection of contrast material confirms the Accustick sheath is within the gallbladder lumen. There is extensive debris within the lumen and cobblestoning of the  mucosa. A Bentson wire was then coiled in the gallbladder lumen. The Accustick sheath was removed. The percutaneous tract was dilated to 10 [place] and a Cook 10.2 [place] all-purpose drainage catheter was advanced over the wire and formed in the gallbladder lumen. Aspiration was performed yielding approximately 60 mL of thick, purulent bile. A sample was sent for Gram stain and culture. The cholecystostomy tube was gently flushed and then connected to gravity bag drainage. The catheter was secured to the skin with 0 Prolene suture. The patient tolerated the procedure well. IMPRESSION: Successful placement of a percutaneous transhepatic cholecystostomy tube for the treatment of acute cholecystitis (sludge, no definite stones on imaging). PLAN: 1. Maintain tube to gravity bag drainage. 2. Given patient's underlying cirrhosis, he is unlikely to be common optimal surgical candidate in the future. 3. IR drain clinic in 4 weeks for cholangiogram through existing tube. If the cystic duct regains patency, the percutaneous drainage catheter could be removed after 6 weeks given that there is no definitive evidence of cholelithiasis. Signed, Criselda Peaches, MD [facility] Electronically Signed   By: Jacqulynn Cadet M.D.   On: 12/19/2017 16:13    Scheduled Meds: . fentaNYL      . lactulose  10 g Oral Daily  . lidocaine      . metoprolol tartrate  2.5 mg Intravenous Q6H  . midazolam      . potassium chloride  40 mEq Oral Daily  . sodium chloride flush  5 mL Intracatheter Q8H   Continuous Infusions: . sodium chloride 75 mL/hr at 12/18/17 2101  . ceFEPime (MAXIPIME) IV Stopped (12/19/17 1005)  . metronidazole 500 mg (12/19/17 1323)   PRN Meds: acetaminophen, morphine injection, ondansetron **OR** ondansetron (ZOFRAN) IV, traMADol  Time spent: 8minutes  Author: Berle Mull, MD [facility] Pager: [phone] 12/19/2017 4:58 PM  If  7PM-7AM, please contact night-coverage at [email or web address], password [facility]

## 2017-12-19 NOTE — Progress Notes (Signed)
Patient ID: Lance Martin, male   DOB: 1932-09-06, 84 y.o.   MRN: [id number]       Subjective: Pt has no new complaints today.  Still tender in RUQ  Objective: Vital signs in last 24 hours: Temp:  [97.5 F (36.4 C)-99.4 F (37.4 C)] 97.6 F (36.4 C) (05/10 0820) Pulse Rate:  [98-109] 99 (05/10 0820) Resp:  [19-26] 26 (05/09 2309) BP: (97-128)/(41-75) 118/58 (05/10 0820) SpO2:  [92 %-98 %] 95 % (05/10 0820) Last BM Date: (PTA)  Intake/Output from previous day: 05/09 0701 - 05/10 0700 In: 1305 [P.O.:480; I.V.:525; IV Piggyback:300] Out: 550 [Urine:550] Intake/Output this shift: Total I/O In: -  Out: 350 [Urine:350]  PE: Heart: regular Lungs: CTAB Abd: soft, tender in RUQ, +BS, ND Skin: jaundice  Lab Results:  Recent Labs    12/18/17 0935 12/19/17 0308  WBC 21.3* 17.9*  HGB 12.2* 11.4*  HCT 34.8* 31.7*  PLT 146* 161   BMET Recent Labs    12/18/17 0651 12/19/17 0308  NA 135 136  K 3.2* 3.0*  CL 100* 105  CO2 22 22  GLUCOSE 77 78  BUN 43* 37*  CREATININE 1.78* 1.58*  CALCIUM 8.2* 7.8*   PT/INR Recent Labs    12/18/17 0651 12/19/17 0647  LABPROT 20.0* 20.4*  INR 1.72 1.76   CMP     Component Value Date/Time   NA 136 12/19/2017 0308   K 3.0 (L) 12/19/2017 0308   CL 105 12/19/2017 0308   CO2 22 12/19/2017 0308   GLUCOSE 78 12/19/2017 0308   BUN 37 (H) 12/19/2017 0308   CREATININE 1.58 (H) 12/19/2017 0308   CALCIUM 7.8 (L) 12/19/2017 0308   PROT 4.9 (L) 12/19/2017 0308   ALBUMIN 2.1 (L) 12/19/2017 0308   AST 80 (H) 12/19/2017 0308   ALT 49 12/19/2017 0308   ALKPHOS 183 (H) 12/19/2017 0308   BILITOT 4.5 (H) 12/19/2017 0308   GFRNONAA 39 (L) 12/19/2017 0308   GFRAA 45 (L) 12/19/2017 0308   Lipase     Component Value Date/Time   LIPASE 121 (H) 12/17/2017 1823       Studies/Results: Dg Chest 2 View  Result Date: 12/17/2017 CLINICAL DATA:  Sepsis EXAM: CHEST - 2 VIEW COMPARISON:  01/16/2014 FINDINGS: Mild interstitial edema. Heart size  and mediastinal contours are stable with minimal aortic atherosclerosis. No pulmonary consolidation. No acute osseous abnormality. IMPRESSION: Mild interstitial edema. No acute pulmonary consolidation. Aortic atherosclerosis. Electronically Signed   By: Ashley Royalty M.D.   On: 12/17/2017 23:05   Mr Abdomen Mrcp Wo Contrast  Result Date: 12/18/2017 CLINICAL DATA:  Right upper quadrant pain and jaundice. Abnormal liver and gallbladder on ultrasound. EXAM: MRI ABDOMEN WITHOUT CONTRAST  (INCLUDING MRCP) TECHNIQUE: Multiplanar multisequence MR imaging of the abdomen was performed. Heavily T2-weighted images of the biliary and pancreatic ducts were obtained, and three-dimensional MRCP images were rendered by post processing. COMPARISON:  Ultrasound on 12/17/2017 FINDINGS: Lower chest: No acute findings. Hepatobiliary: Image degradation by motion artifact noted. Hepatic cirrhosis is demonstrated. No hepatic mass visualized on this unenhanced exam. The gallbladder is dilated and shows moderate diffuse wall thickening and pericholecystic edema. Sludge is also seen within the gallbladder lumen. These findings are greater than typically seen with cirrhosis, and acute cholecystitis cannot be excluded. There is no evidence of biliary ductal dilatation, with common bile duct measuring approximately 5 mm. Pancreas: No mass or inflammatory process visualized on this unenhanced exam. No evidence of pancreatic ductal dilatation. Spleen:  Within  normal limits in size. Adrenals/Urinary tract: Tiny right renal cyst noted. No evidence of hydronephrosis. Stomach/Bowel: Visualized portion unremarkable. Vascular/Lymphatic: No pathologically enlarged lymph nodes identified. No evidence of abdominal aortic aneurysm. Other:  Mild ascites. Musculoskeletal:  No suspicious bone lesions identified. IMPRESSION: Hepatic cirrhosis and mild ascites. Dilated gallbladder with sludge, moderate diffuse wall thickening and pericholecystic edema. These  findings are greater than typically seen with cirrhosis, and acute cholecystitis cannot be excluded. If clinically warranted, nuclear medicine hepatobiliary scan could be performed for further evaluation. No evidence of biliary ductal dilatation. Electronically Signed   By: Earle Gell M.D.   On: 12/18/2017 07:32   Mr 3d Recon At Scanner  Result Date: 12/18/2017 CLINICAL DATA:  Right upper quadrant pain and jaundice. Abnormal liver and gallbladder on ultrasound. EXAM: MRI ABDOMEN WITHOUT CONTRAST  (INCLUDING MRCP) TECHNIQUE: Multiplanar multisequence MR imaging of the abdomen was performed. Heavily T2-weighted images of the biliary and pancreatic ducts were obtained, and three-dimensional MRCP images were rendered by post processing. COMPARISON:  Ultrasound on 12/17/2017 FINDINGS: Lower chest: No acute findings. Hepatobiliary: Image degradation by motion artifact noted. Hepatic cirrhosis is demonstrated. No hepatic mass visualized on this unenhanced exam. The gallbladder is dilated and shows moderate diffuse wall thickening and pericholecystic edema. Sludge is also seen within the gallbladder lumen. These findings are greater than typically seen with cirrhosis, and acute cholecystitis cannot be excluded. There is no evidence of biliary ductal dilatation, with common bile duct measuring approximately 5 mm. Pancreas: No mass or inflammatory process visualized on this unenhanced exam. No evidence of pancreatic ductal dilatation. Spleen:  Within normal limits in size. Adrenals/Urinary tract: Tiny right renal cyst noted. No evidence of hydronephrosis. Stomach/Bowel: Visualized portion unremarkable. Vascular/Lymphatic: No pathologically enlarged lymph nodes identified. No evidence of abdominal aortic aneurysm. Other:  Mild ascites. Musculoskeletal:  No suspicious bone lesions identified. IMPRESSION: Hepatic cirrhosis and mild ascites. Dilated gallbladder with sludge, moderate diffuse wall thickening and pericholecystic  edema. These findings are greater than typically seen with cirrhosis, and acute cholecystitis cannot be excluded. If clinically warranted, nuclear medicine hepatobiliary scan could be performed for further evaluation. No evidence of biliary ductal dilatation. Electronically Signed   By: Earle Gell M.D.   On: 12/18/2017 07:32   US Abdomen Limited Ruq  Result Date: 12/17/2017 CLINICAL DATA:  Right upper quadrant abdominal pain and jaundice. EXAM: ULTRASOUND ABDOMEN LIMITED RIGHT UPPER QUADRANT COMPARISON:  None. FINDINGS: Gallbladder: Tumefactive biliary sludge is identified within the gallbladder. The single wall thickness of the gallbladder is 4.2 mm, normal less than 3 mm. No pericholecystic fluid. No sonographic Murphy sign noted by sonographer. Common bile duct: Diameter: 5 mm Liver: No focal lesion identified given limitations due to the patient's inability to adequately position for better images. Within normal limits in parenchymal echogenicity. Portal vein is patent on color Doppler imaging though indeterminate in direction of blood flow given mixed color signal noted within. Nodular liver surface compatible with cirrhosis. Adjacent small volume of ascites. IMPRESSION: 1. Thick-walled gallbladder with biliary sludge noted within. 2. Cirrhotic appearing liver with adjacent small volume of perihepatic ascites. Mixed color Doppler signal within the imaged portal venous system may represent early changes of portal hypertension though the portal vein is patent. Electronically Signed   By: Ashley Royalty M.D.   On: 12/17/2017 19:55    Anti-infectives: Anti-infectives (From admission, onward)   Start     Dose/Rate Route Frequency Ordered Stop   12/18/17 1000  ceFEPIme (MAXIPIME) 2 g in sodium chloride  0.9 % 100 mL IVPB     2 g 200 mL/hr over 30 Minutes Intravenous Every 12 hours 12/17/17 2220     12/18/17 0600  metroNIDAZOLE (FLAGYL) IVPB 500 mg     500 mg 100 mL/hr over 60 Minutes Intravenous Every 8  hours 12/17/17 2220     12/17/17 2215  ceFEPIme (MAXIPIME) 2 g in sodium chloride 0.9 % 100 mL IVPB     2 g 200 mL/hr over 30 Minutes Intravenous  Once 12/17/17 2203 12/18/17 0206   12/17/17 2200  ciprofloxacin (CIPRO) IVPB 400 mg  Status:  Discontinued     400 mg 200 mL/hr over 60 Minutes Intravenous  Once 12/17/17 2149 12/17/17 2203   12/17/17 2200  metroNIDAZOLE (FLAGYL) IVPB 500 mg     500 mg 100 mL/hr over 60 Minutes Intravenous  Once 12/17/17 2149 12/18/17 0206       Assessment/Plan Acute acalculous cholecystitis with new finding of cirrhosis -IR has seen patient.  Plan for perc chole drain when schedule allows. -cont abx therapy, maxipime and flagyl.  WBC down to 17K today from 21K. -may have clear liquids AFTER drain placement -cirrhosis, per GI  New onset a fib Per medicine and cardiology     FEN - NPO for procedure VTE - SCDs/heparin gtt DC  ID - maxipime/flagyl   LOS: 2 days    Henreitta Cea , [facility] 12/19/2017, 9:41 AM Pager: [phone]

## 2017-12-19 NOTE — Progress Notes (Signed)
18:00 Patient transferred to [place] in stable condition with all belongings and family at bedside. Dr. Posey Pronto notified of lateral transfer.

## 2017-12-19 NOTE — Progress Notes (Signed)
Progress Note  Patient Name: Lance Martin Date of Encounter: 12/19/2017  Primary Cardiologist: No primary care provider on file.   Subjective   CCM feels patient is not a good surgical candidate and interventional radiology has been consulted for percutaneous drainage placement.  He denies any chest pain or shortness of breath.  He did have several runs of SVT overnight likely atrial tachycardia.  Inpatient Medications    Scheduled Meds:  Continuous Infusions: . sodium chloride 75 mL/hr at 12/18/17 2101  . ceFEPime (MAXIPIME) IV Stopped (12/18/17 2151)  . metronidazole Stopped (12/19/17 7673)   PRN Meds: acetaminophen, morphine injection, ondansetron **OR** ondansetron (ZOFRAN) IV, traMADol   Vital Signs    Vitals:   12/18/17 1946 12/18/17 2309 12/19/17 0417 12/19/17 0820  BP: (!) 110/58 120/68 (!) 97/41 (!) 118/58  Pulse: (!) 104 (!) 109 99 99  Resp: (!) 23 (!) 26    Temp: 99.4 F (37.4 C) 98 F (36.7 C) 98.4 F (36.9 C) 97.6 F (36.4 C)  TempSrc: Oral Oral Oral Oral  SpO2: 92% 96% 94% 95%  Weight:      Height:        Intake/Output Summary (Last 24 hours) at 12/19/2017 0902 Last data filed at 12/19/2017 4193 Gross per 24 hour  Intake 1305 ml  Output 900 ml  Net 405 ml   Filed Weights   12/18/17 0321  Weight: 160 lb (72.6 kg)    Telemetry    Sinus rhythm, sinus tachycardia, occasional PVCs and PACs.  There were several long runs of SVT consistent with atrial tachycardia overnight- Personally Reviewed  ECG    No new EKG noted.- Personally Reviewed  Physical Exam   GEN: No acute distress.   Neck: No JVD Cardiac: RRR, no murmurs, rubs, or gallops.  Respiratory: Clear to auscultation bilaterally. GI: Soft, nontender, non-distended  MS: No edema; No deformity. Neuro:  Nonfocal  Psych: Normal affect   Labs    Chemistry Recent Labs  Lab 12/17/17 1823 12/18/17 0651 12/19/17 0308  NA 134* 135 136  K 3.6 3.2* 3.0*  CL 98* 100* 105  CO2 22 22  22   GLUCOSE 97 77 78  BUN 47* 43* 37*  CREATININE 1.98* 1.78* 1.58*  CALCIUM 8.4* 8.2* 7.8*  PROT 5.7* 5.2* 4.9*  ALBUMIN 2.6* 2.4* 2.1*  AST 105* 86* 80*  ALT 64* 56 49  ALKPHOS 196* 167* 183*  BILITOT 5.4* 5.4* 4.5*  GFRNONAA 30* 34* 39*  GFRAA 34* 39* 45*  ANIONGAP 14 13 9      Hematology Recent Labs  Lab 12/18/17 0651 12/18/17 0935 12/19/17 0308  WBC 21.5* 21.3* 17.9*  RBC 3.46* 3.58* 3.27*  HGB 12.1* 12.2* 11.4*  HCT 33.8* 34.8* 31.7*  MCV 97.7 97.2 96.9  MCH 35.0* 34.1* 34.9*  MCHC 35.8 35.1 36.0  RDW 14.8 14.7 14.8  PLT 160 146* 161    Cardiac Enzymes Recent Labs  Lab 12/17/17 2123 12/18/17 0651 12/18/17 0935  TROPONINI 0.10* 0.03* <0.03   No results for input(s): TROPIPOC in the last 168 hours.   BNP Recent Labs  Lab 12/17/17 1545  BNP 330.2*     DDimer No results for input(s): DDIMER in the last 168 hours.   Radiology    Dg Chest 2 View  Result Date: 12/17/2017 CLINICAL DATA:  Sepsis EXAM: CHEST - 2 VIEW COMPARISON:  01/16/2014 FINDINGS: Mild interstitial edema. Heart size and mediastinal contours are stable with minimal aortic atherosclerosis. No pulmonary consolidation. No acute osseous  abnormality. IMPRESSION: Mild interstitial edema. No acute pulmonary consolidation. Aortic atherosclerosis. Electronically Signed   By: Ashley Royalty M.D.   On: 12/17/2017 23:05   Mr Abdomen Mrcp Wo Contrast  Result Date: 12/18/2017 CLINICAL DATA:  Right upper quadrant pain and jaundice. Abnormal liver and gallbladder on ultrasound. EXAM: MRI ABDOMEN WITHOUT CONTRAST  (INCLUDING MRCP) TECHNIQUE: Multiplanar multisequence MR imaging of the abdomen was performed. Heavily T2-weighted images of the biliary and pancreatic ducts were obtained, and three-dimensional MRCP images were rendered by post processing. COMPARISON:  Ultrasound on 12/17/2017 FINDINGS: Lower chest: No acute findings. Hepatobiliary: Image degradation by motion artifact noted. Hepatic cirrhosis is  demonstrated. No hepatic mass visualized on this unenhanced exam. The gallbladder is dilated and shows moderate diffuse wall thickening and pericholecystic edema. Sludge is also seen within the gallbladder lumen. These findings are greater than typically seen with cirrhosis, and acute cholecystitis cannot be excluded. There is no evidence of biliary ductal dilatation, with common bile duct measuring approximately 5 mm. Pancreas: No mass or inflammatory process visualized on this unenhanced exam. No evidence of pancreatic ductal dilatation. Spleen:  Within normal limits in size. Adrenals/Urinary tract: Tiny right renal cyst noted. No evidence of hydronephrosis. Stomach/Bowel: Visualized portion unremarkable. Vascular/Lymphatic: No pathologically enlarged lymph nodes identified. No evidence of abdominal aortic aneurysm. Other:  Mild ascites. Musculoskeletal:  No suspicious bone lesions identified. IMPRESSION: Hepatic cirrhosis and mild ascites. Dilated gallbladder with sludge, moderate diffuse wall thickening and pericholecystic edema. These findings are greater than typically seen with cirrhosis, and acute cholecystitis cannot be excluded. If clinically warranted, nuclear medicine hepatobiliary scan could be performed for further evaluation. No evidence of biliary ductal dilatation. Electronically Signed   By: Earle Gell M.D.   On: 12/18/2017 07:32   Mr 3d Recon At Scanner  Result Date: 12/18/2017 CLINICAL DATA:  Right upper quadrant pain and jaundice. Abnormal liver and gallbladder on ultrasound. EXAM: MRI ABDOMEN WITHOUT CONTRAST  (INCLUDING MRCP) TECHNIQUE: Multiplanar multisequence MR imaging of the abdomen was performed. Heavily T2-weighted images of the biliary and pancreatic ducts were obtained, and three-dimensional MRCP images were rendered by post processing. COMPARISON:  Ultrasound on 12/17/2017 FINDINGS: Lower chest: No acute findings. Hepatobiliary: Image degradation by motion artifact noted. Hepatic  cirrhosis is demonstrated. No hepatic mass visualized on this unenhanced exam. The gallbladder is dilated and shows moderate diffuse wall thickening and pericholecystic edema. Sludge is also seen within the gallbladder lumen. These findings are greater than typically seen with cirrhosis, and acute cholecystitis cannot be excluded. There is no evidence of biliary ductal dilatation, with common bile duct measuring approximately 5 mm. Pancreas: No mass or inflammatory process visualized on this unenhanced exam. No evidence of pancreatic ductal dilatation. Spleen:  Within normal limits in size. Adrenals/Urinary tract: Tiny right renal cyst noted. No evidence of hydronephrosis. Stomach/Bowel: Visualized portion unremarkable. Vascular/Lymphatic: No pathologically enlarged lymph nodes identified. No evidence of abdominal aortic aneurysm. Other:  Mild ascites. Musculoskeletal:  No suspicious bone lesions identified. IMPRESSION: Hepatic cirrhosis and mild ascites. Dilated gallbladder with sludge, moderate diffuse wall thickening and pericholecystic edema. These findings are greater than typically seen with cirrhosis, and acute cholecystitis cannot be excluded. If clinically warranted, nuclear medicine hepatobiliary scan could be performed for further evaluation. No evidence of biliary ductal dilatation. Electronically Signed   By: Earle Gell M.D.   On: 12/18/2017 07:32   US Abdomen Limited Ruq  Result Date: 12/17/2017 CLINICAL DATA:  Right upper quadrant abdominal pain and jaundice. EXAM: ULTRASOUND ABDOMEN LIMITED RIGHT  UPPER QUADRANT COMPARISON:  None. FINDINGS: Gallbladder: Tumefactive biliary sludge is identified within the gallbladder. The single wall thickness of the gallbladder is 4.2 mm, normal less than 3 mm. No pericholecystic fluid. No sonographic Murphy sign noted by sonographer. Common bile duct: Diameter: 5 mm Liver: No focal lesion identified given limitations due to the patient's inability to adequately  position for better images. Within normal limits in parenchymal echogenicity. Portal vein is patent on color Doppler imaging though indeterminate in direction of blood flow given mixed color signal noted within. Nodular liver surface compatible with cirrhosis. Adjacent small volume of ascites. IMPRESSION: 1. Thick-walled gallbladder with biliary sludge noted within. 2. Cirrhotic appearing liver with adjacent small volume of perihepatic ascites. Mixed color Doppler signal within the imaged portal venous system may represent early changes of portal hypertension though the portal vein is patent. Electronically Signed   By: Ashley Royalty M.D.   On: 12/17/2017 19:55    Cardiac Studies   Echocardiogram 12/18/17: Study Conclusions  - Left ventricle: The cavity size was normal. There was mild concentric hypertrophy. Systolic function was vigorous. The estimated ejection fraction was in the range of 65% to 70%. Wall motion was normal; there were no regional wall motion abnormalities. The study is not technically sufficient to allow evaluation of LV diastolic function. - Aortic valve: Trileaflet; normal thickness, mildly calcified leaflets. - Aorta: Aortic root dimension: 38 mm (ED). Ascending aortic diameter: 38 mm (S). - Aortic root: The aortic root was mildly dilated. - Ascending aorta: The ascending aorta was mildly dilated. - Mitral valve: There was mild regurgitation. - Left atrium: The atrium was mildly dilated. - Pulmonic valve: There was trivial regurgitation.  Patient Profile     84 y.o. male  with a history of hypertension, hyperlipidemia and prostate CA status post TURP and radiation but no prior history of cardiac disease.  1 week ago he started developing abdominal pain associated with decreased p.o. intake, weakness and severe nausea.  His daughter noticed that he had a yellow tent to his skin color and went to see his PCP.  He was supposed to go to have an abdominal  ultrasound but due to severe weakness and too much abdominal pain he could not make it and called EMS.  In route to the ER he was noted to have an abnormal rhythm on the heart monitor that EMS thought was atrial fibrillation.  He was ultimately diagnosed with acute cholecystitis with sepsis.   Assessment & Plan    1.  Acute acalculous cholecystitis -Admitted with right upper quadrant abdominal pain, jaundice, tachycardia and hypotension consistent with sepsis. -LFTs/procalcitonin elevated  -MRCP showed cirrhosis with mild ascites, dilated gallbladder with sludge and gallbladder wall thickening with pericholecystic edema consistent with acute cholecystitis -IV antibiotics per GI and surgery -CCS feels patient not a surgical candidate due to new onset cirrhosis -IR has been consulted for placement of percutaneous cholecystotomy drain which is planned for today  2.  Preop cardiac clearance -He has no history of coronary disease and denies any anginal symptoms.  He states that prior to getting sick last week he was able to walk at least 4 blocks or 2 flights of stairs without any chest pain or shortness of breath. -His revised cardiac risk score is 10.1% 30-day risk of adverse cardiac events in the perioperative setting.  This is mainly due to high risk surgical procedure in combination with borderline creatinine. -His only cardiac risk factors are advanced age, hypertension and hyperlipidemia. -Since  he can achieve at least 4 METS of exercise without any anginal symptoms no further ischemic work-up is necessary prior to surgery.  3.  Tachycardia -Telemetry in route to the ER by EMS was felt to be atrial fibrillation with RVR -In review of all EKGs including telemetry there is no evidence of atrial fibrillation.  He has a long first-degree AV block that at sometimes is hidden in the T wave of the prior ST segment.  There are clear-cut P waves noted intermittently on all EKGs and telemetry.   Underlying rhythm is consistent with sinus tachycardia with prolonged first-degree AV block with frequent PACs and nonsustained atrial tachycardia. -He is at risk for increased atrial arrhythmias in the setting of acute illness so would continue to monitor on telemetry as he may have problems with postoperative atrial fibrillation due to high catecholamine state. -Heart rate likely elevated due to underlying infection -Currently off his home dose of Toprol-XL 25 mg daily due to n.p.o. -Telemetry shows sinus rhythm to sinus tachycardia with long first-degree AV block and occasional PACs and PVCs -He did have several long runs of SVT which likely was atrial tachycardia last night.  -Will start Lopressor 2.5 mg IV every 6 hours and titrate as needed and as blood pressure tolerates  5.  Elevated troponin -If you have labs troponin trend is 0.1> 0.03> 0.03 which are really not elevated. -2D echocardiogram showed hyperdynamic LV function with EF 65 to 70% with no wall motion abnormalities.  Due to frequent PACs and tachycardia diastolic function could not be determined. -No further ischemic work-up needed at this time.  4.  Hypertension -Blood pressure is well controlled at 120/75 mmHg.  5.  Hypokalemia -Replete per TRH to keep K+>4 to avoid further arrhythmias  For questions or updates, please contact [place] Please consult [email or web address] for contact info under Cardiology/STEMI.      Signed, Fransico Him, MD  12/19/2017, 9:02 AM

## 2017-12-19 NOTE — Care Management Note (Signed)
Case Management Note  Patient Details  Name: Lance Martin MRN: [id number] Date of Birth: 1933-03-21  Subjective/Objective:  84 y.o. male admitted with nausea, abdominal pain,  as well as generalized weakness.  He was found to have acute cholecystitis, and new diagnosis of cirrhosis.  PTA, pt resided at home with son.                    Action/Plan: PT/OT recommending HH follow up; feel he is good candidate for [facility].  Pt meets criteria for this program, which would allow pt to have RN, PT, OT and [facility] aide with family support at dc.  Referral has been made to [facility] with Alvis Lemmings, who will evaluate pt.    Expected Discharge Date:                  Expected Discharge Plan:  [place]  In-House Referral:     Discharge planning Services  CM Consult  Post Acute Care Choice:    Choice offered to:     DME Arranged:    DME Agency:     HH Arranged:    [place] Agency:     Status of Service:  In process, will continue to follow  If discussed at Long Length of Stay Meetings, dates discussed:    Additional Comments:  Reinaldo Raddle, RN, BSN  Trauma/Neuro ICU Case Manager [phone]

## 2017-12-19 NOTE — Evaluation (Signed)
Physical Therapy Evaluation Patient Details Name: Lance Martin MRN: [id number] DOB: [birth year] Today's Date: 12/19/2017   History of Present Illness  This 84 y.o. male admitted with nausea, abdominal pain,  as well as generalized weakness.  He was found to have acute cholecystitis, and new diagnosis of cirrhosis.  PMH:  Gout, Rt shoulder impingement syndrome, h/o SDH, prostate CA, s/p Lt THA    Clinical Impression  Patient presents with decreased independence with mobility due to deficits listed in PT problem list.  Currently mod A for safety with ambulation with RW and chair to follow due to decreased endurance. Feel patient has support to return home with family assist, [place] program with HHPT/aide.    Follow Up Recommendations Home health PT;Supervision/Assistance - 24 hour(recommend [facility])    Equipment Recommendations  None recommended by PT    Recommendations for Other Services       Precautions / Restrictions Precautions Precautions: Fall      Mobility  Bed Mobility Overal bed mobility: Needs Assistance Bed Mobility: Rolling;Sidelying to Sit Rolling: Min assist Sidelying to sit: Mod assist       General bed mobility comments: step by cues for sequencing.  Assist to initiate moving LEs off bed and assist to lift trunk from bed   Transfers Overall transfer level: Needs assistance Equipment used: Rolling walker (2 wheeled) Transfers: Sit to/from [facility] Sit to Stand: Mod assist;+2 safety/equipment Stand pivot transfers: Mod assist;+2 safety/equipment       General transfer comment: assist to power up into standing, assist for balance, and assist to maneuver RW   Ambulation/Gait Ambulation/Gait assistance: Mod assist;+2 safety/equipment Ambulation Distance (Feet): 12 Feet Assistive device: Rolling walker (2 wheeled) Gait Pattern/deviations: Step-to pattern;Decreased stride length;Shuffle;Trunk flexed     General Gait  Details: very slow to initiate stepping and needed encouragement to continue, difficulty getting any clearance for stepping, more of a shuffling, scooting pattern with lots of hesitation to lift his feet, improved some over time  Stairs            Wheelchair Mobility    Modified Rankin (Stroke Patients Only)       Balance Overall balance assessment: Needs assistance Sitting-balance support: Feet supported Sitting balance-Leahy Scale: Fair Sitting balance - Comments: static sitting with min guard assist EOB    Standing balance support: Bilateral upper extremity supported Standing balance-Leahy Scale: Poor Standing balance comment: requires bil. UE support and min A                              Pertinent Vitals/Pain Pain Assessment: No/denies pain    Home Living Family/patient expects to be discharged to:: Private residence Living Arrangements: Children Available Help at Discharge: Family;Available PRN/intermittently Type of Home: House Home Access: Level entry     Home Layout: One level Home Equipment: Walker - 2 wheels;Bedside commode;Shower seat - built in;Grab bars - toilet;Wheelchair - manual;Cane - single point Additional Comments: son, whom pt lives with has Bipolar syndrome, and is not fully reliable, per daughter    Prior Function Level of Independence: Independent;Independent with assistive device(s)         Comments: Pt ambulated with SPC, and Pt reports prior to onset of nausea 2 weeks ago, he was going to the "[facility]" 4 days/week      Hand Dominance   Dominant Hand: Right    Extremity/Trunk Assessment   Upper Extremity Assessment Upper Extremity  Assessment: Defer to OT evaluation RUE Deficits / Details: Bil. UEs tremulous, which pt reports is new.  Rt shoulder weakness noted.  He has h/o shoulder impingement syndrome  RUE Coordination: decreased fine motor;decreased gross motor LUE Deficits / Details: Bil. UEs tremulous, which pt reports  is new.  LUE Coordination: decreased fine motor;decreased gross motor    Lower Extremity Assessment Lower Extremity Assessment: LLE deficits/detail;RLE deficits/detail RLE Deficits / Details: AROM WFL, strength hip flexion 4-/5, knee extension 4/5, ankle DF 4+/5 RLE Sensation: decreased light touch LLE Deficits / Details: AROM WFL, strength hip flexion 4-/5, knee extension 4/5, ankle DF 4+/5 LLE Sensation: decreased light touch    Cervical / Trunk Assessment Cervical / Trunk Assessment: Kyphotic  Communication   Communication: Expressive difficulties;HOH(low volume)  Cognition Arousal/Alertness: Awake/alert Behavior During Therapy: Flat affect Overall Cognitive Status: Impaired/Different from baseline Area of Impairment: Attention;Following commands;Problem solving                   Current Attention Level: Selective   Following Commands: Follows one step commands consistently;Follows one step commands inconsistently     Problem Solving: Slow processing;Decreased initiation;Difficulty sequencing;Requires verbal cues;Requires tactile cues General Comments: Pt has hearing loss, making it a bit difficult to accurately assess cognition.  He is slow to initiate movement, and demonstrates difficulty with sequencing activity       General Comments General comments (skin integrity, edema, etc.): daughter present at end of session    Exercises     Assessment/Plan    PT Assessment Patient needs continued PT services  PT Problem List Decreased strength;Decreased balance;Decreased mobility;Decreased activity tolerance;Decreased safety awareness;Decreased knowledge of use of DME;Decreased cognition       PT Treatment Interventions DME instruction;Therapeutic activities;Gait training;Patient/family education;Therapeutic exercise;Balance training;Functional mobility training    PT Goals (Current goals can be found in the Care Plan section)  Acute Rehab PT Goals Patient Stated  Goal: to regain independence  PT Goal Formulation: With patient/family Time For Goal Achievement: 01/02/18 Potential to Achieve Goals: Good    Frequency Min 3X/week   Barriers to discharge        Co-evaluation PT/OT/SLP Co-Evaluation/Treatment: Yes Reason for Co-Treatment: To address functional/ADL transfers;For patient/therapist safety PT goals addressed during session: Mobility/safety with mobility;Balance;Proper use of DME OT goals addressed during session: ADL's and self-care       AM-PAC PT "6 Clicks" Daily Activity  Outcome Measure Difficulty turning over in bed (including adjusting bedclothes, sheets and blankets)?: A Lot Difficulty moving from lying on back to sitting on the side of the bed? : Unable Difficulty sitting down on and standing up from a chair with arms (e.g., wheelchair, bedside commode, etc,.)?: Unable Help needed moving to and from a bed to chair (including a wheelchair)?: A Lot Help needed walking in hospital room?: A Lot Help needed climbing 3-5 steps with a railing? : Total 6 Click Score: 9    End of Session Equipment Utilized During Treatment: Gait belt Activity Tolerance: Patient tolerated treatment well Patient left: with call bell/phone within reach;in chair;with family/visitor present   PT Visit Diagnosis: Other abnormalities of gait and mobility (R26.89)    Time: 5397-6734 PT Time Calculation (min) (ACUTE ONLY): 24 min   Charges:   PT Evaluation $PT Eval Moderate Complexity: 1 Mod     PT G CodesMagda Kiel, [place] [phone] 12/19/2017   Reginia Naas 12/19/2017, 1:05 PM

## 2017-12-19 NOTE — Consult Note (Addendum)
Chief Complaint: Patient was seen in consultation today for percutaneous cholecystostomy drain placement Chief Complaint  Patient presents with  . Atrial Fibrillation  . Weakness   at the request of Dr Ok Anis  Supervising Physician: Sandi Mariscal  Patient Status: [facility] - In-pt  History of Present Illness: Lance Martin is a 84 y.o. male   Acute acalculous cholecystitis Cirrhosis Leukocytosis RUQ abd pain New fatigue; general weakness; abd pain and decreased appetite N/V [place] 5/8: IMPRESSION: 1. Thick-walled gallbladder with biliary sludge noted within. 2. Cirrhotic appearing liver with adjacent small volume of perihepatic ascites. Mixed color Doppler signal within the imaged portal venous system may represent early changes of portal hypertension though the portal vein is patent.  MRCP 5/9:  IMPRESSION: Hepatic cirrhosis and mild ascites. Dilated gallbladder with sludge, moderate diffuse wall thickening and pericholecystic edema. These findings are greater than typically seen with cirrhosis, and acute cholecystitis cannot be excluded. If clinically warranted, nuclear medicine hepatobiliary scan could be performed for further evaluation. No evidence of biliary ductal dilatation.  CCS feels not surgical candidate - high risk with new onset cirrhosis Requesting percutaneous cholecystostomy drain placement Dr Laurence Ferrari has approved procedure Now scheduled for today  Past Medical History:  Diagnosis Date  . Arthritis   . AV block 05/13 ecg   1st degree  . Bleeding    bladder,radiation cystitis-s/p cystoscopy, clot evacuation, fulgeration of bleeders  . Cancer ([place])    prostate, hx of bsal cell skin cancer   . Cataract   . Gout   . Hematuria   . Hemorrhoid   . History of basal cell carcinoma excision 08-29-2011  left frontal scalp  . History of gout PER PT -  STABLE  . History of prostate cancer 2004   -  RX EXTERNAL RADIATION  . Hx of subdural hematoma  08-14-2011    s/p total left hip  12-92012---  resolved w/ no residual  . Hyperlipidemia   . Hypertension   . IFG (impaired fasting glucose)   . Lab findings of hypoxemia    abnormal ONO , dr perini. 05-26-2012  . Microalbuminuria 2011  . OA (osteoarthritis) of hip   . Obstructive apnea    AHI of 35 on 01-14-13 , titrated to 6 cm H20  . Pneumonia    hx of 2015   . Shoulder impingement syndrome    right  . Sleep apnea    cpap nightly  . Sleep apnea with use of continuous positive airway pressure (CPAP) 03/19/2013    Past Surgical History:  Procedure Laterality Date  . CARDIAC CATHETERIZATION     pt denies  . COLONOSCOPY    . CYSTOSCOPY WITH BIOPSY  07/03/2012   Procedure: CYSTOSCOPY WITH BIOPSY;  Surgeon: Franchot Gallo, MD;  Location: [facility];  Service: Urology;  Laterality: N/A;      . CYSTOSCOPY WITH FULGERATION N/A 12/02/2016   Procedure: CYSTOSCOPY WITH FULGERATION OF BLEEDERS AND TRANSURETRAL INCISION OF PROSTATE;  Surgeon: Franchot Gallo, MD;  Location: [facility];  Service: Urology;  Laterality: N/A;  . EYE SURGERY  2009   bilateral cataract surgery   . JOINT REPLACEMENT    . MOHS SURGERY    . PROSTATE SURGERY    . RIGHT ELBOW SURGERY  MAR 2013  . right elbow surgery  2009   ELBOW AND WRIST SURGERY  . TONSILLECTOMY     AGE 10  . TOTAL HIP ARTHROPLASTY  07/31/2011   Procedure: TOTAL HIP ARTHROPLASTY;  Surgeon: Dione Plover Aluisio;  Location: [facility];  Service: Orthopedics;  Laterality: Left;  . TOTAL HIP ARTHROPLASTY    . TRANSURETHRAL RESECTION OF PROSTATE  07/25/2012   Procedure: TRANSURETHRAL RESECTION OF THE PROSTATE WITH GYRUS INSTRUMENTS;  Surgeon: Alexis Frock, MD;  Location: [facility];  Service: Urology;  Laterality: N/A;  CYSTOSCOPY;CLOT EVACUATION WITH FULGERATION  . ULNAR NERVE REPAIR  04/13    Allergies: Allopurinol and Ampicillin  Medications: Prior to Admission medications   Medication Sig Start Date End Date Taking? Authorizing  Provider  amLODipine (NORVASC) 2.5 MG tablet Take 2.5 mg by mouth every evening.  11/25/16  Yes [provider]  atorvastatin (LIPITOR) 40 MG tablet Take 40 mg by mouth daily.   Yes [provider]  benazepril (LOTENSIN) 40 MG tablet Take 40 mg by mouth at bedtime.    Yes [provider]  calcium-vitamin D (OSCAL WITH D) 500-200 MG-UNIT tablet Take 1 tablet by mouth.   Yes [provider]  cetirizine (ZYRTEC) 10 MG tablet Take 10 mg by mouth daily.   Yes [provider]  cholecalciferol (VITAMIN D) 1000 units tablet Take 1,000 Units by mouth daily.   Yes [provider]  Coenzyme Q10 (COQ-10) 100 MG CAPS Take 1 Can by mouth as needed.    Yes [provider]  Flaxseed, Linseed, (FLAXSEED OIL PO) Take 1,400 mg by mouth daily. Will stop prior to procedure   Yes [provider]  fluticasone (FLONASE) 50 MCG/ACT nasal spray Place 2 sprays into the nose daily. ALLERGIES   Yes [provider]  metoprolol succinate (TOPROL-XL) 25 MG 24 hr tablet Take 25 mg by mouth daily.   Yes [provider]  Multiple Vitamins-Minerals (MULTIVITAMINS THER. W/MINERALS) TABS Take 1 tablet by mouth daily.    Yes [provider]  TURMERIC PO Take 1,000 mg by mouth 2 (two) times daily.   Yes [provider]  Febuxostat 80 MG TABS Take 80 mg by mouth daily.     [provider]  omeprazole (PRILOSEC) 40 MG capsule Take 1 capsule (40 mg total) by mouth daily. 09/04/17   Zehr, Laban Emperor, PA-C     Family History  Problem Relation Age of Onset  . Colon cancer Neg Hx   . Stomach cancer Neg Hx   . Esophageal cancer Neg Hx     Social History   Socioeconomic History  . Marital status: Married    Spouse name: Lance Martin  . Number of children: 2  . Years of education: Not on file  . Highest education level: Not on file  Occupational History  . Occupation: Paediatric nurse  Social Needs  . Financial resource strain:  Not on file  . Food insecurity:    Worry: Not on file    Inability: Not on file  . Transportation needs:    Medical: Not on file    Non-medical: Not on file  Tobacco Use  . Smoking status: Never Smoker  . Smokeless tobacco: Never Used  Substance and Sexual Activity  . Alcohol use: Yes    Alcohol/week: 15.0 oz    Types: 4 Glasses of wine, 21 Shots of liquor per week    Comment: 4 drinks per day , all in whiskey category, 1/2 glass of wine at dinner   . Drug use: No  . Sexual activity: Not on file  Lifestyle  . Physical activity:    Days per week: Not on file    Minutes per session: Not  on file  . Stress: Not on file  Relationships  . Social connections:    Talks on phone: Not on file    Gets together: Not on file    Attends religious service: Not on file    Active member of club or organization: Not on file    Attends meetings of clubs or organizations: Not on file    Relationship status: Not on file  Other Topics Concern  . Not on file  Social History Narrative  . Not on file    Review of Systems: A 12 point ROS discussed and pertinent positives are indicated in the HPI above.  All other systems are negative.  Review of Systems  Constitutional: Positive for activity change, appetite change and fatigue.  Respiratory: Negative for cough and shortness of breath.   Cardiovascular: Negative for chest pain.  Gastrointestinal: Positive for abdominal pain, nausea and vomiting.  Musculoskeletal: Positive for myalgias.  Neurological: Positive for weakness.  Psychiatric/Behavioral: Negative for behavioral problems and confusion.    Vital Signs: BP (!) 97/41 (BP Location: Left Arm)   Pulse 99   Temp 98.4 F (36.9 C) (Oral)   Resp (!) 26   Ht 5\' 8"  (1.727 m)   Wt 160 lb (72.6 kg)   SpO2 94%   BMI 24.33 kg/m   Physical Exam  Constitutional: He is oriented to person, place, and time.  Cardiovascular: Normal rate and regular rhythm.  Pulmonary/Chest: Effort normal.    Abdominal: Soft. There is tenderness.  Musculoskeletal: Normal range of motion.  Neurological: He is alert and oriented to person, place, and time.  Skin: Skin is warm and dry.  Psychiatric: He has a normal mood and affect. His behavior is normal. Judgment and thought content normal.  Nursing note and vitals reviewed.   Imaging: Dg Chest 2 View  Result Date: 12/17/2017 CLINICAL DATA:  Sepsis EXAM: CHEST - 2 VIEW COMPARISON:  01/16/2014 FINDINGS: Mild interstitial edema. Heart size and mediastinal contours are stable with minimal aortic atherosclerosis. No pulmonary consolidation. No acute osseous abnormality. IMPRESSION: Mild interstitial edema. No acute pulmonary consolidation. Aortic atherosclerosis. Electronically Signed   By: Ashley Royalty M.D.   On: 12/17/2017 23:05   Mr Abdomen Mrcp Wo Contrast  Result Date: 12/18/2017 CLINICAL DATA:  Right upper quadrant pain and jaundice. Abnormal liver and gallbladder on ultrasound. EXAM: MRI ABDOMEN WITHOUT CONTRAST  (INCLUDING MRCP) TECHNIQUE: Multiplanar multisequence MR imaging of the abdomen was performed. Heavily T2-weighted images of the biliary and pancreatic ducts were obtained, and three-dimensional MRCP images were rendered by post processing. COMPARISON:  Ultrasound on 12/17/2017 FINDINGS: Lower chest: No acute findings. Hepatobiliary: Image degradation by motion artifact noted. Hepatic cirrhosis is demonstrated. No hepatic mass visualized on this unenhanced exam. The gallbladder is dilated and shows moderate diffuse wall thickening and pericholecystic edema. Sludge is also seen within the gallbladder lumen. These findings are greater than typically seen with cirrhosis, and acute cholecystitis cannot be excluded. There is no evidence of biliary ductal dilatation, with common bile duct measuring approximately 5 mm. Pancreas: No mass or inflammatory process visualized on this unenhanced exam. No evidence of pancreatic ductal dilatation. Spleen:   Within normal limits in size. Adrenals/Urinary tract: Tiny right renal cyst noted. No evidence of hydronephrosis. Stomach/Bowel: Visualized portion unremarkable. Vascular/Lymphatic: No pathologically enlarged lymph nodes identified. No evidence of abdominal aortic aneurysm. Other:  Mild ascites. Musculoskeletal:  No suspicious bone lesions identified. IMPRESSION: Hepatic cirrhosis and mild ascites. Dilated gallbladder with sludge, moderate diffuse wall thickening  and pericholecystic edema. These findings are greater than typically seen with cirrhosis, and acute cholecystitis cannot be excluded. If clinically warranted, nuclear medicine hepatobiliary scan could be performed for further evaluation. No evidence of biliary ductal dilatation. Electronically Signed   By: Earle Gell M.D.   On: 12/18/2017 07:32   Mr 3d Recon At Scanner  Result Date: 12/18/2017 CLINICAL DATA:  Right upper quadrant pain and jaundice. Abnormal liver and gallbladder on ultrasound. EXAM: MRI ABDOMEN WITHOUT CONTRAST  (INCLUDING MRCP) TECHNIQUE: Multiplanar multisequence MR imaging of the abdomen was performed. Heavily T2-weighted images of the biliary and pancreatic ducts were obtained, and three-dimensional MRCP images were rendered by post processing. COMPARISON:  Ultrasound on 12/17/2017 FINDINGS: Lower chest: No acute findings. Hepatobiliary: Image degradation by motion artifact noted. Hepatic cirrhosis is demonstrated. No hepatic mass visualized on this unenhanced exam. The gallbladder is dilated and shows moderate diffuse wall thickening and pericholecystic edema. Sludge is also seen within the gallbladder lumen. These findings are greater than typically seen with cirrhosis, and acute cholecystitis cannot be excluded. There is no evidence of biliary ductal dilatation, with common bile duct measuring approximately 5 mm. Pancreas: No mass or inflammatory process visualized on this unenhanced exam. No evidence of pancreatic ductal  dilatation. Spleen:  Within normal limits in size. Adrenals/Urinary tract: Tiny right renal cyst noted. No evidence of hydronephrosis. Stomach/Bowel: Visualized portion unremarkable. Vascular/Lymphatic: No pathologically enlarged lymph nodes identified. No evidence of abdominal aortic aneurysm. Other:  Mild ascites. Musculoskeletal:  No suspicious bone lesions identified. IMPRESSION: Hepatic cirrhosis and mild ascites. Dilated gallbladder with sludge, moderate diffuse wall thickening and pericholecystic edema. These findings are greater than typically seen with cirrhosis, and acute cholecystitis cannot be excluded. If clinically warranted, nuclear medicine hepatobiliary scan could be performed for further evaluation. No evidence of biliary ductal dilatation. Electronically Signed   By: Earle Gell M.D.   On: 12/18/2017 07:32   US Abdomen Limited Ruq  Result Date: 12/17/2017 CLINICAL DATA:  Right upper quadrant abdominal pain and jaundice. EXAM: ULTRASOUND ABDOMEN LIMITED RIGHT UPPER QUADRANT COMPARISON:  None. FINDINGS: Gallbladder: Tumefactive biliary sludge is identified within the gallbladder. The single wall thickness of the gallbladder is 4.2 mm, normal less than 3 mm. No pericholecystic fluid. No sonographic Murphy sign noted by sonographer. Common bile duct: Diameter: 5 mm Liver: No focal lesion identified given limitations due to the patient's inability to adequately position for better images. Within normal limits in parenchymal echogenicity. Portal vein is patent on color Doppler imaging though indeterminate in direction of blood flow given mixed color signal noted within. Nodular liver surface compatible with cirrhosis. Adjacent small volume of ascites. IMPRESSION: 1. Thick-walled gallbladder with biliary sludge noted within. 2. Cirrhotic appearing liver with adjacent small volume of perihepatic ascites. Mixed color Doppler signal within the imaged portal venous system may represent early changes of  portal hypertension though the portal vein is patent. Electronically Signed   By: Ashley Royalty M.D.   On: 12/17/2017 19:55    Labs:  CBC: Recent Labs    12/17/17 1521 12/18/17 0651 12/18/17 0935 12/19/17 0308  WBC 18.2* 21.5* 21.3* 17.9*  HGB 13.6 12.1* 12.2* 11.4*  HCT 37.7* 33.8* 34.8* 31.7*  PLT 176 160 146* 161    COAGS: Recent Labs    12/17/17 2123 12/18/17 0651 12/19/17 0647  INR 1.45 1.72 1.76  APTT 38* 189*  --     BMP: Recent Labs    12/17/17 1823 12/18/17 0651 12/19/17 0308  NA 134* 135  136  K 3.6 3.2* 3.0*  CL 98* 100* 105  CO2 22 22 22   GLUCOSE 97 77 78  BUN 47* 43* 37*  CALCIUM 8.4* 8.2* 7.8*  CREATININE 1.98* 1.78* 1.58*  GFRNONAA 30* 34* 39*  GFRAA 34* 39* 45*    LIVER FUNCTION TESTS: Recent Labs    12/17/17 1823 12/18/17 0651 12/19/17 0308  BILITOT 5.4* 5.4* 4.5*  AST 105* 86* 80*  ALT 64* 56 49  ALKPHOS 196* 167* 183*  PROT 5.7* 5.2* 4.9*  ALBUMIN 2.6* 2.4* 2.1*    TUMOR MARKERS: No results for input(s): AFPTM, CEA, CA199, CHROMGRNA in the last 8760 hours.  Assessment and Plan:  RUQ and pain; N/V Cirrhosis Leukocytosis Acute acalculous cholecystitis Scheduled for percutaneous cholecystostomy drain INR 1.76  Risks and benefits discussed with the patient including, but not limited to bleeding, infection, gallbladder perforation, bile leak, sepsis or even death.  All of the patient's questions were answered, patient is agreeable to proceed. Consent signed and in chart.   Thank you for this interesting consult.  I greatly enjoyed meeting REY DANSBY and look forward to participating in their care.  A copy of this report was sent to the requesting provider on this date.  Electronically Signed: Lavonia Drafts, PA-C 12/19/2017, 7:52 AM   I spent a total of 40 Minutes    in face to face in clinical consultation, greater than 50% of which was counseling/coordinating care for perc chole drain

## 2017-12-19 NOTE — Progress Notes (Addendum)
Daily Rounding Note  12/19/2017, 12:19 PM  LOS: 2 days   SUBJECTIVE:   Chief complaint: Abdominal pain improved.  Was able to eat 100% of his regular diet last night.  He is recently having some trouble swallowing and has increased regurgitation.  This is only been present for maybe 10 days and he did not notice it that much because he was mostly eating liquids in that period of time. Brown stool today, this was his first in about a week.   OBJECTIVE:         Vital signs in last 24 hours:    Temp:  [97.5 F (36.4 C)-99.4 F (37.4 C)] 97.6 F (36.4 C) (05/10 0820) Pulse Rate:  [98-109] 99 (05/10 0820) Resp:  [19-26] 26 (05/09 2309) BP: (97-128)/(41-75) 118/58 (05/10 0820) SpO2:  [92 %-98 %] 95 % (05/10 0820) Last BM Date: (PTA) Filed Weights   12/18/17 0321  Weight: 160 lb (72.6 kg)   General: Frail, comfortable, pleasant, aged Heart: RRR.  No MRG. Chest: Clear bilaterally. Abdomen: Soft.  Active bowel sounds.  Minimal tenderness in the epigastrium/left upper quadrant. Extremities: No CCE. Neuro/Psych: Oriented x3.  Alert.  Speech is slow but clear.  No asterixis.  Intake/Output from previous day: 05/09 0701 - 05/10 0700 In: 1305 [P.O.:480; I.V.:525; IV Piggyback:300] Out: 550 [Urine:550]  Intake/Output this shift: Total I/O In: -  Out: 350 [Urine:350]  Lab Results: Recent Labs    12/18/17 0651 12/18/17 0935 12/19/17 0308  WBC 21.5* 21.3* 17.9*  HGB 12.1* 12.2* 11.4*  HCT 33.8* 34.8* 31.7*  PLT 160 146* 161   BMET Recent Labs    12/17/17 1823 12/18/17 0651 12/19/17 0308  NA 134* 135 136  K 3.6 3.2* 3.0*  CL 98* 100* 105  CO2 22 22 22   GLUCOSE 97 77 78  BUN 47* 43* 37*  CREATININE 1.98* 1.78* 1.58*  CALCIUM 8.4* 8.2* 7.8*   LFT Recent Labs    12/17/17 1823 12/18/17 0651 12/19/17 0308  PROT 5.7* 5.2* 4.9*  ALBUMIN 2.6* 2.4* 2.1*  AST 105* 86* 80*  ALT 64* 56 49  ALKPHOS 196* 167*  183*  BILITOT 5.4* 5.4* 4.5*   PT/INR Recent Labs    12/18/17 0651 12/19/17 0647  LABPROT 20.0* 20.4*  INR 1.72 1.76   Hepatitis Panel No results for input(s): HEPBSAG, HCVAB, HEPAIGM, HEPBIGM in the last 72 hours.  Studies/Results: Dg Chest 2 View  Result Date: 12/17/2017 CLINICAL DATA:  Sepsis EXAM: CHEST - 2 VIEW COMPARISON:  01/16/2014 FINDINGS: Mild interstitial edema. Heart size and mediastinal contours are stable with minimal aortic atherosclerosis. No pulmonary consolidation. No acute osseous abnormality. IMPRESSION: Mild interstitial edema. No acute pulmonary consolidation. Aortic atherosclerosis. Electronically Signed   By: Ashley Royalty M.D.   On: 12/17/2017 23:05   Mr Abdomen Mrcp Wo Contrast  Result Date: 12/18/2017 CLINICAL DATA:  Right upper quadrant pain and jaundice. Abnormal liver and gallbladder on ultrasound. EXAM: MRI ABDOMEN WITHOUT CONTRAST  (INCLUDING MRCP) TECHNIQUE: Multiplanar multisequence MR imaging of the abdomen was performed. Heavily T2-weighted images of the biliary and pancreatic ducts were obtained, and three-dimensional MRCP images were rendered by post processing. COMPARISON:  Ultrasound on 12/17/2017 FINDINGS: Lower chest: No acute findings. Hepatobiliary: Image degradation by motion artifact noted. Hepatic cirrhosis is demonstrated. No hepatic mass visualized on this unenhanced exam. The gallbladder is dilated and shows moderate diffuse wall thickening and pericholecystic edema. Sludge is also seen within the gallbladder  lumen. These findings are greater than typically seen with cirrhosis, and acute cholecystitis cannot be excluded. There is no evidence of biliary ductal dilatation, with common bile duct measuring approximately 5 mm. Pancreas: No mass or inflammatory process visualized on this unenhanced exam. No evidence of pancreatic ductal dilatation. Spleen:  Within normal limits in size. Adrenals/Urinary tract: Tiny right renal cyst noted. No evidence of  hydronephrosis. Stomach/Bowel: Visualized portion unremarkable. Vascular/Lymphatic: No pathologically enlarged lymph nodes identified. No evidence of abdominal aortic aneurysm. Other:  Mild ascites. Musculoskeletal:  No suspicious bone lesions identified. IMPRESSION: Hepatic cirrhosis and mild ascites. Dilated gallbladder with sludge, moderate diffuse wall thickening and pericholecystic edema. These findings are greater than typically seen with cirrhosis, and acute cholecystitis cannot be excluded. If clinically warranted, nuclear medicine hepatobiliary scan could be performed for further evaluation. No evidence of biliary ductal dilatation. Electronically Signed   By: Earle Gell M.D.   On: 12/18/2017 07:32   Mr 3d Recon At Scanner  Result Date: 12/18/2017 CLINICAL DATA:  Right upper quadrant pain and jaundice. Abnormal liver and gallbladder on ultrasound. EXAM: MRI ABDOMEN WITHOUT CONTRAST  (INCLUDING MRCP) TECHNIQUE: Multiplanar multisequence MR imaging of the abdomen was performed. Heavily T2-weighted images of the biliary and pancreatic ducts were obtained, and three-dimensional MRCP images were rendered by post processing. COMPARISON:  Ultrasound on 12/17/2017 FINDINGS: Lower chest: No acute findings. Hepatobiliary: Image degradation by motion artifact noted. Hepatic cirrhosis is demonstrated. No hepatic mass visualized on this unenhanced exam. The gallbladder is dilated and shows moderate diffuse wall thickening and pericholecystic edema. Sludge is also seen within the gallbladder lumen. These findings are greater than typically seen with cirrhosis, and acute cholecystitis cannot be excluded. There is no evidence of biliary ductal dilatation, with common bile duct measuring approximately 5 mm. Pancreas: No mass or inflammatory process visualized on this unenhanced exam. No evidence of pancreatic ductal dilatation. Spleen:  Within normal limits in size. Adrenals/Urinary tract: Tiny right renal cyst noted.  No evidence of hydronephrosis. Stomach/Bowel: Visualized portion unremarkable. Vascular/Lymphatic: No pathologically enlarged lymph nodes identified. No evidence of abdominal aortic aneurysm. Other:  Mild ascites. Musculoskeletal:  No suspicious bone lesions identified. IMPRESSION: Hepatic cirrhosis and mild ascites. Dilated gallbladder with sludge, moderate diffuse wall thickening and pericholecystic edema. These findings are greater than typically seen with cirrhosis, and acute cholecystitis cannot be excluded. If clinically warranted, nuclear medicine hepatobiliary scan could be performed for further evaluation. No evidence of biliary ductal dilatation. Electronically Signed   By: Earle Gell M.D.   On: 12/18/2017 07:32   US Abdomen Limited Ruq  Result Date: 12/17/2017 CLINICAL DATA:  Right upper quadrant abdominal pain and jaundice. EXAM: ULTRASOUND ABDOMEN LIMITED RIGHT UPPER QUADRANT COMPARISON:  None. FINDINGS: Gallbladder: Tumefactive biliary sludge is identified within the gallbladder. The single wall thickness of the gallbladder is 4.2 mm, normal less than 3 mm. No pericholecystic fluid. No sonographic Murphy sign noted by sonographer. Common bile duct: Diameter: 5 mm Liver: No focal lesion identified given limitations due to the patient's inability to adequately position for better images. Within normal limits in parenchymal echogenicity. Portal vein is patent on color Doppler imaging though indeterminate in direction of blood flow given mixed color signal noted within. Nodular liver surface compatible with cirrhosis. Adjacent small volume of ascites. IMPRESSION: 1. Thick-walled gallbladder with biliary sludge noted within. 2. Cirrhotic appearing liver with adjacent small volume of perihepatic ascites. Mixed color Doppler signal within the imaged portal venous system may represent early changes of portal hypertension  though the portal vein is patent. Electronically Signed   By: Ashley Royalty M.D.   On:  12/17/2017 19:55   Scheduled Meds: . metoprolol tartrate  2.5 mg Intravenous Q6H  . potassium chloride  40 mEq Oral Once   Continuous Infusions: . sodium chloride 75 mL/hr at 12/18/17 2101  . ceFEPime (MAXIPIME) IV Stopped (12/19/17 1005)  . metronidazole Stopped (12/19/17 0607)   PRN Meds:.acetaminophen, morphine injection, ondansetron **OR** ondansetron (ZOFRAN) IV, traMADol  ASSESMENT:   *   Abdominal pain with jaundice and associated sepsis.  Ultrasound and MRCP suggest acute cholecystitis and do not reveal any biliary ductal dilatation or filling defects. General surgery/IR planning perc cholecystostomy tube for acute cholecystitis in pt with high surgical risk.   On cefepime, metronidazole.  WBCs improved.    *    New diagnosis of cirrhosis, with mild ascites and possible portal venous hypertension. Fatty liver noted on CT scan 2004. Is jaundice due to cirrhosis vs cholecystitis vs ETOH hepatitis?   Patient drinks heavily.  No alcohol for past 8 or 9 days and no signs of withdrawal. Labs for r/o autoimmune, metabolic causes include: Ceruloplasmin, processing ANA, processing Smooth muscle Ab, processing.   Hepatitis B and C serologies processing.  *    Coagulopathy,  Thrombocytopenia resp;ved/      *     Tachycardia.  On review, Dr Theodosia Blender diagnosis is sinus tachycardia with prolonged first-degree AV block, frequent PACs and nonsustained atrial tachycardia, not A fib/RVR, not A. fib with RVR.  IV heparin discontinued.  *   AKI.   Improved.  Baseline unknown.     *   Hypokalemia.  Treated this morning with oral and IV potassium.     *   Mild elevation of venous ammonia at 43.  *   Dysphagia.     PLAN   *  Plan per gen surgery and IR is for perc drain placement.    *    Add low-dose, once daily lactulose to cover any possible HEENT and treat constipation.  *  At some point would work up dysphagia with Esophagram.  This is not urgent and could be delayed until  early next week.    Lance Martin  12/19/2017, 12:19 PM Phone [phone]   Attending physician's note   I have taken an interval history, reviewed the chart and examined the patient. I agree with the Advanced Practitioner's note, impression and recommendations.   Plan for percutaneous cholecystomy by IR later today  K. Denzil Magnuson , MD [phone]

## 2017-12-20 ENCOUNTER — Other Ambulatory Visit: Payer: Self-pay

## 2017-12-20 ENCOUNTER — Inpatient Hospital Stay (HOSPITAL_COMMUNITY): Payer: Medicare Other

## 2017-12-20 DIAGNOSIS — K810 Acute cholecystitis: Secondary | ICD-10-CM

## 2017-12-20 DIAGNOSIS — I498 Other specified cardiac arrhythmias: Secondary | ICD-10-CM

## 2017-12-20 LAB — MAGNESIUM: MAGNESIUM: 1.9 mg/dL (ref 1.7–2.4)

## 2017-12-20 LAB — CBC
HCT: 33.5 % — ABNORMAL LOW (ref 39.0–52.0)
Hemoglobin: 11.8 g/dL — ABNORMAL LOW (ref 13.0–17.0)
MCH: 34.7 pg — AB (ref 26.0–34.0)
MCHC: 35.2 g/dL (ref 30.0–36.0)
MCV: 98.5 fL (ref 78.0–100.0)
PLATELETS: 177 10*3/uL (ref 150–400)
RBC: 3.4 MIL/uL — ABNORMAL LOW (ref 4.22–5.81)
RDW: 15.2 % (ref 11.5–15.5)
WBC: 11.9 10*3/uL — AB (ref 4.0–10.5)

## 2017-12-20 LAB — HEPATITIS B SURFACE ANTIBODY,QUALITATIVE: HEP B S AB: NONREACTIVE

## 2017-12-20 LAB — BASIC METABOLIC PANEL
ANION GAP: 9 (ref 5–15)
BUN: 31 mg/dL — AB (ref 6–20)
CALCIUM: 8 mg/dL — AB (ref 8.9–10.3)
CO2: 20 mmol/L — ABNORMAL LOW (ref 22–32)
Chloride: 110 mmol/L (ref 101–111)
Creatinine, Ser: 1.31 mg/dL — ABNORMAL HIGH (ref 0.61–1.24)
GFR calc Af Amer: 56 mL/min — ABNORMAL LOW (ref >60)
GFR, EST NON AFRICAN AMERICAN: 49 mL/min — AB (ref >60)
GLUCOSE: 87 mg/dL (ref 65–99)
Potassium: 4 mmol/L (ref 3.5–5.1)
Sodium: 139 mmol/L (ref 135–145)

## 2017-12-20 LAB — HEPATITIS C ANTIBODY: HCV AB: 0.1 {s_co_ratio} (ref 0.0–0.9)

## 2017-12-20 LAB — HEPATITIS B SURFACE ANTIGEN: Hepatitis B Surface Ag: NEGATIVE

## 2017-12-20 LAB — CERULOPLASMIN: CERULOPLASMIN: 33.8 mg/dL — AB (ref 16.0–31.0)

## 2017-12-20 MED ORDER — SODIUM CHLORIDE 0.9 % IV SOLN
2.0000 g | INTRAVENOUS | Status: DC
  Administered 2017-12-21: 2 g via INTRAVENOUS
  Filled 2017-12-20: qty 2

## 2017-12-20 NOTE — Progress Notes (Signed)
Subjective/Chief Complaint: Feels much better Tolerating clears   Objective: Vital signs in last 24 hours: Temp:  [97.4 F (36.3 C)-97.8 F (36.6 C)] 97.6 F (36.4 C) (05/11 0523) Pulse Rate:  [84-101] 92 (05/11 0523) Resp:  [18-28] 20 (05/11 0523) BP: (104-135)/(55-71) 131/69 (05/11 0523) SpO2:  [95 %-100 %] 97 % (05/11 0523) Weight:  [69.8 kg (153 lb 14.4 oz)] 69.8 kg (153 lb 14.4 oz) (05/11 0523) Last BM Date: (PTA)  Intake/Output from previous day: 05/10 0701 - 05/11 0700 In: 2005 [I.V.:1505; IV Piggyback:500] Out: 0350 [Urine:1150; Drains:225] Intake/Output this shift: No intake/output data recorded.  General appearance: alert, cooperative and no distress GI: soft, minimal tenderness Drain - thick bile in bag  Lab Results:  Recent Labs    12/19/17 0308 12/20/17 0542  WBC 17.9* 11.9*  HGB 11.4* 11.8*  HCT 31.7* 33.5*  PLT 161 177   BMET Recent Labs    12/19/17 0308 12/20/17 0542  NA 136 139  K 3.0* 4.0  CL 105 110  CO2 22 20*  GLUCOSE 78 87  BUN 37* 31*  CREATININE 1.58* 1.31*  CALCIUM 7.8* 8.0*   PT/INR Recent Labs    12/18/17 0651 12/19/17 0647  LABPROT 20.0* 20.4*  INR 1.72 1.76   ABG No results for input(s): PHART, HCO3 in the last 72 hours.  Invalid input(s): PCO2, PO2  Studies/Results: Ir Perc Cholecystostomy  Result Date: 12/19/2017 INDICATION: 84 year old male with acute cholecystitis (sludge, no definite stones). He is currently a poor operative candidate in the setting of hepatic cirrhosis. He presents for percutaneous cholecystostomy tube placement. EXAM: CHOLECYSTOSTOMY MEDICATIONS: In patient currently receiving intravenous cefepime and Flagyl. No additional antibiotic prophylaxis administered. ANESTHESIA/SEDATION: Moderate (conscious) sedation was employed during this procedure. A total of Versed 1 mg and Fentanyl 50 mcg was administered intravenously. Moderate Sedation Time: 13 minutes. The patient's level of consciousness  and vital signs were monitored continuously by radiology nursing throughout the procedure under my direct supervision. FLUOROSCOPY TIME:  Fluoroscopy Time: 1 minutes 0 seconds (21 mGy). COMPLICATIONS: None immediate. PROCEDURE: Informed written consent was obtained from the patient after a thorough discussion of the procedural risks, benefits and alternatives. All questions were addressed. Maximal Sterile Barrier Technique was utilized including caps, mask, sterile gowns, sterile gloves, sterile drape, hand hygiene and skin antiseptic. A timeout was performed prior to the initiation of the procedure. The right upper quadrant was interrogated with ultrasound. The right hemidiaphragm is elevated. The liver sits high in the right upper quadrant. The distended and inflamed gallbladder containing sludge was successfully identified. There is a small volume of perihepatic ascites. A suitable skin entry site was selected and marked. Local anesthesia was attained by infiltration with 1% lidocaine. A small dermatotomy was made. Under real-time sonographic guidance, the gallbladder lumen was punctured with a 21 gauge Accustick needle along a short transhepatic course. The 0.018 wire was then coiled in the gallbladder lumen. The needle was exchanged over the wire for the Accustick sheath which was advanced into the gallbladder. A gentle hand injection of contrast material confirms the Accustick sheath is within the gallbladder lumen. There is extensive debris within the lumen and cobblestoning of the mucosa. A Bentson wire was then coiled in the gallbladder lumen. The Accustick sheath was removed. The percutaneous tract was dilated to 10 [place] and a Cook 10.2 [place] all-purpose drainage catheter was advanced over the wire and formed in the gallbladder lumen. Aspiration was performed yielding approximately 60 mL of thick, purulent bile. A  sample was sent for Gram stain and culture. The cholecystostomy tube was gently flushed  and then connected to gravity bag drainage. The catheter was secured to the skin with 0 Prolene suture. The patient tolerated the procedure well. IMPRESSION: Successful placement of a percutaneous transhepatic cholecystostomy tube for the treatment of acute cholecystitis (sludge, no definite stones on imaging). PLAN: 1. Maintain tube to gravity bag drainage. 2. Given patient's underlying cirrhosis, he is unlikely to be common optimal surgical candidate in the future. 3. IR drain clinic in 4 weeks for cholangiogram through existing tube. If the cystic duct regains patency, the percutaneous drainage catheter could be removed after 6 weeks given that there is no definitive evidence of cholelithiasis. Signed, Criselda Peaches, MD [facility] Radiology Electronically Signed   By: Jacqulynn Cadet M.D.   On: 12/19/2017 16:13    Anti-infectives: Anti-infectives (From admission, onward)   Start     Dose/Rate Route Frequency Ordered Stop   12/18/17 1000  ceFEPIme (MAXIPIME) 2 g in sodium chloride 0.9 % 100 mL IVPB     2 g 200 mL/hr over 30 Minutes Intravenous Every 12 hours 12/17/17 2220     12/18/17 0600  metroNIDAZOLE (FLAGYL) IVPB 500 mg     500 mg 100 mL/hr over 60 Minutes Intravenous Every 8 hours 12/17/17 2220     12/17/17 2215  ceFEPIme (MAXIPIME) 2 g in sodium chloride 0.9 % 100 mL IVPB     2 g 200 mL/hr over 30 Minutes Intravenous  Once 12/17/17 2203 12/18/17 0206   12/17/17 2200  ciprofloxacin (CIPRO) IVPB 400 mg  Status:  Discontinued     400 mg 200 mL/hr over 60 Minutes Intravenous  Once 12/17/17 2149 12/17/17 2203   12/17/17 2200  metroNIDAZOLE (FLAGYL) IVPB 500 mg     500 mg 100 mL/hr over 60 Minutes Intravenous  Once 12/17/17 2149 12/18/17 0206      Assessment/Plan: Acute acalculous cholecystitis with new finding of cirrhosis -IR has seen patient.  Percutaneous cholecystostomy 12/19/17. - Much improved -cont abx therapy, maxipime  and flagyl.  WBC down to 11.9 today from 21K. -may have clear liquids AFTER drain placement; advance as tolerated -cirrhosis, per GI  New onset a fib Per medicine and cardiology     FEN - NPO for procedure VTE - SCDs/heparin gtt DC  ID - maxipime/flagyl    LOS: 3 days    Maia Petties 12/20/2017

## 2017-12-20 NOTE — Progress Notes (Signed)
Physical Therapy Treatment Patient Details Name: Lance Martin MRN: [id number] DOB: 1932-09-02 Today's Date: 12/20/2017    History of Present Illness This 84 y.o. male admitted with nausea, abdominal pain,  as well as generalized weakness.  He was found to have acute cholecystitis, and new diagnosis of cirrhosis.  PMH:  Gout, Rt shoulder impingement syndrome, h/o SDH, prostate CA, s/p Lt THA      PT Comments    Pt admitted with above diagnosis. Pt currently with functional limitations due to balance and endurance deficits. Pt was able to ambulate with mod assist of 2 persons only 15 feet.  Poor gait stability.  Pt also incontinent of bowels.  Feel that [place] is a better option for pt.  Updated d/c plan.  Will follow acutely.   Pt will benefit from skilled PT to increase their independence and safety with mobility to allow discharge to the venue listed below.     Follow Up Recommendations  Supervision/Assistance - 24 hour;SNF     Equipment Recommendations  None recommended by PT    Recommendations for Other Services       Precautions / Restrictions Precautions Precautions: Fall Restrictions Weight Bearing Restrictions: No    Mobility  Bed Mobility Overal bed mobility: Needs Assistance Bed Mobility: Rolling;Sidelying to Sit Rolling: Mod assist;+2 for physical assistance Sidelying to sit: Mod assist;+2 for physical assistance       General bed mobility comments: Pt lying in large BM on arrival. Soiled the bed sheets, gown and pad under pt.  Cleaned pt thoroughly and decided to assiste pt onto 3N1 as pt kept having BM.  Pt needed  step by cues for sequencing.  Assist to initiate moving LEs off bed and assist to lift trunk from bed   Transfers Overall transfer level: Needs assistance Equipment used: Rolling walker (2 wheeled) Transfers: Sit to/from [facility] Sit to Stand: Mod assist;+2 physical assistance Stand pivot transfers: Mod assist;+2 physical  assistance       General transfer comment: assist to power up into standing, assist for balance, and assist to maneuver RW.  Needed mod assist to steady while standing.    Ambulation/Gait Ambulation/Gait assistance: Mod assist;+2 physical assistance Ambulation Distance (Feet): 15 Feet Assistive device: Rolling walker (2 wheeled) Gait Pattern/deviations: Step-to pattern;Decreased stride length;Shuffle;Trunk flexed;Leaning posteriorly;Staggering left;Staggering right;Narrow base of support;Decreased step length - right;Decreased step length - left   Gait velocity interpretation: <1.31 ft/sec, indicative of household ambulator General Gait Details: very slow to initiate stepping and needed encouragement to continue, difficulty getting any clearance for stepping, more of a shuffling, scooting pattern with lots of hesitation to lift his feet, improved some over time   Stairs             Wheelchair Mobility    Modified Rankin (Stroke Patients Only)       Balance Overall balance assessment: Needs assistance Sitting-balance support: Feet supported;No upper extremity supported Sitting balance-Leahy Scale: Fair Sitting balance - Comments: static sitting with min guard assist EOB    Standing balance support: Bilateral upper extremity supported Standing balance-Leahy Scale: Poor Standing balance comment: requires bil. UE support and mod A                             Cognition Arousal/Alertness: Awake/alert Behavior During Therapy: Flat affect Overall Cognitive Status: Impaired/Different from baseline Area of Impairment: Attention;Following commands;Problem solving  Current Attention Level: Selective   Following Commands: Follows one step commands consistently;Follows one step commands inconsistently     Problem Solving: Slow processing;Decreased initiation;Difficulty sequencing;Requires verbal cues;Requires tactile cues General Comments:  Pt has hearing loss, making it a bit difficult to accurately assess cognition.  He is slow to initiate movement, and demonstrates difficulty with sequencing activity       Exercises General Exercises - Lower Extremity Ankle Circles/Pumps: AROM;Both;10 reps;Supine Long Arc Quad: AROM;Both;10 reps;Seated    General Comments General comments (skin integrity, edema, etc.): daughter present and had discussion with pt and daughter as pt lives with son who has come mental issues.  Concerned as pt has been incontinent and requiring +2 mod assist and feel that sonmay have difficulty caring for pt on d/c. Will probably need SNF.       Pertinent Vitals/Pain Pain Assessment: Faces Faces Pain Scale: Hurts even more Pain Location: R abdomen Pain Descriptors / Indicators: Aching;Grimacing Pain Intervention(s): Limited activity within patient's tolerance;Monitored during session;Premedicated before session;Repositioned  VSS  Home Living                      Prior Function            PT Goals (current goals can now be found in the care plan section) Acute Rehab PT Goals Patient Stated Goal: to regain independence  Progress towards PT goals: Progressing toward goals    Frequency    Min 3X/week      PT Plan Discharge plan needs to be updated    Co-evaluation              AM-PAC PT "6 Clicks" Daily Activity  Outcome Measure  Difficulty turning over in bed (including adjusting bedclothes, sheets and blankets)?: A Lot Difficulty moving from lying on back to sitting on the side of the bed? : Unable Difficulty sitting down on and standing up from a chair with arms (e.g., wheelchair, bedside commode, etc,.)?: Unable Help needed moving to and from a bed to chair (including a wheelchair)?: A Lot Help needed walking in hospital room?: A Lot Help needed climbing 3-5 steps with a railing? : Total 6 Click Score: 9    End of Session Equipment Utilized During Treatment: Gait  belt Activity Tolerance: Patient limited by fatigue Patient left: with call bell/phone within reach;in chair;with family/visitor present;with chair alarm set Nurse Communication: Mobility status(nurse aware that pt had BM and to check after lunch ) PT Visit Diagnosis: Other abnormalities of gait and mobility (R26.89)     Time: 3474-2595 PT Time Calculation (min) (ACUTE ONLY): 54 min  Charges:  $Gait Training: 8-22 mins $Therapeutic Exercise: 8-22 mins $Therapeutic Activity: 23-37 mins                    G Codes:       [place] Shefali Ng,PT Acute Rehabilitation [phone] (pager)    Denice Paradise 12/20/2017, 2:27 PM

## 2017-12-20 NOTE — Progress Notes (Signed)
PHARMACY NOTE:  ANTIMICROBIAL RENAL DOSAGE ADJUSTMENT  Current antimicrobial regimen includes a mismatch between antimicrobial dosage and estimated renal function.  As per policy approved by the Pharmacy & Therapeutics and Medical Executive Committees, the antimicrobial dosage will be adjusted accordingly.  Current antimicrobial dosage:  Cefepime 2 gram IV q12 hours  Indication: cholecystitis/sepsis  Renal Function:   Estimated Creatinine Clearance: 41.3 mL/min (A) (by C-G formula based on SCr of 1.31 mg/dL (H)).     Antimicrobial dosage has been changed to:  Cefepime 2 gram IV q 24 hours  Thank you for allowing pharmacy to be a part of this patient's care.  Charlton Haws, [facility] 12/20/2017 2:41 PM

## 2017-12-20 NOTE — Progress Notes (Signed)
Referring Physician(s): DR Jonny Ruiz  Supervising Physician: Corrie Mckusick  Patient Status:  [facility] - In-pt  Chief Complaint:  Perc chole drain placed 5/10  Subjective:  Feeling better already OP 225 cc yesterday Bilious Eating clears Denies pain  Allergies: Allopurinol and Ampicillin  Medications: Prior to Admission medications   Medication Sig Start Date End Date Taking? Authorizing Provider  amLODipine (NORVASC) 2.5 MG tablet Take 2.5 mg by mouth every evening.  11/25/16  Yes [provider]  atorvastatin (LIPITOR) 40 MG tablet Take 40 mg by mouth daily.   Yes [provider]  benazepril (LOTENSIN) 40 MG tablet Take 40 mg by mouth at bedtime.    Yes [provider]  calcium-vitamin D (OSCAL WITH D) 500-200 MG-UNIT tablet Take 1 tablet by mouth.   Yes [provider]  cetirizine (ZYRTEC) 10 MG tablet Take 10 mg by mouth daily.   Yes [provider]  cholecalciferol (VITAMIN D) 1000 units tablet Take 1,000 Units by mouth daily.   Yes [provider]  Coenzyme Q10 (COQ-10) 100 MG CAPS Take 1 Can by mouth as needed.    Yes [provider]  Flaxseed, Linseed, (FLAXSEED OIL PO) Take 1,400 mg by mouth daily. Will stop prior to procedure   Yes [provider]  fluticasone (FLONASE) 50 MCG/ACT nasal spray Place 2 sprays into the nose daily. ALLERGIES   Yes [provider]  metoprolol succinate (TOPROL-XL) 25 MG 24 hr tablet Take 25 mg by mouth daily.   Yes [provider]  Multiple Vitamins-Minerals (MULTIVITAMINS THER. W/MINERALS) TABS Take 1 tablet by mouth daily.    Yes [provider]  TURMERIC PO Take 1,000 mg by mouth 2 (two) times daily.   Yes [provider]  Febuxostat 80 MG TABS Take 80 mg by mouth daily.     [provider]  omeprazole (PRILOSEC) 40 MG capsule Take 1 capsule (40 mg total) by mouth daily. 09/04/17   Zehr, Laban Emperor, PA-C     Vital  Signs: BP 131/69 (BP Location: Left Arm)   Pulse 92   Temp 97.6 F (36.4 C) (Oral)   Resp 20   Ht 5\' 8"  (1.727 m)   Wt 153 lb 14.4 oz (69.8 kg)   SpO2 97%   BMI 23.40 kg/m   Physical Exam  Abdominal: Soft. Bowel sounds are normal.  Neurological: He is alert.  Skin: Skin is warm and dry.  Site is clean and dry NT no bleeding OP 325 cc yesterday 40 cc in bag Bile  Afeb  Nursing note and vitals reviewed.   Imaging: Dg Chest 2 View  Result Date: 12/17/2017 CLINICAL DATA:  Sepsis EXAM: CHEST - 2 VIEW COMPARISON:  01/16/2014 FINDINGS: Mild interstitial edema. Heart size and mediastinal contours are stable with minimal aortic atherosclerosis. No pulmonary consolidation. No acute osseous abnormality. IMPRESSION: Mild interstitial edema. No acute pulmonary consolidation. Aortic atherosclerosis. Electronically Signed   By: Ashley Royalty M.D.   On: 12/17/2017 23:05   Mr Abdomen Mrcp Wo Contrast  Result Date: 12/18/2017 CLINICAL DATA:  Right upper quadrant pain and jaundice. Abnormal liver and gallbladder on ultrasound. EXAM: MRI ABDOMEN WITHOUT CONTRAST  (INCLUDING MRCP) TECHNIQUE: Multiplanar multisequence MR imaging of the abdomen was performed. Heavily T2-weighted images of the biliary and pancreatic ducts were obtained, and three-dimensional MRCP images were rendered by post processing. COMPARISON:  Ultrasound on 12/17/2017 FINDINGS: Lower chest: No acute findings. Hepatobiliary: Image degradation by motion artifact noted. Hepatic cirrhosis  is demonstrated. No hepatic mass visualized on this unenhanced exam. The gallbladder is dilated and shows moderate diffuse wall thickening and pericholecystic edema. Sludge is also seen within the gallbladder lumen. These findings are greater than typically seen with cirrhosis, and acute cholecystitis cannot be excluded. There is no evidence of biliary ductal dilatation, with common bile duct measuring approximately 5 mm. Pancreas: No mass or inflammatory  process visualized on this unenhanced exam. No evidence of pancreatic ductal dilatation. Spleen:  Within normal limits in size. Adrenals/Urinary tract: Tiny right renal cyst noted. No evidence of hydronephrosis. Stomach/Bowel: Visualized portion unremarkable. Vascular/Lymphatic: No pathologically enlarged lymph nodes identified. No evidence of abdominal aortic aneurysm. Other:  Mild ascites. Musculoskeletal:  No suspicious bone lesions identified. IMPRESSION: Hepatic cirrhosis and mild ascites. Dilated gallbladder with sludge, moderate diffuse wall thickening and pericholecystic edema. These findings are greater than typically seen with cirrhosis, and acute cholecystitis cannot be excluded. If clinically warranted, nuclear medicine hepatobiliary scan could be performed for further evaluation. No evidence of biliary ductal dilatation. Electronically Signed   By: Earle Gell M.D.   On: 12/18/2017 07:32   Mr 3d Recon At Scanner  Result Date: 12/18/2017 CLINICAL DATA:  Right upper quadrant pain and jaundice. Abnormal liver and gallbladder on ultrasound. EXAM: MRI ABDOMEN WITHOUT CONTRAST  (INCLUDING MRCP) TECHNIQUE: Multiplanar multisequence MR imaging of the abdomen was performed. Heavily T2-weighted images of the biliary and pancreatic ducts were obtained, and three-dimensional MRCP images were rendered by post processing. COMPARISON:  Ultrasound on 12/17/2017 FINDINGS: Lower chest: No acute findings. Hepatobiliary: Image degradation by motion artifact noted. Hepatic cirrhosis is demonstrated. No hepatic mass visualized on this unenhanced exam. The gallbladder is dilated and shows moderate diffuse wall thickening and pericholecystic edema. Sludge is also seen within the gallbladder lumen. These findings are greater than typically seen with cirrhosis, and acute cholecystitis cannot be excluded. There is no evidence of biliary ductal dilatation, with common bile duct measuring approximately 5 mm. Pancreas: No mass or  inflammatory process visualized on this unenhanced exam. No evidence of pancreatic ductal dilatation. Spleen:  Within normal limits in size. Adrenals/Urinary tract: Tiny right renal cyst noted. No evidence of hydronephrosis. Stomach/Bowel: Visualized portion unremarkable. Vascular/Lymphatic: No pathologically enlarged lymph nodes identified. No evidence of abdominal aortic aneurysm. Other:  Mild ascites. Musculoskeletal:  No suspicious bone lesions identified. IMPRESSION: Hepatic cirrhosis and mild ascites. Dilated gallbladder with sludge, moderate diffuse wall thickening and pericholecystic edema. These findings are greater than typically seen with cirrhosis, and acute cholecystitis cannot be excluded. If clinically warranted, nuclear medicine hepatobiliary scan could be performed for further evaluation. No evidence of biliary ductal dilatation. Electronically Signed   By: Earle Gell M.D.   On: 12/18/2017 07:32   Ir Perc Cholecystostomy  Result Date: 12/19/2017 INDICATION: 84 year old male with acute cholecystitis (sludge, no definite stones). He is currently a poor operative candidate in the setting of hepatic cirrhosis. He presents for percutaneous cholecystostomy tube placement. EXAM: CHOLECYSTOSTOMY MEDICATIONS: In patient currently receiving intravenous cefepime and Flagyl. No additional antibiotic prophylaxis administered. ANESTHESIA/SEDATION: Moderate (conscious) sedation was employed during this procedure. A total of Versed 1 mg and Fentanyl 50 mcg was administered intravenously. Moderate Sedation Time: 13 minutes. The patient's level of consciousness and vital signs were monitored continuously by radiology nursing throughout the procedure under my direct supervision. FLUOROSCOPY TIME:  Fluoroscopy Time: 1 minutes 0 seconds (21 mGy). COMPLICATIONS: None immediate. PROCEDURE: Informed written consent was obtained from the patient after a thorough discussion of the procedural risks,  benefits and  alternatives. All questions were addressed. Maximal Sterile Barrier Technique was utilized including caps, mask, sterile gowns, sterile gloves, sterile drape, hand hygiene and skin antiseptic. A timeout was performed prior to the initiation of the procedure. The right upper quadrant was interrogated with ultrasound. The right hemidiaphragm is elevated. The liver sits high in the right upper quadrant. The distended and inflamed gallbladder containing sludge was successfully identified. There is a small volume of perihepatic ascites. A suitable skin entry site was selected and marked. Local anesthesia was attained by infiltration with 1% lidocaine. A small dermatotomy was made. Under real-time sonographic guidance, the gallbladder lumen was punctured with a 21 gauge Accustick needle along a short transhepatic course. The 0.018 wire was then coiled in the gallbladder lumen. The needle was exchanged over the wire for the Accustick sheath which was advanced into the gallbladder. A gentle hand injection of contrast material confirms the Accustick sheath is within the gallbladder lumen. There is extensive debris within the lumen and cobblestoning of the mucosa. A Bentson wire was then coiled in the gallbladder lumen. The Accustick sheath was removed. The percutaneous tract was dilated to 10 [place] and a Cook 10.2 [place] all-purpose drainage catheter was advanced over the wire and formed in the gallbladder lumen. Aspiration was performed yielding approximately 60 mL of thick, purulent bile. A sample was sent for Gram stain and culture. The cholecystostomy tube was gently flushed and then connected to gravity bag drainage. The catheter was secured to the skin with 0 Prolene suture. The patient tolerated the procedure well. IMPRESSION: Successful placement of a percutaneous transhepatic cholecystostomy tube for the treatment of acute cholecystitis (sludge, no definite stones on imaging). PLAN: 1. Maintain tube to gravity bag  drainage. 2. Given patient's underlying cirrhosis, he is unlikely to be common optimal surgical candidate in the future. 3. IR drain clinic in 4 weeks for cholangiogram through existing tube. If the cystic duct regains patency, the percutaneous drainage catheter could be removed after 6 weeks given that there is no definitive evidence of cholelithiasis. Signed, Criselda Peaches, MD [facility] Radiology Electronically Signed   By: Jacqulynn Cadet M.D.   On: 12/19/2017 16:13   US Abdomen Limited Ruq  Result Date: 12/17/2017 CLINICAL DATA:  Right upper quadrant abdominal pain and jaundice. EXAM: ULTRASOUND ABDOMEN LIMITED RIGHT UPPER QUADRANT COMPARISON:  None. FINDINGS: Gallbladder: Tumefactive biliary sludge is identified within the gallbladder. The single wall thickness of the gallbladder is 4.2 mm, normal less than 3 mm. No pericholecystic fluid. No sonographic Murphy sign noted by sonographer. Common bile duct: Diameter: 5 mm Liver: No focal lesion identified given limitations due to the patient's inability to adequately position for better images. Within normal limits in parenchymal echogenicity. Portal vein is patent on color Doppler imaging though indeterminate in direction of blood flow given mixed color signal noted within. Nodular liver surface compatible with cirrhosis. Adjacent small volume of ascites. IMPRESSION: 1. Thick-walled gallbladder with biliary sludge noted within. 2. Cirrhotic appearing liver with adjacent small volume of perihepatic ascites. Mixed color Doppler signal within the imaged portal venous system may represent early changes of portal hypertension though the portal vein is patent. Electronically Signed   By: Ashley Royalty M.D.   On: 12/17/2017 19:55    Labs:  CBC: Recent Labs    12/18/17 0651 12/18/17 0935 12/19/17 0308 12/20/17 0542  WBC 21.5* 21.3* 17.9* 11.9*  HGB 12.1* 12.2* 11.4* 11.8*  HCT 33.8* 34.8* 31.7*  33.5*  PLT 160 146* 161 177    COAGS: Recent Labs    12/17/17 2123 12/18/17 0651 12/19/17 0647  INR 1.45 1.72 1.76  APTT 38* 189*  --     BMP: Recent Labs    12/17/17 1823 12/18/17 0651 12/19/17 0308 12/20/17 0542  NA 134* 135 136 139  K 3.6 3.2* 3.0* 4.0  CL 98* 100* 105 110  CO2 22 22 22  20*  GLUCOSE 97 77 78 87  BUN 47* 43* 37* 31*  CALCIUM 8.4* 8.2* 7.8* 8.0*  CREATININE 1.98* 1.78* 1.58* 1.31*  GFRNONAA 30* 34* 39* 49*  GFRAA 34* 39* 45* 56*    LIVER FUNCTION TESTS: Recent Labs    12/17/17 1823 12/18/17 0651 12/19/17 0308  BILITOT 5.4* 5.4* 4.5*  AST 105* 86* 80*  ALT 64* 56 49  ALKPHOS 196* 167* 183*  PROT 5.7* 5.2* 4.9*  ALBUMIN 2.6* 2.4* 2.1*    Assessment and Plan:  Percutaneous cholecystostomy drain intact Draining well No pain Will need follow up in IR OP Clinic 6 weeks Flush at home daily 5cc sterile saline Record OP He will hear from scheduler for time and date   Electronically Signed: Deepika Decatur A, PA-C 12/20/2017, 9:37 AM   I spent a total of 15 Minutes at the the patient's bedside AND on the patient's hospital floor or unit, greater than 50% of which was counseling/coordinating care for perc chole drain

## 2017-12-20 NOTE — Progress Notes (Signed)
Primary Cardiologist Dr. Radford Pax   Subjective:  Denies SSCP, palpitations or Dyspnea Taking PO   Objective:  Vitals:   12/19/17 2252 12/19/17 2322 12/20/17 0322 12/20/17 0523  BP: (!) 127/55 135/70 127/65 131/69  Pulse: (!) 101 96 90 92  Resp: (!) 28 (!) 26 18 20   Temp:    97.6 F (36.4 C)  TempSrc:    Oral  SpO2: 100% 97% 96% 97%  Weight:    153 lb 14.4 oz (69.8 kg)  Height:        Intake/Output from previous day:  Intake/Output Summary (Last 24 hours) at 12/20/2017 0956 Last data filed at 12/20/2017 0933 Gross per 24 hour  Intake 2105 ml  Output 1025 ml  Net 1080 ml    Physical Exam: Affect appropriate Healthy:  appears stated age HEENT: normal Neck supple with no adenopathy JVP normal no bruits no thyromegaly Lungs clear with no wheezing and good diaphragmatic motion Heart:  S1/S2 no murmur, no rub, gallop or click PMI normal Abdomen:cholycystostomy tube in RUQ draining well  Distal pulses intact with no bruits No edema Neuro non-focal Skin warm and dry No muscular weakness   Lab Results: Basic Metabolic Panel: Recent Labs    12/18/17 0651 12/19/17 0308 12/20/17 0542  NA 135 136 139  K 3.2* 3.0* 4.0  CL 100* 105 110  CO2 22 22 20*  GLUCOSE 77 78 87  BUN 43* 37* 31*  CREATININE 1.78* 1.58* 1.31*  CALCIUM 8.2* 7.8* 8.0*  MG 1.8 1.8 1.9  PHOS 3.8  --   --    Liver Function Tests: Recent Labs    12/18/17 0651 12/19/17 0308  AST 86* 80*  ALT 56 49  ALKPHOS 167* 183*  BILITOT 5.4* 4.5*  PROT 5.2* 4.9*  ALBUMIN 2.4* 2.1*   Recent Labs    12/17/17 1823  LIPASE 121*   CBC: Recent Labs    12/17/17 1521  12/19/17 0308 12/20/17 0542  WBC 18.2*   < > 17.9* 11.9*  NEUTROABS 14.0  --  14.3*  --   HGB 13.6   < > 11.4* 11.8*  HCT 37.7*   < > 31.7* 33.5*  MCV 97.9   < > 96.9 98.5  PLT 176   < > 161 177   < > = values in this interval not displayed.   Cardiac Enzymes: Recent Labs    12/17/17 2123 12/18/17 0651 12/18/17 0935    TROPONINI 0.10* 0.03* <0.03    Recent Labs    12/18/17 0651  TSH 1.069   Anemia Panel: No results for input(s): VITAMINB12, FOLATE, FERRITIN, TIBC, IRON, RETICCTPCT in the last 72 hours.  Imaging: Ir Perc Cholecystostomy  Result Date: 12/19/2017 INDICATION: 84 year old male with acute cholecystitis (sludge, no definite stones). He is currently a poor operative candidate in the setting of hepatic cirrhosis. He presents for percutaneous cholecystostomy tube placement. EXAM: CHOLECYSTOSTOMY MEDICATIONS: In patient currently receiving intravenous cefepime and Flagyl. No additional antibiotic prophylaxis administered. ANESTHESIA/SEDATION: Moderate (conscious) sedation was employed during this procedure. A total of Versed 1 mg and Fentanyl 50 mcg was administered intravenously. Moderate Sedation Time: 13 minutes. The patient's level of consciousness and vital signs were monitored continuously by radiology nursing throughout the procedure under my direct supervision. FLUOROSCOPY TIME:  Fluoroscopy Time: 1 minutes 0 seconds (21 mGy). COMPLICATIONS: None immediate. PROCEDURE: Informed written consent was obtained from the patient after a thorough discussion of the procedural risks, benefits and alternatives. All questions were addressed. Maximal Sterile Barrier  Technique was utilized including caps, mask, sterile gowns, sterile gloves, sterile drape, hand hygiene and skin antiseptic. A timeout was performed prior to the initiation of the procedure. The right upper quadrant was interrogated with ultrasound. The right hemidiaphragm is elevated. The liver sits high in the right upper quadrant. The distended and inflamed gallbladder containing sludge was successfully identified. There is a small volume of perihepatic ascites. A suitable skin entry site was selected and marked. Local anesthesia was attained by infiltration with 1% lidocaine. A small dermatotomy was made. Under real-time sonographic guidance, the  gallbladder lumen was punctured with a 21 gauge Accustick needle along a short transhepatic course. The 0.018 wire was then coiled in the gallbladder lumen. The needle was exchanged over the wire for the Accustick sheath which was advanced into the gallbladder. A gentle hand injection of contrast material confirms the Accustick sheath is within the gallbladder lumen. There is extensive debris within the lumen and cobblestoning of the mucosa. A Bentson wire was then coiled in the gallbladder lumen. The Accustick sheath was removed. The percutaneous tract was dilated to 10 [place] and a Cook 10.2 [place] all-purpose drainage catheter was advanced over the wire and formed in the gallbladder lumen. Aspiration was performed yielding approximately 60 mL of thick, purulent bile. A sample was sent for Gram stain and culture. The cholecystostomy tube was gently flushed and then connected to gravity bag drainage. The catheter was secured to the skin with 0 Prolene suture. The patient tolerated the procedure well. IMPRESSION: Successful placement of a percutaneous transhepatic cholecystostomy tube for the treatment of acute cholecystitis (sludge, no definite stones on imaging). PLAN: 1. Maintain tube to gravity bag drainage. 2. Given patient's underlying cirrhosis, he is unlikely to be common optimal surgical candidate in the future. 3. IR drain clinic in 4 weeks for cholangiogram through existing tube. If the cystic duct regains patency, the percutaneous drainage catheter could be removed after 6 weeks given that there is no definitive evidence of cholelithiasis. Signed, Criselda Peaches, MD [facility] Radiology Electronically Signed   By: Jacqulynn Cadet M.D.   On: 12/19/2017 16:13    Cardiac Studies:  ECG:  Orders placed or performed during the hospital encounter of 12/17/17  . EKG 12-Lead  . EKG 12-Lead  . EKG 12-Lead  . EKG 12-Lead  . EKG 12-Lead      Telemetry:  Echo: 12/18/17 EF 65-70%  Medications:   . lactulose  10 g Oral Daily  . metoprolol tartrate  2.5 mg Intravenous Q6H  . potassium chloride  40 mEq Oral Daily  . sodium chloride flush  5 mL Intracatheter Q8H     . sodium chloride 75 mL/hr (12/20/17 0542)  . ceFEPime (MAXIPIME) IV 2 g (12/20/17 0933)  . metronidazole Stopped (12/20/17 0700)    Assessment/Plan:  SVT/Atrial Arrhythmia:  Will start low dose oral beta blocker NSR this am no evidence of PAF GI:  Post tube drainage of GB with no cardiac complications will need f/u with IR Continue antibiotics per IM  Jenkins Rouge 12/20/2017, 9:56 AM

## 2017-12-20 NOTE — Evaluation (Signed)
Clinical/Bedside Swallow Evaluation Patient Details  Name: Lance Martin MRN: [id number] Date of Birth: 06/08/1933  Today's Date: 12/20/2017 Time: SLP Start Time (ACUTE ONLY): 62 SLP Stop Time (ACUTE ONLY): 1055 SLP Time Calculation (min) (ACUTE ONLY): 15 min  Past Medical History:  Past Medical History:  Diagnosis Date  . Arthritis   . AV block 05/13 ecg   1st degree  . Bleeding    bladder,radiation cystitis-s/p cystoscopy, clot evacuation, fulgeration of bleeders  . Cancer ([place])    prostate, hx of bsal cell skin cancer   . Cataract   . Gout   . Hematuria   . Hemorrhoid   . History of basal cell carcinoma excision 08-29-2011  left frontal scalp  . History of gout PER PT -  STABLE  . History of prostate cancer 2004   -  RX EXTERNAL RADIATION  . Hx of subdural hematoma 08-14-2011    s/p total left hip  12-92012---  resolved w/ no residual  . Hyperlipidemia   . Hypertension   . IFG (impaired fasting glucose)   . Lab findings of hypoxemia    abnormal ONO , dr perini. 05-26-2012  . Microalbuminuria 2011  . OA (osteoarthritis) of hip   . Obstructive apnea    AHI of 35 on 01-14-13 , titrated to 6 cm H20  . Pneumonia    hx of 2015   . Shoulder impingement syndrome    right  . Sleep apnea    cpap nightly  . Sleep apnea with use of continuous positive airway pressure (CPAP) 03/19/2013   Past Surgical History:  Past Surgical History:  Procedure Laterality Date  . CARDIAC CATHETERIZATION     pt denies  . COLONOSCOPY    . CYSTOSCOPY WITH BIOPSY  07/03/2012   Procedure: CYSTOSCOPY WITH BIOPSY;  Surgeon: Franchot Gallo, MD;  Location: [facility];  Service: Urology;  Laterality: N/A;      . CYSTOSCOPY WITH FULGERATION N/A 12/02/2016   Procedure: CYSTOSCOPY WITH FULGERATION OF BLEEDERS AND TRANSURETRAL INCISION OF PROSTATE;  Surgeon: Franchot Gallo, MD;  Location: [facility];  Service: Urology;  Laterality: N/A;  . EYE SURGERY  2009   bilateral cataract surgery    . IR PERC CHOLECYSTOSTOMY  12/19/2017  . JOINT REPLACEMENT    . MOHS SURGERY    . PROSTATE SURGERY    . RIGHT ELBOW SURGERY  MAR 2013  . right elbow surgery  2009   ELBOW AND WRIST SURGERY  . TONSILLECTOMY     AGE 84  . TOTAL HIP ARTHROPLASTY  07/31/2011   Procedure: TOTAL HIP ARTHROPLASTY;  Surgeon: Dione Plover Aluisio;  Location: [facility];  Service: Orthopedics;  Laterality: Left;  . TOTAL HIP ARTHROPLASTY    . TRANSURETHRAL RESECTION OF PROSTATE  07/25/2012   Procedure: TRANSURETHRAL RESECTION OF THE PROSTATE WITH GYRUS INSTRUMENTS;  Surgeon: Alexis Frock, MD;  Location: [facility];  Service: Urology;  Laterality: N/A;  CYSTOSCOPY;CLOT EVACUATION WITH FULGERATION  . ULNAR NERVE REPAIR  04/13   HPI:  Pt. with PMH of HTN, HLD, gout, prostate cancer, hemorrhagic cystitis S/P fulguration, GERD; admitted on 12/17/2017, presented with complaint of abdominal pain, was found to have acute cholecystitis, s/p percutaneous cholecystostomy 12/19/17; on clears per surgery to be advanced as tolerated. Per GI pt with recent trouble swallowing with increased regurgitation prior to admission. GI recommending dysphagia w/u with esophagram, likely next week.    Assessment / Plan / Recommendation Clinical Impression   Patient presents with questionable signs  of dysphagia, with multiple swallows noted after all consistencies tested (thin, nectar thick liquids and puree). Pt's respiratory rate fluctuating in the high 20s to low 30s when repositioned upright and continued to fluctuate during assessment but without clear relationship to PO intake. Would continue with clear liquids for now; SLP will follow up next date for tolerance, possible advancement and to determine need for further diagnostic testing (MBS) to rule out oropharyngeal dysphagia and to screen for need for further esophageal w/u. SLP Visit Diagnosis: Dysphagia, unspecified (R13.10)    Aspiration Risk  Mild aspiration risk    Diet Recommendation Thin  liquid   Liquid Administration via: Cup;Straw Medication Administration: Whole meds with liquid Supervision: Patient able to self feed Compensations: Slow rate;Small sips/bites Postural Changes: Seated upright at 90 degrees;Remain upright for at least 30 minutes after po intake    Other  Recommendations Oral Care Recommendations: Oral care BID   Follow up Recommendations Other (comment)(tba)      Frequency and Duration min 2x/week  2 weeks       Prognosis Prognosis for Safe Diet Advancement: Good Barriers to Reach Goals: Other (Comment)(comorbidities)      Swallow Study   General Date of Onset: 12/17/17 HPI: Pt. with PMH of HTN, HLD, gout, prostate cancer, hemorrhagic cystitis S/P fulguration, GERD; admitted on 12/17/2017, presented with complaint of abdominal pain, was found to have acute cholecystitis, s/p percutaneous cholecystostomy 12/19/17; on clears per surgery to be advanced as tolerated. Per GI pt with recent trouble swallowing with increased regurgitation prior to admission. GI recommending dysphagia w/u with esophagram, likely next week.  Type of Study: Bedside Swallow Evaluation Previous Swallow Assessment: none in chart Diet Prior to this Study: Thin liquids(clear liquids) Temperature Spikes Noted: No Respiratory Status: Room air History of Recent Intubation: No Behavior/Cognition: Alert;Cooperative Oral Cavity Assessment: Within Functional Limits Oral Care Completed by SLP: No Oral Cavity - Dentition: Adequate natural dentition Vision: Functional for self-feeding Self-Feeding Abilities: Able to feed self Patient Positioning: Upright in bed Baseline Vocal Quality: Normal Volitional Cough: Strong Volitional Swallow: Able to elicit    Oral/Motor/Sensory Function Overall Oral Motor/Sensory Function: Within functional limits   Ice Chips Ice chips: Not tested   Thin Liquid Thin Liquid: Impaired Pharyngeal  Phase Impairments: Multiple swallows    Nectar Thick  Nectar Thick Liquid: Impaired Pharyngeal Phase Impairments: Multiple swallows   Honey Thick Honey Thick Liquid: Not tested   Puree Puree: Impaired Pharyngeal Phase Impairments: Multiple swallows   Solid   GO   Solid: Not tested       Deneise Lever, [place], CCC-SLP Speech-Language Pathologist [phone]  Aliene Altes 12/20/2017,11:02 AM

## 2017-12-20 NOTE — Progress Notes (Signed)
[facility] Progress Note  Patient: Lance Martin YHC:[id number]   PCP: Crist Infante, MD DOB: 1933-03-31   DOA: 12/17/2017   DOS: 12/20/2017   Date of Service: the patient was seen and examined on 12/20/2017  Subjective: Patient is awake, having some complain of right pleuritic chest pain when he is coughing.  No nausea no vomiting no fever chills.  He mentions his cough is chronic but is worsened since yesterday.  Brief hospital course: Pt. with PMH of HTN, HLD, gout, prostate cancer, hemorrhagic cystitis S/P fulguration, GERD; admitted on 12/17/2017, presented with complaint of abdominal pain, was found to have acute cholecystitis. Currently further plan is conservative management.  Assessment and Plan: 1.  Acute cholecystitis. Presented with abdominal pain, GI was consulted, MRCP was performed, CBD did not show any stone, there were also no gallstones.  Gallbladder was thickened. GI currently has signed off recommending no further work-up from their perspective. Recommend to continue IV antibiotics. General surgery was consulted by GI for cholecystitis, although surgery feels that the patient would be at high risk and therefore recommends percutaneous cholecystostomy tube placement, tolerated well. Continue IV antibiotics for now.  2.  Elevated troponin. Cardiology was consulted. Preoperative evaluation suggest patient will be at moderate risk for surgery. Elevated troponin likely demand ischemia and A. fib. No further work-up recommended.  3.  Sinus tachycardia. Likely in the response of infection. Initially thought to be A. fib on admission although it appears that PAC burden rather than active A. fib.  Started on beta-blocker.  Monitor. Patient was started on anticoagulation. Discontinue heparin.  4. Essential hypertension. Blood pressure soft. Patient on 3 blood pressure medications at home, currently on hold.  Rate control with beta-blocker initiated.  5.  New diagnosis of  cirrhosis. As per GI likely combination of alcohol induced and [place]. Outpatient follow-up recommended by GI. CPC class C, MELD 24. Recommend alcohol cessation.  6.  Cough. Chest x-ray performed to verify the position of the cholecystostomy tube. Overlies right upper abdominal quadrant. Will initiate on incentive spirometry.  Diet: NPO DVT Prophylaxis: subcutaneous Heparin  Advance goals of care discussion: dnr dni  Family Communication: no family was present at bedside, at the time of interview.  Disposition:  Discharge to be determined.  Consultants: gastroenterology General surgery IR Procedures: none  Antibiotics: Anti-infectives (From admission, onward)   Start     Dose/Rate Route Frequency Ordered Stop   12/21/17 0933  ceFEPIme (MAXIPIME) 2 g in sodium chloride 0.9 % 100 mL IVPB     2 g 200 mL/hr over 30 Minutes Intravenous Every 24 hours 12/20/17 1440     12/18/17 1000  ceFEPIme (MAXIPIME) 2 g in sodium chloride 0.9 % 100 mL IVPB  Status:  Discontinued     2 g 200 mL/hr over 30 Minutes Intravenous Every 12 hours 12/17/17 2220 12/20/17 1440   12/18/17 0600  metroNIDAZOLE (FLAGYL) IVPB 500 mg     500 mg 100 mL/hr over 60 Minutes Intravenous Every 8 hours 12/17/17 2220     12/17/17 2215  ceFEPIme (MAXIPIME) 2 g in sodium chloride 0.9 % 100 mL IVPB     2 g 200 mL/hr over 30 Minutes Intravenous  Once 12/17/17 2203 12/18/17 0206   12/17/17 2200  ciprofloxacin (CIPRO) IVPB 400 mg  Status:  Discontinued     400 mg 200 mL/hr over 60 Minutes Intravenous  Once 12/17/17 2149 12/17/17 2203   12/17/17 2200  metroNIDAZOLE (FLAGYL) IVPB 500 mg  500 mg 100 mL/hr over 60 Minutes Intravenous  Once 12/17/17 2149 12/18/17 0206       Objective: Physical Exam: Vitals:   12/19/17 2322 12/20/17 0322 12/20/17 0523 12/20/17 1453  BP: 135/70 127/65 131/69 139/77  Pulse: 96 90 92 76  Resp: (!) 26 18 20    Temp:   97.6 F (36.4 C) (!) 97.4 F (36.3 C)  TempSrc:   Oral Oral  SpO2:  97% 96% 97% 99%  Weight:   69.8 kg (153 lb 14.4 oz)   Height:        Intake/Output Summary (Last 24 hours) at 12/20/2017 1628 Last data filed at 12/20/2017 1500 Gross per 24 hour  Intake 2605 ml  Output 1275 ml  Net 1330 ml   Filed Weights   12/18/17 0321 12/20/17 0523  Weight: 72.6 kg (160 lb) 69.8 kg (153 lb 14.4 oz)   General: Alert, Awake and Oriented to Time, Place and Person. Appear in mild distress, affect appropriate Eyes: PERRL, Conjunctiva normal ENT: Oral Mucosa clear moist. Neck: no JVD, no Abnormal Mass Or lumps Cardiovascular: S1 and S2 Present, aortic systolic Murmur, Peripheral Pulses Present Respiratory: normal respiratory effort, Bilateral Air entry equal and Decreased, no use of accessory muscle, Clear to Auscultation, no Crackles, no wheezes Abdomen: Bowel Sound present, Soft and mild tenderness, no hernia Skin: no redness, no Rash, no induration Extremities: no Pedal edema, no calf tenderness Neurologic: Grossly no focal neuro deficit. Bilaterally Equal motor strength  Data Reviewed: CBC: Recent Labs  Lab 12/17/17 1521 12/18/17 0651 12/18/17 0935 12/19/17 0308 12/20/17 0542  WBC 18.2* 21.5* 21.3* 17.9* 11.9*  NEUTROABS 14.0  --   --  14.3*  --   HGB 13.6 12.1* 12.2* 11.4* 11.8*  HCT 37.7* 33.8* 34.8* 31.7* 33.5*  MCV 97.9 97.7 97.2 96.9 98.5  PLT 176 160 146* 161 932   Basic Metabolic Panel: Recent Labs  Lab 12/17/17 1823 12/18/17 0651 12/19/17 0308 12/20/17 0542  NA 134* 135 136 139  K 3.6 3.2* 3.0* 4.0  CL 98* 100* 105 110  CO2 22 22 22  20*  GLUCOSE 97 77 78 87  BUN 47* 43* 37* 31*  CREATININE 1.98* 1.78* 1.58* 1.31*  CALCIUM 8.4* 8.2* 7.8* 8.0*  MG  --  1.8 1.8 1.9  PHOS  --  3.8  --   --     Liver Function Tests: Recent Labs  Lab 12/17/17 1823 12/18/17 0651 12/19/17 0308  AST 105* 86* 80*  ALT 64* 56 49  ALKPHOS 196* 167* 183*  BILITOT 5.4* 5.4* 4.5*  PROT 5.7* 5.2* 4.9*  ALBUMIN 2.6* 2.4* 2.1*   Recent Labs  Lab  12/17/17 1823  LIPASE 121*   Recent Labs  Lab 12/19/17 0308  AMMONIA 43*   Coagulation Profile: Recent Labs  Lab 12/17/17 2123 12/18/17 0651 12/19/17 0647  INR 1.45 1.72 1.76   Cardiac Enzymes: Recent Labs  Lab 12/17/17 2123 12/18/17 0651 12/18/17 0935  TROPONINI 0.10* 0.03* <0.03   BNP (last 3 results) No results for input(s): PROBNP in the last 8760 hours. CBG: No results for input(s): GLUCAP in the last 168 hours. Studies: Dg Chest Port 1 View  Result Date: 12/20/2017 CLINICAL DATA:  Cough.  History of hypertension. EXAM: PORTABLE CHEST 1 VIEW COMPARISON:  12/17/2017; 01/26/2014; 08/14/2011; ultrasound fluoroscopic guided cholecystostomy tube placement-12/19/2017 FINDINGS: Grossly unchanged cardiac silhouette and mediastinal contours with atherosclerotic plaque within thoracic aorta. The pulmonary vasculature remains indistinct with cephalization of flow. Grossly unchanged bibasilar opacities favored  to represent atelectasis. No new focal airspace opacities. No pleural effusion or pneumothorax. No acute osseus abnormalities. Cholecystostomy tube overlies the right upper abdominal quadrant. IMPRESSION: Suspected mild pulmonary edema, though note, atypical infection could have a similar appearance. Clinical correlation is advised. Electronically Signed   By: Sandi Mariscal M.D.   On: 12/20/2017 14:38    Scheduled Meds: . lactulose  10 g Oral Daily  . metoprolol tartrate  2.5 mg Intravenous Q6H  . potassium chloride  40 mEq Oral Daily  . sodium chloride flush  5 mL Intracatheter Q8H   Continuous Infusions: . sodium chloride 50 mL/hr at 12/20/17 1146  . [START ON 12/21/2017] ceFEPime (MAXIPIME) IV    . metronidazole Stopped (12/20/17 1422)   PRN Meds: acetaminophen, morphine injection, ondansetron **OR** ondansetron (ZOFRAN) IV, traMADol  Time spent: 25 minutes  Author: Berle Mull, MD [facility] Pager: [phone] 12/20/2017 4:28 PM  If 7PM-7AM, please  contact night-coverage at [email or web address], password [facility]

## 2017-12-21 ENCOUNTER — Inpatient Hospital Stay (HOSPITAL_COMMUNITY): Payer: Medicare Other

## 2017-12-21 DIAGNOSIS — I4891 Unspecified atrial fibrillation: Secondary | ICD-10-CM

## 2017-12-21 LAB — CBC
HCT: 33.4 % — ABNORMAL LOW (ref 39.0–52.0)
Hemoglobin: 11.6 g/dL — ABNORMAL LOW (ref 13.0–17.0)
MCH: 34 pg (ref 26.0–34.0)
MCHC: 34.7 g/dL (ref 30.0–36.0)
MCV: 97.9 fL (ref 78.0–100.0)
Platelets: 219 10*3/uL (ref 150–400)
RBC: 3.41 MIL/uL — ABNORMAL LOW (ref 4.22–5.81)
RDW: 15.3 % (ref 11.5–15.5)
WBC: 9.2 10*3/uL (ref 4.0–10.5)

## 2017-12-21 LAB — BASIC METABOLIC PANEL
Anion gap: 8 (ref 5–15)
BUN: 23 mg/dL — AB (ref 6–20)
CO2: 21 mmol/L — ABNORMAL LOW (ref 22–32)
CREATININE: 1.18 mg/dL (ref 0.61–1.24)
Calcium: 8.4 mg/dL — ABNORMAL LOW (ref 8.9–10.3)
Chloride: 113 mmol/L — ABNORMAL HIGH (ref 101–111)
GFR, EST NON AFRICAN AMERICAN: 55 mL/min — AB (ref >60)
GLUCOSE: 83 mg/dL (ref 65–99)
POTASSIUM: 4.1 mmol/L (ref 3.5–5.1)
SODIUM: 142 mmol/L (ref 135–145)

## 2017-12-21 LAB — MITOCHONDRIAL ANTIBODIES

## 2017-12-21 LAB — ANTI-SMOOTH MUSCLE ANTIBODY, IGG: F-Actin IgG: 5 Units (ref 0–19)

## 2017-12-21 MED ORDER — RESOURCE THICKENUP CLEAR PO POWD
ORAL | Status: DC | PRN
  Filled 2017-12-21: qty 125

## 2017-12-21 MED ORDER — METOPROLOL TARTRATE 25 MG PO TABS
25.0000 mg | ORAL_TABLET | Freq: Two times a day (BID) | ORAL | Status: DC
  Administered 2017-12-21 – 2017-12-23 (×5): 25 mg via ORAL
  Filled 2017-12-21 (×5): qty 1

## 2017-12-21 MED ORDER — METRONIDAZOLE 500 MG PO TABS
500.0000 mg | ORAL_TABLET | Freq: Three times a day (TID) | ORAL | Status: DC
  Administered 2017-12-21 – 2017-12-23 (×6): 500 mg via ORAL
  Filled 2017-12-21 (×6): qty 1

## 2017-12-21 MED ORDER — CEPHALEXIN 500 MG PO CAPS
500.0000 mg | ORAL_CAPSULE | Freq: Two times a day (BID) | ORAL | Status: DC
  Administered 2017-12-21 – 2017-12-23 (×4): 500 mg via ORAL
  Filled 2017-12-21 (×4): qty 1

## 2017-12-21 MED ORDER — POLYVINYL ALCOHOL 1.4 % OP SOLN
1.0000 [drp] | OPHTHALMIC | Status: DC | PRN
  Filled 2017-12-21: qty 15

## 2017-12-21 NOTE — Final Consult Note (Signed)
Consultant Final Sign-Off Note    Assessment/Final recommendations  Lance Martin is a 84 y.o. male followed by me for cholecystitis. He was treated with cholecystostomy tube which has resolved symptoms   Wound care (if applicable): empty drain once daily   Diet at discharge: per primary team   Activity at discharge: ad lib   Follow-up appointment:  F/u with Dr. Greer Pickerel in 4 weeks   Pending results:  Unresulted Labs (From admission, onward)   Start     Ordered   12/20/17 0500  CBC  Daily,   R    Question:  Specimen collection method  Answer:  Lab=Lab collect   12/19/17 1813   12/20/17 0109  Basic metabolic panel  Daily,   R    Question:  Specimen collection method  Answer:  Lab=Lab collect   12/19/17 1813   12/19/17 0500  Anti-smooth muscle antibody, IgG  Tomorrow morning,   R    Question:  Specimen collection method  Answer:  Lab=Lab collect   12/18/17 1352   12/19/17 0500  ANA, IFA (with reflex)  Tomorrow morning,   R    Question:  Specimen collection method  Answer:  Lab=Lab collect   12/18/17 1352   12/19/17 0500  Mitochondrial antibodies  Tomorrow morning,   R    Question:  Specimen collection method  Answer:  Lab=Lab collect   12/18/17 1352   12/18/17 0500  Hepatitis panel, acute  Tomorrow morning,   R     12/18/17 0111       Medication recommendations:   Other recommendations:     Thank you for allowing [place] to participate in the care of your patient!  Please consult [place] again if you have further needs for your patient.  Arta Bruce Kinsinger 12/21/2017 9:15 AM    Subjective  Pain much improved, tolerating diet   Objective  Vital signs in last 24 hours: Temp:  [97.4 F (36.3 C)-98.1 F (36.7 C)] 97.9 F (36.6 C) (05/12 0559) Pulse Rate:  [73-82] 82 (05/12 0559) Resp:  [19-25] 19 (05/12 0559) BP: (135-139)/(68-77) 135/68 (05/12 0559) SpO2:  [97 %-99 %] 97 % (05/12 0559) Weight:  [74.5 kg (164 lb 3.2 oz)] 74.5 kg (164 lb 3.2 oz) (05/12  0600)  General: NAD  Ab: soft, NT, ND, drain with brown bilious drainage   Pertinent labs and Studies: Recent Labs    12/19/17 0308 12/20/17 0542 12/21/17 0530  WBC 17.9* 11.9* 9.2  HGB 11.4* 11.8* 11.6*  HCT 31.7* 33.5* 33.4*   BMET Recent Labs    12/20/17 0542 12/21/17 0530  NA 139 142  K 4.0 4.1  CL 110 113*  CO2 20* 21*  GLUCOSE 87 83  BUN 31* 23*  CREATININE 1.31* 1.18  CALCIUM 8.0* 8.4*   No results for input(s): LABURIN in the last 72 hours. Results for orders placed or performed during the hospital encounter of 12/17/17  Culture, blood (x 2)     Status: None (Preliminary result)   Collection Time: 12/17/17 11:06 PM  Result Value Ref Range Status   Specimen Description BLOOD RIGHT ANTECUBITAL  Final   Special Requests   Final    BOTTLES DRAWN AEROBIC AND ANAEROBIC Blood Culture adequate volume   Culture   Final    NO GROWTH 2 DAYS Performed at [facility], [place] [address], [place], [place] [address]    Report Status PENDING  Incomplete  Culture, blood (x 2)     Status:  None (Preliminary result)   Collection Time: 12/18/17  6:34 AM  Result Value Ref Range Status   Specimen Description BLOOD LEFT HAND  Final   Special Requests   Final    BOTTLES DRAWN AEROBIC AND ANAEROBIC Blood Culture adequate volume   Culture   Final    NO GROWTH 2 DAYS Performed at [facility], [address] [place], [place] [address]    Report Status PENDING  Incomplete  MRSA PCR Screening     Status: None   Collection Time: 12/19/17  9:20 AM  Result Value Ref Range Status   MRSA by PCR NEGATIVE NEGATIVE Final    Comment:        The GeneXpert MRSA Assay (FDA approved for NASAL specimens only), is one component of a comprehensive MRSA colonization surveillance program. It is not intended to diagnose MRSA infection nor to guide or monitor treatment for MRSA infections. Performed at [facility], [place] [address] [place], [place] [address]    Aerobic/Anaerobic Culture (surgical/deep wound)     Status: None (Preliminary result)   Collection Time: 12/19/17  2:58 PM  Result Value Ref Range Status   Specimen Description BILE  Final   Special Requests NONE  Final   Gram Stain   Final    ABUNDANT WBC PRESENT, PREDOMINANTLY PMN FEW GRAM POSITIVE COCCI FEW GRAM VARIABLE ROD    Culture   Final    CULTURE REINCUBATED FOR BETTER GROWTH Performed at [facility], [place] [address] [place], [place] [address]    Report Status PENDING  Incomplete    Imaging: Dg Chest Port 1 View  Result Date: 12/20/2017 CLINICAL DATA:  Cough.  History of hypertension. EXAM: PORTABLE CHEST 1 VIEW COMPARISON:  12/17/2017; 01/26/2014; 08/14/2011; ultrasound fluoroscopic guided cholecystostomy tube placement-12/19/2017 FINDINGS: Grossly unchanged cardiac silhouette and mediastinal contours with atherosclerotic plaque within thoracic aorta. The pulmonary vasculature remains indistinct with cephalization of flow. Grossly unchanged bibasilar opacities favored to represent atelectasis. No new focal airspace opacities. No pleural effusion or pneumothorax. No acute osseus abnormalities. Cholecystostomy tube overlies the right upper abdominal quadrant. IMPRESSION: Suspected mild pulmonary edema, though note, atypical infection could have a similar appearance. Clinical correlation is advised. Electronically Signed   By: Sandi Mariscal M.D.   On: 12/20/2017 14:38

## 2017-12-21 NOTE — Progress Notes (Signed)
  Speech Language Pathology Treatment: Dysphagia  Patient Details Name: Lance Martin MRN: [id number] DOB: [birth year] Today's Date: 12/21/2017 Time: 5681-2751 SLP Time Calculation (min) (ACUTE ONLY): 20 min  Assessment / Plan / Recommendation Clinical Impression  Patient presents with consistent coughing, throat clearing, multiple swallows with thin liquids. Pt also noted to tilt his head posteriorly, in an apparent effort to aid transit. Suspect delayed swallow. Daughter present and reports pt coughing when he drinks; this is a fairly recent development (during the last few days per her report). With nectar thick liquids, multiple swallows persist, but there is no coughing or throat clearing. Pt now on full liquids per MD; discussed with daughter and pt who agree with downgrade to nectar thick liquids pending MBS for objective evaluation of swallow function. Will follow up next date for MBS; continue full liquids, but recommend nectar thick.       HPI HPI: Pt. with PMH of HTN, HLD, gout, prostate cancer, hemorrhagic cystitis S/P fulguration, GERD; admitted on 12/17/2017, presented with complaint of abdominal pain, was found to have acute cholecystitis, s/p percutaneous cholecystostomy 12/19/17; on clears per surgery to be advanced as tolerated. Per GI pt with recent trouble swallowing with increased regurgitation prior to admission. GI recommending dysphagia w/u with esophagram, likely next week.       SLP Plan  Continue with current plan of care       Recommendations  Diet recommendations: Nectar-thick liquid(solids per MD) Liquids provided via: Straw;Cup Medication Administration: Whole meds with puree Supervision: Patient able to self feed Compensations: Slow rate;Small sips/bites                Oral Care Recommendations: Oral care BID Follow up Recommendations: Skilled Nursing facility SLP Visit Diagnosis: Dysphagia, unspecified (R13.10) Plan: Continue with current plan of  care       [place], [place], [place] Speech-Language Pathologist [place] 12/21/2017, 1:00 PM

## 2017-12-21 NOTE — Progress Notes (Signed)
Referring Physician(s): Dr Marlowe Sax; Dr Kieth Brightly-- [place]  Supervising Physician: Corrie Mckusick  Patient Status:  [facility] - In-pt  Chief Complaint:  Acute cholecystitis Perc Cholecystostomy drain 5/10  Subjective:  Draining well OP bile Denies N/V Eating reg diet Chole drain to stay in place 6-8 weeks Follow up in [facility]- orders in place  Allergies: Allopurinol and Ampicillin  Medications: Prior to Admission medications   Medication Sig Start Date End Date Taking? Authorizing Provider  amLODipine (NORVASC) 2.5 MG tablet Take 2.5 mg by mouth every evening.  11/25/16  Yes [provider]  atorvastatin (LIPITOR) 40 MG tablet Take 40 mg by mouth daily.   Yes [provider]  benazepril (LOTENSIN) 40 MG tablet Take 40 mg by mouth at bedtime.    Yes [provider]  calcium-vitamin D (OSCAL WITH D) 500-200 MG-UNIT tablet Take 1 tablet by mouth.   Yes [provider]  cetirizine (ZYRTEC) 10 MG tablet Take 10 mg by mouth daily.   Yes [provider]  cholecalciferol (VITAMIN D) 1000 units tablet Take 1,000 Units by mouth daily.   Yes [provider]  Coenzyme Q10 (COQ-10) 100 MG CAPS Take 1 Can by mouth as needed.    Yes [provider]  Flaxseed, Linseed, (FLAXSEED OIL PO) Take 1,400 mg by mouth daily. Will stop prior to procedure   Yes [provider]  fluticasone (FLONASE) 50 MCG/ACT nasal spray Place 2 sprays into the nose daily. ALLERGIES   Yes [provider]  metoprolol succinate (TOPROL-XL) 25 MG 24 hr tablet Take 25 mg by mouth daily.   Yes [provider]  Multiple Vitamins-Minerals (MULTIVITAMINS THER. W/MINERALS) TABS Take 1 tablet by mouth daily.    Yes [provider]  TURMERIC PO Take 1,000 mg by mouth 2 (two) times daily.   Yes [provider]  Febuxostat 80 MG TABS Take 80 mg by mouth daily.     [provider]  omeprazole (PRILOSEC) 40 MG capsule  Take 1 capsule (40 mg total) by mouth daily. 09/04/17   Zehr, Laban Emperor, PA-C     Vital Signs: BP 135/68 (BP Location: Left Arm)   Pulse 82   Temp 97.9 F (36.6 C) (Oral)   Resp 19   Ht 5\' 8"  (1.727 m)   Wt 164 lb 3.2 oz (74.5 kg)   SpO2 97%   BMI 24.97 kg/m   Physical Exam  Constitutional: He is oriented to person, place, and time.  Pulmonary/Chest: Effort normal and breath sounds normal.  Abdominal: Soft. Bowel sounds are normal.  Musculoskeletal: Normal range of motion.  Neurological: He is alert and oriented to person, place, and time.  Skin: Skin is warm and dry.  Site is clean and dry NT no bleeding OP bile 100 cc yesterday 50 cc in bag  Psychiatric: He has a normal mood and affect. His behavior is normal.  Vitals reviewed.   Imaging: Dg Chest 2 View  Result Date: 12/17/2017 CLINICAL DATA:  Sepsis EXAM: CHEST - 2 VIEW COMPARISON:  01/16/2014 FINDINGS: Mild interstitial edema. Heart size and mediastinal contours are stable with minimal aortic atherosclerosis. No pulmonary consolidation. No acute osseous abnormality. IMPRESSION: Mild interstitial edema. No acute pulmonary consolidation. Aortic atherosclerosis. Electronically Signed   By: Ashley Royalty M.D.   On: 12/17/2017 23:05   Mr Abdomen Mrcp Wo Contrast  Result Date: 12/18/2017 CLINICAL DATA:  Right upper quadrant pain and jaundice. Abnormal liver and gallbladder on ultrasound. EXAM:  MRI ABDOMEN WITHOUT CONTRAST  (INCLUDING MRCP) TECHNIQUE: Multiplanar multisequence MR imaging of the abdomen was performed. Heavily T2-weighted images of the biliary and pancreatic ducts were obtained, and three-dimensional MRCP images were rendered by post processing. COMPARISON:  Ultrasound on 12/17/2017 FINDINGS: Lower chest: No acute findings. Hepatobiliary: Image degradation by motion artifact noted. Hepatic cirrhosis is demonstrated. No hepatic mass visualized on this unenhanced exam. The gallbladder is dilated and shows moderate diffuse  wall thickening and pericholecystic edema. Sludge is also seen within the gallbladder lumen. These findings are greater than typically seen with cirrhosis, and acute cholecystitis cannot be excluded. There is no evidence of biliary ductal dilatation, with common bile duct measuring approximately 5 mm. Pancreas: No mass or inflammatory process visualized on this unenhanced exam. No evidence of pancreatic ductal dilatation. Spleen:  Within normal limits in size. Adrenals/Urinary tract: Tiny right renal cyst noted. No evidence of hydronephrosis. Stomach/Bowel: Visualized portion unremarkable. Vascular/Lymphatic: No pathologically enlarged lymph nodes identified. No evidence of abdominal aortic aneurysm. Other:  Mild ascites. Musculoskeletal:  No suspicious bone lesions identified. IMPRESSION: Hepatic cirrhosis and mild ascites. Dilated gallbladder with sludge, moderate diffuse wall thickening and pericholecystic edema. These findings are greater than typically seen with cirrhosis, and acute cholecystitis cannot be excluded. If clinically warranted, nuclear medicine hepatobiliary scan could be performed for further evaluation. No evidence of biliary ductal dilatation. Electronically Signed   By: Earle Gell M.D.   On: 12/18/2017 07:32   Mr 3d Recon At Scanner  Result Date: 12/18/2017 CLINICAL DATA:  Right upper quadrant pain and jaundice. Abnormal liver and gallbladder on ultrasound. EXAM: MRI ABDOMEN WITHOUT CONTRAST  (INCLUDING MRCP) TECHNIQUE: Multiplanar multisequence MR imaging of the abdomen was performed. Heavily T2-weighted images of the biliary and pancreatic ducts were obtained, and three-dimensional MRCP images were rendered by post processing. COMPARISON:  Ultrasound on 12/17/2017 FINDINGS: Lower chest: No acute findings. Hepatobiliary: Image degradation by motion artifact noted. Hepatic cirrhosis is demonstrated. No hepatic mass visualized on this unenhanced exam. The gallbladder is dilated and shows  moderate diffuse wall thickening and pericholecystic edema. Sludge is also seen within the gallbladder lumen. These findings are greater than typically seen with cirrhosis, and acute cholecystitis cannot be excluded. There is no evidence of biliary ductal dilatation, with common bile duct measuring approximately 5 mm. Pancreas: No mass or inflammatory process visualized on this unenhanced exam. No evidence of pancreatic ductal dilatation. Spleen:  Within normal limits in size. Adrenals/Urinary tract: Tiny right renal cyst noted. No evidence of hydronephrosis. Stomach/Bowel: Visualized portion unremarkable. Vascular/Lymphatic: No pathologically enlarged lymph nodes identified. No evidence of abdominal aortic aneurysm. Other:  Mild ascites. Musculoskeletal:  No suspicious bone lesions identified. IMPRESSION: Hepatic cirrhosis and mild ascites. Dilated gallbladder with sludge, moderate diffuse wall thickening and pericholecystic edema. These findings are greater than typically seen with cirrhosis, and acute cholecystitis cannot be excluded. If clinically warranted, nuclear medicine hepatobiliary scan could be performed for further evaluation. No evidence of biliary ductal dilatation. Electronically Signed   By: Earle Gell M.D.   On: 12/18/2017 07:32   Ir Perc Cholecystostomy  Result Date: 12/19/2017 INDICATION: 84 year old male with acute cholecystitis (sludge, no definite stones). He is currently a poor operative candidate in the setting of hepatic cirrhosis. He presents for percutaneous cholecystostomy tube placement. EXAM: CHOLECYSTOSTOMY MEDICATIONS: In patient currently receiving intravenous cefepime and Flagyl. No additional antibiotic prophylaxis administered. ANESTHESIA/SEDATION: Moderate (conscious) sedation was employed during this procedure. A total of Versed 1 mg and Fentanyl 50 mcg was administered intravenously.  Moderate Sedation Time: 13 minutes. The patient's level of consciousness and vital signs  were monitored continuously by radiology nursing throughout the procedure under my direct supervision. FLUOROSCOPY TIME:  Fluoroscopy Time: 1 minutes 0 seconds (21 mGy). COMPLICATIONS: None immediate. PROCEDURE: Informed written consent was obtained from the patient after a thorough discussion of the procedural risks, benefits and alternatives. All questions were addressed. Maximal Sterile Barrier Technique was utilized including caps, mask, sterile gowns, sterile gloves, sterile drape, hand hygiene and skin antiseptic. A timeout was performed prior to the initiation of the procedure. The right upper quadrant was interrogated with ultrasound. The right hemidiaphragm is elevated. The liver sits high in the right upper quadrant. The distended and inflamed gallbladder containing sludge was successfully identified. There is a small volume of perihepatic ascites. A suitable skin entry site was selected and marked. Local anesthesia was attained by infiltration with 1% lidocaine. A small dermatotomy was made. Under real-time sonographic guidance, the gallbladder lumen was punctured with a 21 gauge Accustick needle along a short transhepatic course. The 0.018 wire was then coiled in the gallbladder lumen. The needle was exchanged over the wire for the Accustick sheath which was advanced into the gallbladder. A gentle hand injection of contrast material confirms the Accustick sheath is within the gallbladder lumen. There is extensive debris within the lumen and cobblestoning of the mucosa. A Bentson wire was then coiled in the gallbladder lumen. The Accustick sheath was removed. The percutaneous tract was dilated to 10 [place] and a Cook 10.2 [place] all-purpose drainage catheter was advanced over the wire and formed in the gallbladder lumen. Aspiration was performed yielding approximately 60 mL of thick, purulent bile. A sample was sent for Gram stain and culture. The cholecystostomy tube was gently flushed and then connected  to gravity bag drainage. The catheter was secured to the skin with 0 Prolene suture. The patient tolerated the procedure well. IMPRESSION: Successful placement of a percutaneous transhepatic cholecystostomy tube for the treatment of acute cholecystitis (sludge, no definite stones on imaging). PLAN: 1. Maintain tube to gravity bag drainage. 2. Given patient's underlying cirrhosis, he is unlikely to be common optimal surgical candidate in the future. 3. IR drain clinic in 4 weeks for cholangiogram through existing tube. If the cystic duct regains patency, the percutaneous drainage catheter could be removed after 6 weeks given that there is no definitive evidence of cholelithiasis. Signed, Criselda Peaches, MD [facility] Electronically Signed   By: Jacqulynn Cadet M.D.   On: 12/19/2017 16:13   Dg Chest Port 1 View  Result Date: 12/20/2017 CLINICAL DATA:  Cough.  History of hypertension. EXAM: PORTABLE CHEST 1 VIEW COMPARISON:  12/17/2017; 01/26/2014; 08/14/2011; ultrasound fluoroscopic guided cholecystostomy tube placement-12/19/2017 FINDINGS: Grossly unchanged cardiac silhouette and mediastinal contours with atherosclerotic plaque within thoracic aorta. The pulmonary vasculature remains indistinct with cephalization of flow. Grossly unchanged bibasilar opacities favored to represent atelectasis. No new focal airspace opacities. No pleural effusion or pneumothorax. No acute osseus abnormalities. Cholecystostomy tube overlies the right upper abdominal quadrant. IMPRESSION: Suspected mild pulmonary edema, though note, atypical infection could have a similar appearance. Clinical correlation is advised. Electronically Signed   By: Sandi Mariscal M.D.   On: 12/20/2017 14:38   US Abdomen Limited Ruq  Result Date: 12/17/2017 CLINICAL DATA:  Right upper quadrant abdominal pain and jaundice. EXAM: ULTRASOUND ABDOMEN LIMITED RIGHT UPPER QUADRANT COMPARISON:   None. FINDINGS: Gallbladder: Tumefactive biliary sludge is identified within the gallbladder. The single wall  thickness of the gallbladder is 4.2 mm, normal less than 3 mm. No pericholecystic fluid. No sonographic Murphy sign noted by sonographer. Common bile duct: Diameter: 5 mm Liver: No focal lesion identified given limitations due to the patient's inability to adequately position for better images. Within normal limits in parenchymal echogenicity. Portal vein is patent on color Doppler imaging though indeterminate in direction of blood flow given mixed color signal noted within. Nodular liver surface compatible with cirrhosis. Adjacent small volume of ascites. IMPRESSION: 1. Thick-walled gallbladder with biliary sludge noted within. 2. Cirrhotic appearing liver with adjacent small volume of perihepatic ascites. Mixed color Doppler signal within the imaged portal venous system may represent early changes of portal hypertension though the portal vein is patent. Electronically Signed   By: Ashley Royalty M.D.   On: 12/17/2017 19:55    Labs:  CBC: Recent Labs    12/18/17 0935 12/19/17 0308 12/20/17 0542 12/21/17 0530  WBC 21.3* 17.9* 11.9* 9.2  HGB 12.2* 11.4* 11.8* 11.6*  HCT 34.8* 31.7* 33.5* 33.4*  PLT 146* 161 177 219    COAGS: Recent Labs    12/17/17 2123 12/18/17 0651 12/19/17 0647  INR 1.45 1.72 1.76  APTT 38* 189*  --     BMP: Recent Labs    12/18/17 0651 12/19/17 0308 12/20/17 0542 12/21/17 0530  NA 135 136 139 142  K 3.2* 3.0* 4.0 4.1  CL 100* 105 110 113*  CO2 22 22 20* 21*  GLUCOSE 77 78 87 83  BUN 43* 37* 31* 23*  CALCIUM 8.2* 7.8* 8.0* 8.4*  CREATININE 1.78* 1.58* 1.31* 1.18  GFRNONAA 34* 39* 49* 55*  GFRAA 39* 45* 56* >60    LIVER FUNCTION TESTS: Recent Labs    12/17/17 1823 12/18/17 0651 12/19/17 0308  BILITOT 5.4* 5.4* 4.5*  AST 105* 86* 80*  ALT 64* 56 49  ALKPHOS 196* 167* 183*  PROT 5.7* 5.2* 4.9*  ALBUMIN 2.6* 2.4* 2.1*    Assessment and  Plan:  Percutaneous cholecystostomy drain placed 5/10 In place for 6-8 weeks Will follow in IR OP Clinic He will hear from scheduler for time and date flush at home daily-- 5 cc sterile saline  Electronically Signed: Deryk Bozman A, PA-C 12/21/2017, 9:18 AM   I spent a total of 15 Minutes at the the patient's bedside AND on the patient's hospital floor or unit, greater than 50% of which was counseling/coordinating care for perc chole drain

## 2017-12-21 NOTE — Progress Notes (Signed)
[facility] Progress Note  Patient: Lance Martin ZOX:[id number]   PCP: Crist Infante, MD DOB: 1933-01-19   DOA: 12/17/2017   DOS: 12/21/2017   Date of Service: the patient was seen and examined on 12/21/2017  Subjective: No acute complaint.  No nausea no vomiting no fever no chills.  Still complains about pleuritic chest pain on the right when he is taking a deep breath after insertion of the cholecystostomy tube.  Passing gas.  No diarrhea.  Brief hospital course: Pt. with PMH of HTN, HLD, gout, prostate cancer, hemorrhagic cystitis S/P fulguration, GERD; admitted on 12/17/2017, presented with complaint of abdominal pain, was found to have acute cholecystitis. Currently further plan is conservative management.  Assessment and Plan: 1.  Acute cholecystitis. Presented with abdominal pain, GI was consulted, MRCP was performed, CBD did not show any stone, there were also no gallstones.  Gallbladder was thickened. GI currently has signed off recommending no further work-up from their perspective. General surgery was consulted by GI for cholecystitis,  Surgery feels that the patient would be at high risk and therefore recommended percutaneous cholecystostomy tube placement. She has some pleuritic chest pain after the insertion although repeat her chest x-ray shows no evidence of acute abnormality. Culture from the drain ER growing multiple species, no anaerobes.  Patient on IV cefepime and Flagyl, will transition him to oral Keflex and Flagyl.   2.  Elevated troponin. Cardiology was consulted. Preoperative evaluation suggest patient will be at moderate risk for surgery. Elevated troponin likely demand ischemia and A. fib. No further work-up recommended.  3.  Sinus tachycardia. Likely in the response of infection. Initially thought to be A. fib on admission although it appears that PAC burden rather than active A. fib.  Started on beta-blocker.  Monitor. Patient was started on  anticoagulation. Discontinue heparin.  4. Essential hypertension. Blood pressure soft. Patient on 3 blood pressure medications at home, currently on hold.  Rate control with beta-blocker initiated.  5.  New diagnosis of cirrhosis. As per GI likely combination of alcohol induced and Nash. Outpatient follow-up recommended by GI. CPC class C, MELD 24. Recommend alcohol cessation.  6.  Cough. Chest x-ray performed to verify the position of the cholecystostomy tube. Overlies right upper abdominal quadrant. Will initiate on incentive spirometry.  7.  Dysphagia. Speech therapy consulted, recommend MBS.  GI has recommended esophagogram in the past.  Outpatient follow-up with GI.  8.  Constipation. On lactulose.  Diet: Full liquid diet, nectar thick DVT Prophylaxis: subcutaneous Heparin  Advance goals of care discussion: dnr dni  Family Communication: no family was present at bedside, at the time of interview.  Disposition:  Discharge to SNF, social worker consulted  Consultants: gastroenterology General surgery IR Procedures: none  Antibiotics: Anti-infectives (From admission, onward)   Start     Dose/Rate Route Frequency Ordered Stop   12/21/17 1515  cephALEXin (KEFLEX) capsule 500 mg     500 mg Oral Every 12 hours 12/21/17 1513     12/21/17 1515  metroNIDAZOLE (FLAGYL) tablet 500 mg     500 mg Oral Every 8 hours 12/21/17 1513     12/21/17 0933  ceFEPIme (MAXIPIME) 2 g in sodium chloride 0.9 % 100 mL IVPB  Status:  Discontinued     2 g 200 mL/hr over 30 Minutes Intravenous Every 24 hours 12/20/17 1440 12/21/17 1512   12/18/17 1000  ceFEPIme (MAXIPIME) 2 g in sodium chloride 0.9 % 100 mL IVPB  Status:  Discontinued  2 g 200 mL/hr over 30 Minutes Intravenous Every 12 hours 12/17/17 2220 12/20/17 1440   12/18/17 0600  metroNIDAZOLE (FLAGYL) IVPB 500 mg  Status:  Discontinued     500 mg 100 mL/hr over 60 Minutes Intravenous Every 8 hours 12/17/17 2220 12/21/17 1512    12/17/17 2215  ceFEPIme (MAXIPIME) 2 g in sodium chloride 0.9 % 100 mL IVPB     2 g 200 mL/hr over 30 Minutes Intravenous  Once 12/17/17 2203 12/18/17 0206   12/17/17 2200  ciprofloxacin (CIPRO) IVPB 400 mg  Status:  Discontinued     400 mg 200 mL/hr over 60 Minutes Intravenous  Once 12/17/17 2149 12/17/17 2203   12/17/17 2200  metroNIDAZOLE (FLAGYL) IVPB 500 mg     500 mg 100 mL/hr over 60 Minutes Intravenous  Once 12/17/17 2149 12/18/17 0206       Objective: Physical Exam: Vitals:   12/20/17 1948 12/21/17 0559 12/21/17 0600 12/21/17 1422  BP: 139/73 135/68  133/64  Pulse: 73 82  68  Resp: (!) 25 19  17   Temp: 98.1 F (36.7 C) 97.9 F (36.6 C)  98.8 F (37.1 C)  TempSrc: Oral Oral  Oral  SpO2: 99% 97%  98%  Weight:   74.5 kg (164 lb 3.2 oz)   Height:        Intake/Output Summary (Last 24 hours) at 12/21/2017 1529 Last data filed at 12/21/2017 1500 Gross per 24 hour  Intake 855 ml  Output 850 ml  Net 5 ml   Filed Weights   12/18/17 0321 12/20/17 0523 12/21/17 0600  Weight: 72.6 kg (160 lb) 69.8 kg (153 lb 14.4 oz) 74.5 kg (164 lb 3.2 oz)   General: Alert, Awake and Oriented to Time, Place and Person. Appear in mild distress, affect appropriate Eyes: PERRL, Conjunctiva normal ENT: Oral Mucosa clear moist. Neck: no JVD, no Abnormal Mass Or lumps Cardiovascular: S1 and S2 Present, aortic systolic Murmur, Peripheral Pulses Present Respiratory: normal respiratory effort, Bilateral Air entry equal and Decreased, no use of accessory muscle, Clear to Auscultation, no Crackles, no wheezes Abdomen: Bowel Sound present, Soft and mild tenderness, no hernia Skin: no redness, no Rash, no induration Extremities: no Pedal edema, no calf tenderness Neurologic: Grossly no focal neuro deficit. Bilaterally Equal motor strength  Data Reviewed: CBC: Recent Labs  Lab 12/17/17 1521 12/18/17 0651 12/18/17 0935 12/19/17 0308 12/20/17 0542 12/21/17 0530  WBC 18.2* 21.5* 21.3* 17.9*  11.9* 9.2  NEUTROABS 14.0  --   --  14.3*  --   --   HGB 13.6 12.1* 12.2* 11.4* 11.8* 11.6*  HCT 37.7* 33.8* 34.8* 31.7* 33.5* 33.4*  MCV 97.9 97.7 97.2 96.9 98.5 97.9  PLT 176 160 146* 161 177 973   Basic Metabolic Panel: Recent Labs  Lab 12/17/17 1823 12/18/17 0651 12/19/17 0308 12/20/17 0542 12/21/17 0530  NA 134* 135 136 139 142  K 3.6 3.2* 3.0* 4.0 4.1  CL 98* 100* 105 110 113*  CO2 22 22 22  20* 21*  GLUCOSE 97 77 78 87 83  BUN 47* 43* 37* 31* 23*  CREATININE 1.98* 1.78* 1.58* 1.31* 1.18  CALCIUM 8.4* 8.2* 7.8* 8.0* 8.4*  MG  --  1.8 1.8 1.9  --   PHOS  --  3.8  --   --   --     Liver Function Tests: Recent Labs  Lab 12/17/17 1823 12/18/17 0651 12/19/17 0308  AST 105* 86* 80*  ALT 64* 56 49  ALKPHOS 196* 167*  183*  BILITOT 5.4* 5.4* 4.5*  PROT 5.7* 5.2* 4.9*  ALBUMIN 2.6* 2.4* 2.1*   Recent Labs  Lab 12/17/17 1823  LIPASE 121*   Recent Labs  Lab 12/19/17 0308  AMMONIA 43*   Coagulation Profile: Recent Labs  Lab 12/17/17 2123 12/18/17 0651 12/19/17 0647  INR 1.45 1.72 1.76   Cardiac Enzymes: Recent Labs  Lab 12/17/17 2123 12/18/17 0651 12/18/17 0935  TROPONINI 0.10* 0.03* <0.03   BNP (last 3 results) No results for input(s): PROBNP in the last 8760 hours. CBG: No results for input(s): GLUCAP in the last 168 hours. Studies: Dg Chest 2 View  Result Date: 12/21/2017 CLINICAL DATA:  Pleuritic chest pain for 3 days, history hypertension, prostate cancer, gout EXAM: CHEST - 2 VIEW COMPARISON:  12/20/2017 FINDINGS: Normal heart size, mediastinal contours, and pulmonary vascularity. Atherosclerotic calcifications at aortic arch. Elevation of RIGHT diaphragm. No definite acute infiltrate, pleural effusion or pneumothorax. Osseous structures unremarkable. Pigtail drainage catheter RIGHT upper quadrant. IMPRESSION: Elevation of RIGHT diaphragm, chronic. No acute abnormalities. Electronically Signed   By: Lavonia Dana M.D.   On: 12/21/2017 13:18      Scheduled Meds: . cephALEXin  500 mg Oral Q12H  . lactulose  10 g Oral Daily  . metoprolol tartrate  25 mg Oral BID  . metroNIDAZOLE  500 mg Oral Q8H  . potassium chloride  40 mEq Oral Daily  . sodium chloride flush  5 mL Intracatheter Q8H   Continuous Infusions:  PRN Meds: acetaminophen, morphine injection, ondansetron **OR** ondansetron (ZOFRAN) IV, polyvinyl alcohol, RESOURCE THICKENUP CLEAR, traMADol  Time spent: 35 minutes  Author: Berle Mull, MD [facility] Pager: [phone] 12/21/2017 3:29 PM  If 7PM-7AM, please contact night-coverage at [email or web address], password [facility]

## 2017-12-21 NOTE — Progress Notes (Signed)
Primary Cardiologist Dr. Radford Pax   Subjective:  Denies SSCP, palpitations or Dyspnea Taking PO   Objective:  Vitals:   12/20/17 1453 12/20/17 1948 12/21/17 0559 12/21/17 0600  BP: 139/77 139/73 135/68   Pulse: 76 73 82   Resp:  (!) 25 19   Temp: (!) 97.4 F (36.3 C) 98.1 F (36.7 C) 97.9 F (36.6 C)   TempSrc: Oral Oral Oral   SpO2: 99% 99% 97%   Weight:    164 lb 3.2 oz (74.5 kg)  Height:        Intake/Output from previous day:  Intake/Output Summary (Last 24 hours) at 12/21/2017 0906 Last data filed at 12/21/2017 0603 Gross per 24 hour  Intake 1450 ml  Output 850 ml  Net 600 ml    Physical Exam: Affect appropriate Healthy:  appears stated age HEENT: normal Neck supple with no adenopathy JVP normal no bruits no thyromegaly Lungs clear with no wheezing and good diaphragmatic motion Heart:  S1/S2 no murmur, no rub, gallop or click PMI normal Abdomen:cholycystostomy tube in RUQ draining well  Distal pulses intact with no bruits No edema Neuro non-focal Skin warm and dry No muscular weakness   Lab Results: Basic Metabolic Panel: Recent Labs    12/19/17 0308 12/20/17 0542 12/21/17 0530  NA 136 139 142  K 3.0* 4.0 4.1  CL 105 110 113*  CO2 22 20* 21*  GLUCOSE 78 87 83  BUN 37* 31* 23*  CREATININE 1.58* 1.31* 1.18  CALCIUM 7.8* 8.0* 8.4*  MG 1.8 1.9  --    Liver Function Tests: Recent Labs    12/19/17 0308  AST 80*  ALT 49  ALKPHOS 183*  BILITOT 4.5*  PROT 4.9*  ALBUMIN 2.1*   No results for input(s): LIPASE, AMYLASE in the last 72 hours. CBC: Recent Labs    12/19/17 0308 12/20/17 0542 12/21/17 0530  WBC 17.9* 11.9* 9.2  NEUTROABS 14.3*  --   --   HGB 11.4* 11.8* 11.6*  HCT 31.7* 33.5* 33.4*  MCV 96.9 98.5 97.9  PLT 161 177 219   Cardiac Enzymes: Recent Labs    12/18/17 0935  TROPONINI <0.03    No results for input(s): TSH, T4TOTAL, T3FREE, THYROIDAB in the last 72 hours.  Invalid input(s): FREET3 Anemia Panel: No  results for input(s): VITAMINB12, FOLATE, FERRITIN, TIBC, IRON, RETICCTPCT in the last 72 hours.  Imaging: Ir Perc Cholecystostomy  Result Date: 12/19/2017 INDICATION: 84 year old male with acute cholecystitis (sludge, no definite stones). He is currently a poor operative candidate in the setting of hepatic cirrhosis. He presents for percutaneous cholecystostomy tube placement. EXAM: CHOLECYSTOSTOMY MEDICATIONS: In patient currently receiving intravenous cefepime and Flagyl. No additional antibiotic prophylaxis administered. ANESTHESIA/SEDATION: Moderate (conscious) sedation was employed during this procedure. A total of Versed 1 mg and Fentanyl 50 mcg was administered intravenously. Moderate Sedation Time: 13 minutes. The patient's level of consciousness and vital signs were monitored continuously by radiology nursing throughout the procedure under my direct supervision. FLUOROSCOPY TIME:  Fluoroscopy Time: 1 minutes 0 seconds (21 mGy). COMPLICATIONS: None immediate. PROCEDURE: Informed written consent was obtained from the patient after a thorough discussion of the procedural risks, benefits and alternatives. All questions were addressed. Maximal Sterile Barrier Technique was utilized including caps, mask, sterile gowns, sterile gloves, sterile drape, hand hygiene and skin antiseptic. A timeout was performed prior to the initiation of the procedure. The right upper quadrant was interrogated with ultrasound. The right hemidiaphragm is elevated. The liver sits high in the  right upper quadrant. The distended and inflamed gallbladder containing sludge was successfully identified. There is a small volume of perihepatic ascites. A suitable skin entry site was selected and marked. Local anesthesia was attained by infiltration with 1% lidocaine. A small dermatotomy was made. Under real-time sonographic guidance, the gallbladder lumen was punctured with a 21 gauge Accustick needle along a short transhepatic course.  The 0.018 wire was then coiled in the gallbladder lumen. The needle was exchanged over the wire for the Accustick sheath which was advanced into the gallbladder. A gentle hand injection of contrast material confirms the Accustick sheath is within the gallbladder lumen. There is extensive debris within the lumen and cobblestoning of the mucosa. A Bentson wire was then coiled in the gallbladder lumen. The Accustick sheath was removed. The percutaneous tract was dilated to 10 [place] and a Cook 10.2 [place] all-purpose drainage catheter was advanced over the wire and formed in the gallbladder lumen. Aspiration was performed yielding approximately 60 mL of thick, purulent bile. A sample was sent for Gram stain and culture. The cholecystostomy tube was gently flushed and then connected to gravity bag drainage. The catheter was secured to the skin with 0 Prolene suture. The patient tolerated the procedure well. IMPRESSION: Successful placement of a percutaneous transhepatic cholecystostomy tube for the treatment of acute cholecystitis (sludge, no definite stones on imaging). PLAN: 1. Maintain tube to gravity bag drainage. 2. Given patient's underlying cirrhosis, he is unlikely to be common optimal surgical candidate in the future. 3. IR drain clinic in 4 weeks for cholangiogram through existing tube. If the cystic duct regains patency, the percutaneous drainage catheter could be removed after 6 weeks given that there is no definitive evidence of cholelithiasis. Signed, Criselda Peaches, MD [facility] Electronically Signed   By: Jacqulynn Cadet M.D.   On: 12/19/2017 16:13   Dg Chest Port 1 View  Result Date: 12/20/2017 CLINICAL DATA:  Cough.  History of hypertension. EXAM: PORTABLE CHEST 1 VIEW COMPARISON:  12/17/2017; 01/26/2014; 08/14/2011; ultrasound fluoroscopic guided cholecystostomy tube placement-12/19/2017 FINDINGS: Grossly unchanged cardiac  silhouette and mediastinal contours with atherosclerotic plaque within thoracic aorta. The pulmonary vasculature remains indistinct with cephalization of flow. Grossly unchanged bibasilar opacities favored to represent atelectasis. No new focal airspace opacities. No pleural effusion or pneumothorax. No acute osseus abnormalities. Cholecystostomy tube overlies the right upper abdominal quadrant. IMPRESSION: Suspected mild pulmonary edema, though note, atypical infection could have a similar appearance. Clinical correlation is advised. Electronically Signed   By: Sandi Mariscal M.D.   On: 12/20/2017 14:38    Cardiac Studies:  ECG:  Orders placed or performed during the hospital encounter of 12/17/17  . EKG 12-Lead  . EKG 12-Lead  . EKG 12-Lead  . EKG 12-Lead  . EKG 12-Lead     Telemetry:  Echo: 12/18/17 EF 65-70%  Medications:   . lactulose  10 g Oral Daily  . metoprolol tartrate  2.5 mg Intravenous Q6H  . potassium chloride  40 mEq Oral Daily  . sodium chloride flush  5 mL Intracatheter Q8H     . ceFEPime (MAXIPIME) IV    . metronidazole Stopped (12/21/17 0700)    Assessment/Plan:  SVT/Atrial Arrhythmia:  Taking PO Lopressor 25 bid  GI:  Post tube drainage of GB with no cardiac complications will need f/u with IR Continue antibiotics per IM  Jenkins Rouge 12/21/2017, 9:06 AM

## 2017-12-22 ENCOUNTER — Encounter: Payer: Self-pay | Admitting: Internal Medicine

## 2017-12-22 ENCOUNTER — Inpatient Hospital Stay (HOSPITAL_COMMUNITY): Payer: Medicare Other

## 2017-12-22 LAB — CBC
HEMATOCRIT: 33.2 % — AB (ref 39.0–52.0)
Hemoglobin: 11.4 g/dL — ABNORMAL LOW (ref 13.0–17.0)
MCH: 33.9 pg (ref 26.0–34.0)
MCHC: 34.3 g/dL (ref 30.0–36.0)
MCV: 98.8 fL (ref 78.0–100.0)
Platelets: 237 10*3/uL (ref 150–400)
RBC: 3.36 MIL/uL — ABNORMAL LOW (ref 4.22–5.81)
RDW: 15.5 % (ref 11.5–15.5)
WBC: 10.2 10*3/uL (ref 4.0–10.5)

## 2017-12-22 LAB — BASIC METABOLIC PANEL
Anion gap: 8 (ref 5–15)
BUN: 18 mg/dL (ref 6–20)
CALCIUM: 8.3 mg/dL — AB (ref 8.9–10.3)
CO2: 19 mmol/L — AB (ref 22–32)
Chloride: 114 mmol/L — ABNORMAL HIGH (ref 101–111)
Creatinine, Ser: 1.18 mg/dL (ref 0.61–1.24)
GFR calc non Af Amer: 55 mL/min — ABNORMAL LOW (ref >60)
Glucose, Bld: 124 mg/dL — ABNORMAL HIGH (ref 65–99)
Potassium: 4 mmol/L (ref 3.5–5.1)
Sodium: 141 mmol/L (ref 135–145)

## 2017-12-22 LAB — ANTINUCLEAR ANTIBODIES, IFA: ANA Ab, IFA: NEGATIVE

## 2017-12-22 MED ORDER — ENOXAPARIN SODIUM 40 MG/0.4ML ~~LOC~~ SOLN
40.0000 mg | SUBCUTANEOUS | Status: DC
  Administered 2017-12-22 – 2017-12-23 (×2): 40 mg via SUBCUTANEOUS
  Filled 2017-12-22 (×2): qty 0.4

## 2017-12-22 NOTE — Progress Notes (Signed)
Modified Barium Swallow Progress Note  Patient Details  Name: Lance Martin MRN: [id number] Date of Birth: [birth year]  Today's Date: 12/22/2017  Modified Barium Swallow completed.  Full report located under Chart Review in the Imaging Section.  Brief recommendations include the following:  Clinical Impression  Pt demonstrates a mild oropharyngeal dysphagia with structural difference given  appearance of bony protrusions on anterior cervical spine from C2-C6 at least. Pt observed to independently compensate for reduced UES opening by sustaining laryngeal elevation and closure over 2-3 instances of propulsion/hyoid excursion/UES opening to fully transit bolus. Minimal thin and solid residue clings to posterior pharyngeal wall and around epiglottis right bove where it contacts with the beginning of most significant protrusion of pharyngeal wall. Pt clears most residue with two swallows and a liquid wash. Pt may resume a regular diet and thin liquids with use of strategies with f/u from SLP. Did not advance solids given GI issues. MD may advance to regular thin if pt ready.    Swallow Evaluation Recommendations       SLP Diet Recommendations: Regular solids;Thin liquid   Liquid Administration via: Cup;Straw   Medication Administration: Whole meds with puree   Supervision: Patient able to self feed   Compensations: Slow rate;Small sips/bites;Follow solids with liquid;Multiple dry swallows after each bite/sip       Oral Care Recommendations: Oral care BID     Herbie [place], MA CCC-SLP [phone]    [name] 12/22/2017,2:10 PM

## 2017-12-22 NOTE — Clinical Social Work Note (Signed)
Clinical Social Work Assessment  Patient Details  Name: Lance Martin MRN: [id number] Date of Birth: 1932/09/06  Date of referral:  12/22/17               Reason for consult:  Facility Placement, Discharge Planning                Permission sought to share information with:  Facility Sport and exercise psychologist, Family Supports Permission granted to share information::  Yes, Verbal Permission Granted  Name::     Khalik Pewitt  Agency::  SNFs  Relationship::  daughter  Contact Information:  [phone]  Housing/Transportation Living arrangements for the past 2 months:  [place] of Information:  Patient Patient Interpreter Needed:  None Criminal Activity/Legal Involvement Pertinent to Current Situation/Hospitalization:  No - Comment as needed Significant Relationships:  Adult Children Lives with:  Adult Children Do you feel safe going back to the place where you live?  Yes Need for family participation in patient care:  No (Coment)  Care giving concerns: Patient from home with son. PT recommending SNF.   Social Worker assessment / plan: CSW met with patient at bedside. Patient alert and oriented. Nurse indicated patient's daughter, [name], works across the street from the hospital and was just here visiting - daughter prefers [facility] for SNF. CSW discussed SNF recommendation with patient. Patient agreeable to SNF, though did not have a preference on facility.  CSW sent out initial referrals. CSW will follow up with bed offers when available and support with discharge.  Employment status:  Retired Forensic scientist:  [employer] PT Recommendations:  [place] / Referral to community resources:  [place]  Patient/Family's Response to care: Patient appreciative of care.  Patient/Family's Understanding of and Emotional Response to Diagnosis, Current Treatment, and Prognosis: Patient with understanding of SNF  recommendation and agreeable to SNF.  Emotional Assessment Appearance:  Appears stated age Attitude/Demeanor/Rapport:  Engaged Affect (typically observed):  Accepting, Calm Orientation:  Oriented to Self, Oriented to Situation, Oriented to Place, Oriented to  Time Alcohol / Substance use:  Not Applicable Psych involvement (Current and /or in the community):  No (Comment)  Discharge Needs  Concerns to be addressed:  Discharge Planning Concerns, Care Coordination Readmission within the last 30 days:  No Current discharge risk:  Physical Impairment Barriers to Discharge:  Continued Medical Work up   Estanislado Emms, LCSW 12/22/2017, 1:27 PM

## 2017-12-22 NOTE — Progress Notes (Signed)
OT Cancellation Note  Patient Details Name: Lance Martin MRN: [id number] DOB: 1932/11/29   Cancelled Treatment:    Reason Eval/Treat Not Completed: Fatigue/lethargy limiting ability to participate. Attempted to see pt x 3 today (gone for MBS, eating lunch, now back to bed and fatigued). Will check back next available day  Britt Bottom 12/22/2017, 2:02 PM

## 2017-12-22 NOTE — NC FL2 (Signed)
[place] LEVEL OF CARE SCREENING TOOL     IDENTIFICATION  Patient Name: Lance Martin Birthdate: 22-Aug-1932 Sex: male Admission Date (Current Location): 12/17/2017  [facility] and [place] Number:  Herbalist and Address:  [facility], [place] [address] [place], [place] [address]      Provider Number: [id number]  Attending Physician Name and Address:  Lavina Hamman, MD  Relative Name and Phone Number:  Mickell Birdwell, daughter, [phone]    Current Level of Care: Hospital Recommended Level of Care: [place] Prior Approval Number:    Date Approved/Denied:   PASRR Number: [id number]  Discharge Plan: SNF    Current Diagnoses: Patient Active Problem List   Diagnosis Date Noted  . Abdominal pain, RUQ (right upper quadrant)   . Acute acalculous cholecystitis   . Pressure injury of skin 12/18/2017  . Abnormal LFTs (liver function tests) 12/17/2017  . Cholangitis 12/17/2017  . Sepsis ([place]) 12/17/2017  . Atrial fibrillation with RVR ([place]) 12/17/2017  . Essential hypertension 12/17/2017  . Alcohol abuse 12/17/2017  . Elevated troponin 12/17/2017  . Debility 12/17/2017  . Dehydration 12/17/2017  . Rectal bleeding 09/04/2017  . Nausea without vomiting 09/04/2017  . Sleep apnea with use of continuous positive airway pressure (CPAP) 03/19/2013  . Obstructive sleep apnea (adult) (pediatric) 02/05/2013  . Obstructive apnea   . Hypersomnia, persistent 01/01/2013  . Lab findings of hypoxemia     Orientation RESPIRATION BLADDER Height & Weight     Time, Self, Situation, Place  Normal Continent, External catheter Weight: 164 lb 1.6 oz (74.4 kg) Height:  5\' 8"  (172.7 cm)  BEHAVIORAL SYMPTOMS/MOOD NEUROLOGICAL BOWEL NUTRITION STATUS      Incontinent Diet(please see DC summary)  AMBULATORY STATUS COMMUNICATION OF NEEDS Skin   Extensive Assist Verbally PU Stage and Appropriate Care(PU stage I buttocks, foam  dressing)                       Personal Care Assistance Level of Assistance  Bathing, Feeding, Dressing Bathing Assistance: Maximum assistance Feeding assistance: Independent Dressing Assistance: Maximum assistance     Functional Limitations Info  Sight, Hearing, Speech Sight Info: Adequate Hearing Info: Adequate Speech Info: Adequate    SPECIAL CARE FACTORS FREQUENCY  PT (By licensed PT)     PT Frequency: 5x/week              Contractures Contractures Info: Not present    Additional Factors Info  Code Status, Allergies Code Status Info: DNR Allergies Info: Allopurinol, Ampicillin           Current Medications (12/22/2017):  This is the current hospital active medication list Current Facility-Administered Medications  Medication Dose Route Frequency Provider Last Rate Last Dose  . acetaminophen (TYLENOL) tablet 325 mg  325 mg Oral Q6H PRN Lavina Hamman, MD      . cephALEXin [facility]) capsule 500 mg  500 mg Oral Q12H Lavina Hamman, MD   500 mg at 12/22/17 0942  . enoxaparin (LOVENOX) injection 40 mg  40 mg Subcutaneous Q24H Laqueta Linden A, RPH   40 mg at 12/22/17 2694  . lactulose (CHRONULAC) 10 GM/15ML solution 10 g  10 g Oral Daily Vena Rua, PA-C   10 g at 12/21/17 8546  . metoprolol tartrate (LOPRESSOR) tablet 25 mg  25 mg Oral BID Josue Hector, MD   25 mg at 12/22/17 0942  . metroNIDAZOLE (FLAGYL) tablet  500 mg  500 mg Oral Q8H Lavina Hamman, MD   500 mg at 12/22/17 0606  . morphine 4 MG/ML injection 2 mg  2 mg Intravenous Q3H PRN Lavina Hamman, MD      . ondansetron [facility]) tablet 4 mg  4 mg Oral Q6H PRN Toy Baker, MD       Or  . ondansetron (ZOFRAN) injection 4 mg  4 mg Intravenous Q6H PRN Doutova, Anastassia, MD      . polyvinyl alcohol (LIQUIFILM TEARS) 1.4 % ophthalmic solution 1 drop  1 drop Left Eye PRN Lavina Hamman, MD      . potassium chloride 20 MEQ/15ML (10%) solution 40 mEq  40 mEq Oral Daily Skeet Simmer,  RPH   40 mEq at 12/22/17 7543  . RESOURCE THICKENUP CLEAR   Oral PRN Lavina Hamman, MD      . sodium chloride flush (NS) 0.9 % injection 5 mL  5 mL Intracatheter Q8H Jacqulynn Cadet, MD   5 mL at 12/22/17 0606  . traMADol (ULTRAM) tablet 50 mg  50 mg Oral Q6H PRN Lavina Hamman, MD         Discharge Medications: Please see discharge summary for a list of discharge medications.  Relevant Imaging Results:  Relevant Lab Results:   Additional Information SSN: [id number]  Estanislado Emms, LCSW

## 2017-12-22 NOTE — Progress Notes (Signed)
MEDICATION RELATED CONSULT NOTE - FOLLOW UP   Pharmacy Consult for Enoxaparin   Indication: VTE Prophylaxis  Allergies  Allergen Reactions  . Allopurinol Hives and Other (See Comments)    Cause renal failure   . Ampicillin Diarrhea and Nausea And Vomiting    Patient Measurements: Height: 5\' 8"  (172.7 cm) Weight: 164 lb 1.6 oz (74.4 kg) IBW/kg (Calculated) : 68.4  Vital Signs: Temp: 97.5 F (36.4 C) (05/13 0429) Temp Source: Oral (05/13 0429) BP: 129/66 (05/13 0429) Pulse Rate: 74 (05/13 0429) Intake/Output from previous day: 05/12 0701 - 05/13 0700 In: 800 [P.O.:600; IV Piggyback:200] Out: 910 [Urine:735; Drains:175] Intake/Output from this shift: No intake/output data recorded.  Labs: Recent Labs    12/20/17 0542 12/21/17 0530 12/22/17 0425  WBC 11.9* 9.2 10.2  HGB 11.8* 11.6* 11.4*  HCT 33.5* 33.4* 33.2*  PLT 177 219 237  CREATININE 1.31* 1.18 1.18  MG 1.9  --   --    Estimated Creatinine Clearance: 45.9 mL/min (by C-G formula based on SCr of 1.18 mg/dL).   Microbiology: Recent Results (from the past 720 hour(s))  Culture, blood (x 2)     Status: None (Preliminary result)   Collection Time: 12/17/17 11:06 PM  Result Value Ref Range Status   Specimen Description BLOOD RIGHT ANTECUBITAL  Final   Special Requests   Final    BOTTLES DRAWN AEROBIC AND ANAEROBIC Blood Culture adequate volume   Culture   Final    NO GROWTH 3 DAYS Performed at [facility], [address], [place], [place] [address]    Report Status PENDING  Incomplete  Culture, blood (x 2)     Status: None (Preliminary result)   Collection Time: 12/18/17  6:34 AM  Result Value Ref Range Status   Specimen Description BLOOD LEFT HAND  Final   Special Requests   Final    BOTTLES DRAWN AEROBIC AND ANAEROBIC Blood Culture adequate volume   Culture   Final    NO GROWTH 3 DAYS Performed at [facility], [place] [address] [place], [place] [address]    Report Status PENDING  Incomplete   MRSA PCR Screening     Status: None   Collection Time: 12/19/17  9:20 AM  Result Value Ref Range Status   MRSA by PCR NEGATIVE NEGATIVE Final    Comment:        The GeneXpert MRSA Assay (FDA approved for NASAL specimens only), is one component of a comprehensive MRSA colonization surveillance program. It is not intended to diagnose MRSA infection nor to guide or monitor treatment for MRSA infections. Performed at [facility], [place] [address], [place], [place] [address]   Aerobic/Anaerobic Culture (surgical/deep wound)     Status: Abnormal (Preliminary result)   Collection Time: 12/19/17  2:58 PM  Result Value Ref Range Status   Specimen Description BILE  Final   Special Requests NONE  Final   Gram Stain   Final    ABUNDANT WBC PRESENT, PREDOMINANTLY PMN FEW GRAM POSITIVE COCCI FEW GRAM VARIABLE ROD Performed at [facility], [place] [address] [place], [place] [address]    Culture (A)  Final    MULTIPLE ORGANISMS PRESENT, NONE PREDOMINANT NO ANAEROBES ISOLATED; CULTURE IN PROGRESS FOR 5 DAYS    Report Status PENDING  Incomplete    Assessment: 84 yo male s/p cholecystostomy tube for cholecystitis Renal function stable  Plan:  Initiate Lovenox 40 mg subq q24hrs  Corinda Gubler 12/22/2017,8:54  AM

## 2017-12-22 NOTE — Progress Notes (Signed)
Physical Therapy Treatment Patient Details Name: Lance Martin MRN: [id number] DOB: [birth year] Today's Date: 12/22/2017    History of Present Illness This 84 y.o. male admitted with nausea, abdominal pain,  as well as generalized weakness.  He was found to have acute cholecystitis, and new diagnosis of cirrhosis.  PMH:  Gout, Rt shoulder impingement syndrome, h/o SDH, prostate CA, s/p Lt THA      PT Comments    Pt admitted with above diagnosis. Pt currently with functional limitations due to balance and endurance deficits. Pt was able to ambulate with RW 125 feet with mod to min assist of 2 persons with chair follow.  Progressing but slowly.  Pt will benefit from skilled PT to increase their independence and safety with mobility to allow discharge to the venue listed below.     Follow Up Recommendations  Supervision/Assistance - 24 hour;SNF     Equipment Recommendations  None recommended by PT    Recommendations for Other Services       Precautions / Restrictions Precautions Precautions: Fall Precaution Comments: chole drain Restrictions Weight Bearing Restrictions: No    Mobility  Bed Mobility Overal bed mobility: Needs Assistance Bed Mobility: Rolling;Sidelying to Sit Rolling: Mod assist;+2 for physical assistance Sidelying to sit: Mod assist;+2 for physical assistance       General bed mobility comments: Assist to initiate moving LEs off bed and assist to lift trunk from bed   Transfers Overall transfer level: Needs assistance Equipment used: Rolling walker (2 wheeled) Transfers: Sit to/from [facility] Sit to Stand: Mod assist;+2 physical assistance Stand pivot transfers: Mod assist;+2 physical assistance       General transfer comment: assist to power up into standing, assist for balance, and assist to maneuver RW.  Needed mod assist to steady while standing.    Ambulation/Gait Ambulation/Gait assistance: Mod assist;+2 physical  assistance Ambulation Distance (Feet): 125 Feet Assistive device: Rolling walker (2 wheeled) Gait Pattern/deviations: Step-to pattern;Decreased stride length;Shuffle;Trunk flexed;Leaning posteriorly;Staggering left;Staggering right;Narrow base of support;Decreased step length - right;Decreased step length - left   Gait velocity interpretation: <1.31 ft/sec, indicative of household ambulator General Gait Details: very slow to initiate stepping and needed encouragement to continue, difficulty getting any clearance for stepping, more of a shuffling, scooting pattern with lots of hesitation to lift his feet, improved some over time.  Followed with chair.  Cues to stay close to RW as well.    Stairs             Wheelchair Mobility    Modified Rankin (Stroke Patients Only)       Balance Overall balance assessment: Needs assistance Sitting-balance support: Feet supported;No upper extremity supported Sitting balance-Leahy Scale: Fair Sitting balance - Comments: static sitting with min guard assist EOB    Standing balance support: Bilateral upper extremity supported Standing balance-Leahy Scale: Poor Standing balance comment: requires bil. UE support and mod A                             Cognition Arousal/Alertness: Awake/alert Behavior During Therapy: Flat affect Overall Cognitive Status: Impaired/Different from baseline Area of Impairment: Attention;Following commands;Problem solving                   Current Attention Level: Selective   Following Commands: Follows one step commands consistently;Follows one step commands inconsistently     Problem Solving: Slow processing;Decreased initiation;Difficulty sequencing;Requires verbal cues;Requires tactile cues General Comments: Pt has hearing  loss, making it a bit difficult to accurately assess cognition.  He is slow to initiate movement, and demonstrates difficulty with sequencing activity       Exercises  General Exercises - Lower Extremity Ankle Circles/Pumps: AROM;Both;10 reps;Supine Long Arc Quad: AROM;Both;10 reps;Seated    General Comments        Pertinent Vitals/Pain Pain Assessment: No/denies pain    Home Living                      Prior Function            PT Goals (current goals can now be found in the care plan section) Acute Rehab PT Goals Patient Stated Goal: to regain independence  Progress towards PT goals: Progressing toward goals    Frequency    Min 3X/week      PT Plan Current plan remains appropriate    Co-evaluation              AM-PAC PT "6 Clicks" Daily Activity  Outcome Measure  Difficulty turning over in bed (including adjusting bedclothes, sheets and blankets)?: A Lot Difficulty moving from lying on back to sitting on the side of the bed? : Unable Difficulty sitting down on and standing up from a chair with arms (e.g., wheelchair, bedside commode, etc,.)?: Unable Help needed moving to and from a bed to chair (including a wheelchair)?: A Lot Help needed walking in hospital room?: A Lot Help needed climbing 3-5 steps with a railing? : Total 6 Click Score: 9    End of Session Equipment Utilized During Treatment: Gait belt Activity Tolerance: Patient limited by fatigue Patient left: with call bell/phone within reach;in chair;with chair alarm set Nurse Communication: Mobility status PT Visit Diagnosis: Other abnormalities of gait and mobility (R26.89)     Time: 1133-1150 PT Time Calculation (min) (ACUTE ONLY): 17 min  Charges:  $Gait Training: 8-22 mins                    G Codes:       Mckaila Duffus,PT Acute Rehabilitation [place] 12/22/2017, 3:38 PM

## 2017-12-22 NOTE — Plan of Care (Signed)
  Problem: Activity: Goal: Risk for activity intolerance will decrease Outcome: Progressing   

## 2017-12-22 NOTE — Progress Notes (Signed)
[facility] Progress Note  Patient: Lance Martin:[id number]   PCP: Crist Infante, MD DOB: [birth year]   DOA: 12/17/2017   DOS: 12/22/2017   Date of Service: the patient was seen and examined on 12/22/2017  Subjective: Feeling better.  No nausea no vomiting.  Brief hospital course: Pt. with PMH of HTN, HLD, gout, prostate cancer, hemorrhagic cystitis S/P fulguration, GERD; admitted on 12/17/2017, presented with complaint of abdominal pain, was found to have acute cholecystitis. Currently further plan is conservative management.  Assessment and Plan: 1.  Acute cholecystitis. Presented with abdominal pain, GI was consulted, MRCP was performed, CBD did not show any stone, there were also no gallstones.  Gallbladder was thickened. GI currently has signed off recommending no further work-up from their perspective. General surgery was consulted by GI for cholecystitis,  Surgery feels that the patient would be at high risk and therefore recommended percutaneous cholecystostomy tube placement. She has some pleuritic chest pain after the insertion although repeat her chest x-ray shows no evidence of acute abnormality. Culture from the drain ER growing multiple species, no anaerobes.  Patient on IV cefepime and Flagyl, will transition him to oral Keflex and Flagyl.  2.  Elevated troponin. Cardiology was consulted. Preoperative evaluation suggest patient will be at moderate risk for surgery. Elevated troponin likely demand ischemia and A. fib. No further work-up recommended.  3.  Sinus tachycardia. Likely in the response of infection. Initially thought to be A. fib on admission although it appears that PAC burden rather than active A. fib.  Started on beta-blocker.  Monitor. Patient was started on anticoagulation. Discontinue heparin.  4. Essential hypertension. Blood pressure soft. Patient on 3 blood pressure medications at home, currently on hold.  Rate control with beta-blocker  initiated.  5.  New diagnosis of cirrhosis. As per GI likely combination of alcohol induced and [place]. Outpatient follow-up recommended by GI. CPC class C, MELD 24. Recommend alcohol cessation.  6.  Cough. Chest x-ray performed to verify the position of the cholecystostomy tube. Overlies right upper abdominal quadrant. Will initiate on incentive spirometry.  7.  Dysphagia. Speech therapy consulted, recommend MBS.   MBS shows no acute abnormality. Speech therapy recommends regular diet. GI has recommended esophagogram in the past.  Outpatient follow-up with GI.  8.  Constipation. On lactulose.  Diet: Heart healthy diet DVT Prophylaxis: subcutaneous Heparin  Advance goals of care discussion: dnr dni  Family Communication: no family was present at bedside, at the time of interview.  Disposition:  Discharge to SNF, social worker consulted  Consultants: gastroenterology General surgery IR Procedures: none  Antibiotics: Anti-infectives (From admission, onward)   Start     Dose/Rate Route Frequency Ordered Stop   12/21/17 1515  cephALEXin (KEFLEX) capsule 500 mg     500 mg Oral Every 12 hours 12/21/17 1513     12/21/17 1515  metroNIDAZOLE (FLAGYL) tablet 500 mg     500 mg Oral Every 8 hours 12/21/17 1513     12/21/17 0933  ceFEPIme (MAXIPIME) 2 g in sodium chloride 0.9 % 100 mL IVPB  Status:  Discontinued     2 g 200 mL/hr over 30 Minutes Intravenous Every 24 hours 12/20/17 1440 12/21/17 1512   12/18/17 1000  ceFEPIme (MAXIPIME) 2 g in sodium chloride 0.9 % 100 mL IVPB  Status:  Discontinued     2 g 200 mL/hr over 30 Minutes Intravenous Every 12 hours 12/17/17 2220 12/20/17 1440   12/18/17 0600  metroNIDAZOLE (FLAGYL) IVPB 500 mg  Status:  Discontinued     500 mg 100 mL/hr over 60 Minutes Intravenous Every 8 hours 12/17/17 2220 12/21/17 1512   12/17/17 2215  ceFEPIme (MAXIPIME) 2 g in sodium chloride 0.9 % 100 mL IVPB     2 g 200 mL/hr over 30 Minutes Intravenous  Once  12/17/17 2203 12/18/17 0206   12/17/17 2200  ciprofloxacin (CIPRO) IVPB 400 mg  Status:  Discontinued     400 mg 200 mL/hr over 60 Minutes Intravenous  Once 12/17/17 2149 12/17/17 2203   12/17/17 2200  metroNIDAZOLE (FLAGYL) IVPB 500 mg     500 mg 100 mL/hr over 60 Minutes Intravenous  Once 12/17/17 2149 12/18/17 0206       Objective: Physical Exam: Vitals:   12/21/17 1422 12/21/17 2100 12/22/17 0429 12/22/17 1434  BP: 133/64 137/76 129/66 124/69  Pulse: 68 77 74 67  Resp: 17 (!) 21 20 (!) 21  Temp: 98.8 F (37.1 C) 98.7 F (37.1 C) (!) 97.5 F (36.4 C) 98.6 F (37 C)  TempSrc: Oral Oral Oral Oral  SpO2: 98% 98% 97% 98%  Weight:   74.4 kg (164 lb 1.6 oz)   Height:        Intake/Output Summary (Last 24 hours) at 12/22/2017 1609 Last data filed at 12/22/2017 1446 Gross per 24 hour  Intake 600 ml  Output 860 ml  Net -260 ml   Filed Weights   12/20/17 0523 12/21/17 0600 12/22/17 0429  Weight: 69.8 kg (153 lb 14.4 oz) 74.5 kg (164 lb 3.2 oz) 74.4 kg (164 lb 1.6 oz)   General: Alert, Awake and Oriented to Time, Place and Person. Appear in mild distress, affect appropriate Eyes: PERRL, Conjunctiva normal ENT: Oral Mucosa clear moist. Neck: no JVD, no Abnormal Mass Or lumps Cardiovascular: S1 and S2 Present, aortic systolic Murmur, Peripheral Pulses Present Respiratory: normal respiratory effort, Bilateral Air entry equal and Decreased, no use of accessory muscle, Clear to Auscultation, no Crackles, no wheezes Abdomen: Bowel Sound present, Soft and mild tenderness, no hernia Skin: no redness, no Rash, no induration Extremities: no Pedal edema, no calf tenderness Neurologic: Grossly no focal neuro deficit. Bilaterally Equal motor strength  Data Reviewed: CBC: Recent Labs  Lab 12/17/17 1521  12/18/17 0935 12/19/17 0308 12/20/17 0542 12/21/17 0530 12/22/17 0425  WBC 18.2*   < > 21.3* 17.9* 11.9* 9.2 10.2  NEUTROABS 14.0  --   --  14.3*  --   --   --   HGB 13.6   < >  12.2* 11.4* 11.8* 11.6* 11.4*  HCT 37.7*   < > 34.8* 31.7* 33.5* 33.4* 33.2*  MCV 97.9   < > 97.2 96.9 98.5 97.9 98.8  PLT 176   < > 146* 161 177 219 237   < > = values in this interval not displayed.   Basic Metabolic Panel: Recent Labs  Lab 12/18/17 0651 12/19/17 0308 12/20/17 0542 12/21/17 0530 12/22/17 0425  NA 135 136 139 142 141  K 3.2* 3.0* 4.0 4.1 4.0  CL 100* 105 110 113* 114*  CO2 22 22 20* 21* 19*  GLUCOSE 77 78 87 83 124*  BUN 43* 37* 31* 23* 18  CREATININE 1.78* 1.58* 1.31* 1.18 1.18  CALCIUM 8.2* 7.8* 8.0* 8.4* 8.3*  MG 1.8 1.8 1.9  --   --   PHOS 3.8  --   --   --   --     Liver Function Tests: Recent Labs  Lab 12/17/17 1823 12/18/17 6387  12/19/17 0308  AST 105* 86* 80*  ALT 64* 56 49  ALKPHOS 196* 167* 183*  BILITOT 5.4* 5.4* 4.5*  PROT 5.7* 5.2* 4.9*  ALBUMIN 2.6* 2.4* 2.1*   Recent Labs  Lab 12/17/17 1823  LIPASE 121*   Recent Labs  Lab 12/19/17 0308  AMMONIA 43*   Coagulation Profile: Recent Labs  Lab 12/17/17 2123 12/18/17 0651 12/19/17 0647  INR 1.45 1.72 1.76   Cardiac Enzymes: Recent Labs  Lab 12/17/17 2123 12/18/17 0651 12/18/17 0935  TROPONINI 0.10* 0.03* <0.03   BNP (last 3 results) No results for input(s): PROBNP in the last 8760 hours. CBG: No results for input(s): GLUCAP in the last 168 hours. Studies: Dg Swallowing Func-speech Pathology  Result Date: 12/22/2017 Objective Swallowing Evaluation: Type of Study: MBS-Modified Barium Swallow Study  Patient Details Name: Lance Martin MRN: [id number] Date of Birth: Nov 24, 1932 Today's Date: 12/22/2017 Time: SLP Start Time (ACUTE ONLY): 1030 -SLP Stop Time (ACUTE ONLY): 1100 SLP Time Calculation (min) (ACUTE ONLY): 30 min Past Medical History: Past Medical History: Diagnosis Date . Arthritis  . AV block 05/13 ecg  1st degree . Bleeding   bladder,radiation cystitis-s/p cystoscopy, clot evacuation, fulgeration of bleeders . Cancer ([place])   prostate, hx of bsal cell skin cancer  .  Cataract  . Gout  . Hematuria  . Hemorrhoid  . History of basal cell carcinoma excision 08-29-2011  left frontal scalp . History of gout PER PT -  STABLE . History of prostate cancer 2004   -  RX EXTERNAL RADIATION . Hx of subdural hematoma 08-14-2011    s/p total left hip  12-92012---  resolved w/ no residual . Hyperlipidemia  . Hypertension  . IFG (impaired fasting glucose)  . Lab findings of hypoxemia   abnormal ONO , dr perini. 05-26-2012 . Microalbuminuria 2011 . OA (osteoarthritis) of hip  . Obstructive apnea   AHI of 35 on 01-14-13 , titrated to 6 cm H20 . Pneumonia   hx of 2015  . Shoulder impingement syndrome   right . Sleep apnea   cpap nightly . Sleep apnea with use of continuous positive airway pressure (CPAP) 03/19/2013 Past Surgical History: Past Surgical History: Procedure Laterality Date . CARDIAC CATHETERIZATION    pt denies . COLONOSCOPY   . CYSTOSCOPY WITH BIOPSY  07/03/2012  Procedure: CYSTOSCOPY WITH BIOPSY;  Surgeon: Franchot Gallo, MD;  [facility];  Service: Urology;  Laterality: N/A;   . CYSTOSCOPY WITH FULGERATION N/A 12/02/2016  Procedure: CYSTOSCOPY WITH FULGERATION OF BLEEDERS AND TRANSURETRAL INCISION OF PROSTATE;  Surgeon: Franchot Gallo, MD;  Location: [facility];  Service: Urology;  Laterality: N/A; . EYE SURGERY  2009  bilateral cataract surgery  . IR PERC CHOLECYSTOSTOMY  12/19/2017 . JOINT REPLACEMENT   . MOHS SURGERY   . PROSTATE SURGERY   . RIGHT ELBOW SURGERY  MAR 2013 . right elbow surgery  2009  ELBOW AND WRIST SURGERY . TONSILLECTOMY    AGE 26 . TOTAL HIP ARTHROPLASTY  07/31/2011  Procedure: TOTAL HIP ARTHROPLASTY;  Surgeon: Dione Plover Aluisio;  Location: [facility];  Service: Orthopedics;  Laterality: Left; . TOTAL HIP ARTHROPLASTY   . TRANSURETHRAL RESECTION OF PROSTATE  07/25/2012  Procedure: TRANSURETHRAL RESECTION OF THE PROSTATE WITH GYRUS INSTRUMENTS;  Surgeon: Alexis Frock, MD;  Location: [facility];  Service: Urology;  Laterality: N/A;  CYSTOSCOPY;CLOT  EVACUATION WITH FULGERATION . ULNAR NERVE REPAIR  04/13 HPI: Pt. with PMH of HTN, HLD, gout, prostate cancer, hemorrhagic cystitis S/P fulguration,  GERD; admitted on 12/17/2017, presented with complaint of abdominal pain, was found to have acute cholecystitis, s/p percutaneous cholecystostomy 12/19/17; on clears per surgery to be advanced as tolerated. Per GI pt with recent trouble swallowing with increased regurgitation prior to admission. GI recommending dysphagia w/u with esophagram, likely next week.  Subjective: Awake in bed Assessment / Plan / Recommendation CHL IP CLINICAL IMPRESSIONS 12/22/2017 Clinical Impression Pt demonstrates a mild oropharyngeal dysphagia with structural difference given  appearance of bony protrusions on anterior cervical spine from C2-C6 at least. Pt observed to independently compensate for reduced UES opening by sustaining laryngeal elevation and closure over 2-3 instances of propulsion/hyoid excursion/UES opening to fully transit bolus. Minimal thin and solid residue clings to posterior pharyngeal wall and around epiglottis right bove where it contacts with the beginning of most significant protrusion of pharyngeal wall. Pt clears most residue with two swallows and a liquid wash. Pt may resume a regular diet and thin liquids with use of strategies with f/u from SLP. Did not advance solids given GI issues. MD may advance to regular thin if pt ready  SLP Visit Diagnosis Dysphagia, unspecified (R13.10) Attention and concentration deficit following -- Frontal lobe and executive function deficit following -- Impact on safety and function Mild aspiration risk   CHL IP TREATMENT RECOMMENDATION 12/22/2017 Treatment Recommendations Therapy as outlined in treatment plan below   Prognosis 12/22/2017 Prognosis for Safe Diet Advancement Good Barriers to Reach Goals -- Barriers/Prognosis Comment -- CHL IP DIET RECOMMENDATION 12/22/2017 SLP Diet Recommendations Regular solids;Thin liquid Liquid  Administration via Cup;Straw Medication Administration Whole meds with puree Compensations Slow rate;Small sips/bites;Follow solids with liquid;Multiple dry swallows after each bite/sip Postural Changes --   CHL IP OTHER RECOMMENDATIONS 12/22/2017 Recommended Consults -- Oral Care Recommendations Oral care BID Other Recommendations --   CHL IP FOLLOW UP RECOMMENDATIONS 12/22/2017 Follow up Recommendations Skilled Nursing facility   [facility] IP FREQUENCY AND DURATION 12/22/2017 Speech Therapy Frequency (ACUTE ONLY) min 2x/week Treatment Duration 2 weeks      No flowsheet data found. No flowsheet data found. No flowsheet data found. No flowsheet data found. Herbie Baltimore, MA CCC-SLP [phone] [place], Katherene Ponto 12/22/2017, 2:11 PM               Scheduled Meds: . cephALEXin  500 mg Oral Q12H  . enoxaparin (LOVENOX) injection  40 mg Subcutaneous Q24H  . lactulose  10 g Oral Daily  . metoprolol tartrate  25 mg Oral BID  . metroNIDAZOLE  500 mg Oral Q8H  . potassium chloride  40 mEq Oral Daily  . sodium chloride flush  5 mL Intracatheter Q8H   Continuous Infusions:  PRN Meds: acetaminophen, morphine injection, ondansetron **OR** ondansetron (ZOFRAN) IV, polyvinyl alcohol, RESOURCE THICKENUP CLEAR, traMADol  Time spent: 35 minutes  Author: Berle Mull, MD [facility] Pager: [phone] 12/22/2017 4:09 PM  If 7PM-7AM, please contact night-coverage at [email or web address], password [facility]

## 2017-12-22 NOTE — Care Management Important Message (Signed)
Important Message  Patient Details  Name: MASEN LUALLEN MRN: [id number] Date of Birth: 08/21/1932   Medicare Important Message Given:  Yes    Takila Kronberg P Joli Koob 12/22/2017, 2:46 PM

## 2017-12-22 NOTE — Consult Note (Signed)
[facility] CM Primary Care Navigator  12/22/2017  Lance Martin 05/25/1933 [id number]   Met withpatient at the bedside to identify possible discharge needs.  Patient reportshaving "weakness" and complaints of abdominal pain, and was found to have acute cholecystitisthatresultedto this admission.  PatientendorsesDr. Crist Infante with [facility] as [facility] primary careprovider.  [facility] [facility] and [facility] serviceto obtain medications without difficulty. Patient verbalizedthathehas beenmanaging his ownmedications at homeusing "pillbox" system filled once a week.  Patientreports that he has been driving prior to admission, but son [name]) and daughter [name]) willbe able to providetransportation to hisdoctors' appointments after discharge.  Patient mentioned that son lives with him who can provide assistance with his needs when he gets back home.   Anticipated discharge plan is skilled nursing facility (SNF-in process) per therapy recommendation.  Patientexpressed understanding to callprimary care provider's office when hereturnsbackhomefor a post discharge follow-up appointment within1- 2weeksor sooner if needed. Patient letter (with PCP's contact number) was provided as a reminder.  Explained to [facility]about [facility] CM services available for health management at home buthedenies anyneeds or concerns at thistime. Patient voiced understandingto seek referral from primary care provider to [facility] care management as deemed necessary and appropriate foranyservices in the future- once discharged back home.  [facility] care management information provided for future needs thatmay arise.   For additional questions please contact:  Edwena Felty A. Shawanda Sievert, BSN, RN-BC [facility] PRIMARY CARE Navigator Cell: [phone]

## 2017-12-23 DIAGNOSIS — I6521 Occlusion and stenosis of right carotid artery: Secondary | ICD-10-CM | POA: Diagnosis not present

## 2017-12-23 DIAGNOSIS — N179 Acute kidney failure, unspecified: Secondary | ICD-10-CM | POA: Diagnosis not present

## 2017-12-23 DIAGNOSIS — A419 Sepsis, unspecified organism: Secondary | ICD-10-CM | POA: Diagnosis present

## 2017-12-23 DIAGNOSIS — R0602 Shortness of breath: Secondary | ICD-10-CM | POA: Diagnosis not present

## 2017-12-23 DIAGNOSIS — Z96642 Presence of left artificial hip joint: Secondary | ICD-10-CM | POA: Diagnosis present

## 2017-12-23 DIAGNOSIS — R262 Difficulty in walking, not elsewhere classified: Secondary | ICD-10-CM | POA: Diagnosis not present

## 2017-12-23 DIAGNOSIS — M2550 Pain in unspecified joint: Secondary | ICD-10-CM | POA: Diagnosis not present

## 2017-12-23 DIAGNOSIS — K7031 Alcoholic cirrhosis of liver with ascites: Secondary | ICD-10-CM | POA: Diagnosis present

## 2017-12-23 DIAGNOSIS — R0682 Tachypnea, not elsewhere classified: Secondary | ICD-10-CM | POA: Diagnosis not present

## 2017-12-23 DIAGNOSIS — E785 Hyperlipidemia, unspecified: Secondary | ICD-10-CM | POA: Diagnosis not present

## 2017-12-23 DIAGNOSIS — K210 Gastro-esophageal reflux disease with esophagitis: Secondary | ICD-10-CM | POA: Diagnosis present

## 2017-12-23 DIAGNOSIS — Z9079 Acquired absence of other genital organ(s): Secondary | ICD-10-CM | POA: Diagnosis not present

## 2017-12-23 DIAGNOSIS — K219 Gastro-esophageal reflux disease without esophagitis: Secondary | ICD-10-CM | POA: Diagnosis not present

## 2017-12-23 DIAGNOSIS — J189 Pneumonia, unspecified organism: Secondary | ICD-10-CM | POA: Diagnosis not present

## 2017-12-23 DIAGNOSIS — Z85828 Personal history of other malignant neoplasm of skin: Secondary | ICD-10-CM | POA: Diagnosis not present

## 2017-12-23 DIAGNOSIS — Z515 Encounter for palliative care: Secondary | ICD-10-CM | POA: Diagnosis not present

## 2017-12-23 DIAGNOSIS — R1312 Dysphagia, oropharyngeal phase: Secondary | ICD-10-CM | POA: Diagnosis not present

## 2017-12-23 DIAGNOSIS — R1310 Dysphagia, unspecified: Secondary | ICD-10-CM | POA: Diagnosis not present

## 2017-12-23 DIAGNOSIS — R2681 Unsteadiness on feet: Secondary | ICD-10-CM | POA: Diagnosis not present

## 2017-12-23 DIAGNOSIS — E870 Hyperosmolality and hypernatremia: Secondary | ICD-10-CM | POA: Diagnosis present

## 2017-12-23 DIAGNOSIS — M6281 Muscle weakness (generalized): Secondary | ICD-10-CM | POA: Diagnosis not present

## 2017-12-23 DIAGNOSIS — I10 Essential (primary) hypertension: Secondary | ICD-10-CM | POA: Diagnosis not present

## 2017-12-23 DIAGNOSIS — R488 Other symbolic dysfunctions: Secondary | ICD-10-CM | POA: Diagnosis not present

## 2017-12-23 DIAGNOSIS — K819 Cholecystitis, unspecified: Secondary | ICD-10-CM | POA: Diagnosis not present

## 2017-12-23 DIAGNOSIS — J9601 Acute respiratory failure with hypoxia: Secondary | ICD-10-CM | POA: Diagnosis present

## 2017-12-23 DIAGNOSIS — F1010 Alcohol abuse, uncomplicated: Secondary | ICD-10-CM | POA: Diagnosis not present

## 2017-12-23 DIAGNOSIS — Z8546 Personal history of malignant neoplasm of prostate: Secondary | ICD-10-CM | POA: Diagnosis not present

## 2017-12-23 DIAGNOSIS — K810 Acute cholecystitis: Secondary | ICD-10-CM | POA: Diagnosis not present

## 2017-12-23 DIAGNOSIS — Z923 Personal history of irradiation: Secondary | ICD-10-CM | POA: Diagnosis not present

## 2017-12-23 DIAGNOSIS — F411 Generalized anxiety disorder: Secondary | ICD-10-CM | POA: Diagnosis not present

## 2017-12-23 DIAGNOSIS — R0603 Acute respiratory distress: Secondary | ICD-10-CM | POA: Diagnosis not present

## 2017-12-23 DIAGNOSIS — K117 Disturbances of salivary secretion: Secondary | ICD-10-CM | POA: Diagnosis not present

## 2017-12-23 DIAGNOSIS — M109 Gout, unspecified: Secondary | ICD-10-CM | POA: Diagnosis not present

## 2017-12-23 DIAGNOSIS — R58 Hemorrhage, not elsewhere classified: Secondary | ICD-10-CM | POA: Diagnosis present

## 2017-12-23 DIAGNOSIS — G4733 Obstructive sleep apnea (adult) (pediatric): Secondary | ICD-10-CM | POA: Diagnosis present

## 2017-12-23 DIAGNOSIS — K7030 Alcoholic cirrhosis of liver without ascites: Secondary | ICD-10-CM | POA: Diagnosis not present

## 2017-12-23 DIAGNOSIS — M1A00X0 Idiopathic chronic gout, unspecified site, without tophus (tophi): Secondary | ICD-10-CM | POA: Diagnosis not present

## 2017-12-23 DIAGNOSIS — R531 Weakness: Secondary | ICD-10-CM | POA: Diagnosis not present

## 2017-12-23 DIAGNOSIS — Z66 Do not resuscitate: Secondary | ICD-10-CM | POA: Diagnosis present

## 2017-12-23 DIAGNOSIS — R0902 Hypoxemia: Secondary | ICD-10-CM | POA: Diagnosis not present

## 2017-12-23 DIAGNOSIS — R000 Tachycardia, unspecified: Secondary | ICD-10-CM | POA: Diagnosis not present

## 2017-12-23 DIAGNOSIS — Z7401 Bed confinement status: Secondary | ICD-10-CM | POA: Diagnosis not present

## 2017-12-23 DIAGNOSIS — J690 Pneumonitis due to inhalation of food and vomit: Secondary | ICD-10-CM | POA: Diagnosis present

## 2017-12-23 DIAGNOSIS — R05 Cough: Secondary | ICD-10-CM | POA: Diagnosis not present

## 2017-12-23 DIAGNOSIS — I700 Atherosclerosis of aorta: Secondary | ICD-10-CM | POA: Diagnosis not present

## 2017-12-23 LAB — CBC
HEMATOCRIT: 34.6 % — AB (ref 39.0–52.0)
HEMOGLOBIN: 12 g/dL — AB (ref 13.0–17.0)
MCH: 34.8 pg — AB (ref 26.0–34.0)
MCHC: 34.7 g/dL (ref 30.0–36.0)
MCV: 100.3 fL — AB (ref 78.0–100.0)
Platelets: 253 10*3/uL (ref 150–400)
RBC: 3.45 MIL/uL — ABNORMAL LOW (ref 4.22–5.81)
RDW: 15.9 % — AB (ref 11.5–15.5)
WBC: 11.6 10*3/uL — ABNORMAL HIGH (ref 4.0–10.5)

## 2017-12-23 LAB — BASIC METABOLIC PANEL
Anion gap: 7 (ref 5–15)
BUN: 16 mg/dL (ref 6–20)
CHLORIDE: 114 mmol/L — AB (ref 101–111)
CO2: 21 mmol/L — AB (ref 22–32)
CREATININE: 1.14 mg/dL (ref 0.61–1.24)
Calcium: 8.5 mg/dL — ABNORMAL LOW (ref 8.9–10.3)
GFR calc Af Amer: >60 mL/min (ref >60)
GFR calc non Af Amer: 58 mL/min — ABNORMAL LOW (ref >60)
GLUCOSE: 97 mg/dL (ref 65–99)
Potassium: 4 mmol/L (ref 3.5–5.1)
Sodium: 142 mmol/L (ref 135–145)

## 2017-12-23 LAB — CULTURE, BLOOD (ROUTINE X 2)
Culture: NO GROWTH
Culture: NO GROWTH
SPECIAL REQUESTS: ADEQUATE
SPECIAL REQUESTS: ADEQUATE

## 2017-12-23 MED ORDER — TRAMADOL HCL 50 MG PO TABS: 50.0000 mg | ORAL_TABLET | Freq: Four times a day (QID) | ORAL | 0 refills | Status: AC | PRN

## 2017-12-23 MED ORDER — POTASSIUM CHLORIDE 20 MEQ/15ML (10%) PO SOLN: 20.0000 meq | Freq: Every day | ORAL | 0 refills | Status: AC

## 2017-12-23 MED ORDER — METOPROLOL TARTRATE 25 MG PO TABS: 25.0000 mg | ORAL_TABLET | Freq: Two times a day (BID) | ORAL | 0 refills | Status: AC

## 2017-12-23 MED ORDER — CEPHALEXIN 500 MG PO CAPS: 500.0000 mg | ORAL_CAPSULE | Freq: Two times a day (BID) | ORAL | 0 refills | Status: AC

## 2017-12-23 MED ORDER — METRONIDAZOLE 500 MG PO TABS: 500.0000 mg | ORAL_TABLET | Freq: Three times a day (TID) | ORAL | 0 refills | Status: AC

## 2017-12-23 MED ORDER — LACTULOSE 10 GM/15ML PO SOLN: 10.0000 g | Freq: Every day | ORAL | 0 refills | Status: AC

## 2017-12-23 NOTE — Progress Notes (Signed)
Occupational Therapy Treatment Patient Details Name: Lance Martin MRN: [id number] DOB: [birth year] Today's Date: 12/23/2017    History of present illness This 84 y.o. male admitted with nausea, abdominal pain,  as well as generalized weakness.  He was found to have acute cholecystitis, and new diagnosis of cirrhosis.  PMH:  Gout, Rt shoulder impingement syndrome, h/o SDH, prostate CA, s/p Lt THA     OT comments  Pt making progress with functional goals and will now d/c to a SNF for further mgt and rehab after acute d/c. OT will continue to follow acutely  Follow Up Recommendations  SNF    Equipment Recommendations  Other (comment)(TBD at next venue of care)    Recommendations for Other Services      Precautions / Restrictions Precautions Precautions: Fall Precaution Comments: chole drain Restrictions Weight Bearing Restrictions: No       Mobility Bed Mobility Overal bed mobility: Needs Assistance Bed Mobility: Rolling;Sidelying to Sit Rolling: Mod assist Sidelying to sit: Mod assist       General bed mobility comments: Assist to initiate moving LEs off bed and assist to elevate trunk from    Transfers                      Balance Overall balance assessment: Needs assistance Sitting-balance support: Feet supported;No upper extremity supported Sitting balance-Leahy Scale: Fair     Standing balance support: Bilateral upper extremity supported;During functional activity Standing balance-Leahy Scale: Poor                             ADL either performed or assessed with clinical judgement   ADL Overall ADL's : Needs assistance/impaired     Grooming: Wash/dry hands;Wash/dry face;Brushing hair;Set up;Supervision/safety;Sitting   Upper Body Bathing: Sitting;Min guard Upper Body Bathing Details (indicate cue type and reason): simulated Lower Body Bathing: Maximal assistance;Sit to/from stand;Moderate assistance Lower Body Bathing Details  (indicate cue type and reason): simulated         Toilet Transfer: Moderate assistance;Ambulation;RW Toilet Transfer Details (indicate cue type and reason): siumulated from bed - recliner SPT using RW Toileting- Clothing Manipulation and Hygiene: Sit to/from stand;Moderate assistance       Functional mobility during ADLs: Moderate assistance;Rolling walker       Vision Baseline Vision/History: Wears glasses Wears Glasses: At all times     Perception     Praxis      Cognition Arousal/Alertness: Awake/alert Behavior During Therapy: Flat affect Overall Cognitive Status: Impaired/Different from baseline Area of Impairment: Attention;Following commands;Problem solving                       Following Commands: Follows one step commands consistently;Follows one step commands inconsistently     Problem Solving: Slow processing;Decreased initiation;Difficulty sequencing;Requires verbal cues;Requires tactile cues General Comments: Pt has hearing loss, making it a bit difficult to accurately assess cognition.  He is slow to initiate movement, and demonstrates difficulty with sequencing activity         Exercises     Shoulder Instructions       General Comments      Pertinent Vitals/ Pain       Pain Assessment: 0-10 Pain Score: 5  Pain Location: R abdomen Pain Descriptors / Indicators: Aching;Sore Pain Intervention(s): Monitored during session;Repositioned  Home Living  Prior Functioning/Environment              Frequency  Min 2X/week        Progress Toward Goals  OT Goals(current goals can now be found in the care plan section)  Progress towards OT goals: Progressing toward goals  Acute Rehab OT Goals Patient Stated Goal: to regain independence   Plan Discharge plan needs to be updated    Co-evaluation                 AM-PAC PT "6 Clicks" Daily Activity     Outcome  Measure   Help from another person eating meals?: A Little Help from another person taking care of personal grooming?: A Little Help from another person toileting, which includes using toliet, bedpan, or urinal?: A Lot Help from another person bathing (including washing, rinsing, drying)?: A Lot Help from another person to put on and taking off regular upper body clothing?: A Little Help from another person to put on and taking off regular lower body clothing?: A Lot 6 Click Score: 15    End of Session Equipment Utilized During Treatment: Rolling walker;Gait belt  OT Visit Diagnosis: Unsteadiness on feet (R26.81)   Activity Tolerance Patient limited by fatigue;Patient limited by pain   Patient Left in chair;with call bell/phone within reach;with chair alarm set   Nurse Communication      Functional Assessment Tool Used: AM-PAC 6 Clicks Daily Activity   Time: 8003-4917 OT Time Calculation (min): 29 min  Charges: OT G-codes **NOT FOR INPATIENT CLASS** Functional Assessment Tool Used: AM-PAC 6 Clicks Daily Activity OT General Charges $OT Visit: 1 Visit OT Treatments $Self Care/Home Management : 8-22 mins $Therapeutic Activity: 8-22 mins     Britt Bottom 12/23/2017, 1:41 PM

## 2017-12-23 NOTE — Clinical Social Work Placement (Signed)
   CLINICAL SOCIAL WORK PLACEMENT  NOTE  Date:  12/23/2017  Patient Details  Name: Lance Martin MRN: [id number] Date of Birth: 04/24/1933  Clinical Social Work is seeking post-discharge placement for this patient at the [place] level of care (*CSW will initial, date and re-position this form in  chart as items are completed):  Yes   Patient/family provided with [facility]'s list of facilities offering this level of care within the geographic area requested by the patient (or if unable, by the patient's family).  Yes   Patient/family informed of their freedom to choose among providers that offer the needed level of care, that participate in Medicare, Medicaid or managed care program needed by the patient, have an available bed and are willing to accept the patient.  Yes   Patient/family informed of [facility]'s ownership interest in [facility] and [facility], as well as of the fact that they are under no obligation to receive care at these facilities.  PASRR submitted to EDS on       PASRR number received on       Existing PASRR number confirmed on 12/22/17     FL2 transmitted to all facilities in geographic area requested by pt/family on 12/22/17     FL2 transmitted to all facilities within larger geographic area on       Patient informed that his/her managed care company has contracts with or will negotiate with certain facilities, including the following:  [facility]     Yes   Patient/family informed of bed offers received.  Patient chooses bed at [facility]     Physician recommends and patient chooses bed at      Patient to be transferred to [facility] on 12/23/17.  Patient to be transferred to facility by [facility]     Patient family notified on 12/23/17 of transfer.  Name of family member notified:  [name], daughter     PHYSICIAN       Additional Comment:     _______________________________________________ Estanislado Emms, LCSW 12/23/2017, 11:01 AM

## 2017-12-23 NOTE — Progress Notes (Signed)
Patient will discharge to [facility] Anticipated discharge date: 12/23/17  Family notified: Sankalp Ferrell, daughter Transportation by: Corey Harold  Nurse to call report to [phone].   CSW signing off.  Estanislado Emms, [place]  Clinical Social Worker

## 2017-12-23 NOTE — Progress Notes (Signed)
CSW spoke to patient's daughter, [name], via phone. She indicated she spoke to patient yesterday evening and their preferences are [facility], [facility], or [facility]. [facility] does not have a bed, but [facility] can offer bed today. Daughter agreeable to [facility] and will go to sign admissions paperwork at the facility at lunchtime today. CSW to follow and support with discharge.  Estanislado Emms, [place]

## 2017-12-23 NOTE — Discharge Summary (Signed)
[facility] Discharge Summary   Patient: Lance Martin EUM:[id number]   PCP: Crist Infante, MD DOB: 1933/07/25   Date of admission: 12/17/2017   Date of discharge:  12/23/2017    Discharge Diagnoses:  Active Problems:   Obstructive sleep apnea (adult) (pediatric)   Sleep apnea with use of continuous positive airway pressure (CPAP)   Abnormal LFTs (liver function tests)   Cholangitis   Sepsis ([place])   Atrial fibrillation with RVR (HCC)   Essential hypertension   Alcohol abuse   Elevated troponin   Debility   Dehydration   Pressure injury of skin   Abdominal pain, RUQ (right upper quadrant)   Acute acalculous cholecystitis   Admitted From: home Disposition:  SNF  Recommendations for Outpatient Follow-up:  1. Please follow up with General surgery, IR, gastroenterology, PCP as recommended    Contact information for follow-up providers    Jacqulynn Cadet, MD Follow up in 6 week(s).   Specialties:  Interventional Radiology, Radiology Why:  pt will hear from [place] for follow up 6 weeks--- call [phone] if questions Contact information: [address] [place] [address] [phone]        Irene Shipper, MD Follow up on 02/10/2018.   Specialty:  Gastroenterology Why:  2:15 PM follow up with Dr Henrene Pastor for liver issues.   Contact information: [address] [place] [address] [phone]        Greer Pickerel, MD. Call in 1 week(s).   Specialty:  General Surgery Why:  follow up in 4 weeks. ~ 01/22/18.  If his office does not contact you with appt, call to have appt arranged.   Contact information: [place] [address] [phone]        Crist Infante, MD. Schedule an appointment as soon as possible for a visit in 1 week(s).   Specialty:  Internal Medicine Contact information: [place] [address] [phone]            Contact information for after-discharge care    Destination    [facility] .   Service:  Skilled Nursing Contact information: [place] [phone]                 Diet recommendation: cardiac diet  Activity: The patient is advised to gradually reintroduce usual activities.  Discharge Condition: good  Code Status: DNR DNI  History of present illness: As per the H and P dictated on admission, "Lance Martin is a 84 y.o. male with medical history significant of prostate cancer, gout, hematuria,hTN, HLD    Presented with nausea and abdominal discomfortand jaundice he was supposed to have ultrasound Box for causes of liver failurebut was too weak to go Has been feeling weak for a past few days which right upper quadrant pain anorexia nausea patient states that he drinks alcohol He presented primary care office was found to have A. Fib with RVR heart rate 110  Family reports he has been drinking more than usual 2 shots a day since his wife passed away few months ago."  Hospital Course:  Summary of his active problems in the hospital is as following. 1.  Acute cholecystitis. Presented with abdominal pain, GI was consulted, MRCP was performed, CBD did not show any stone, there were also no gallstones. Gallbladder was thickened. GI currently has signed off recommending no further work-up from  their perspective. General surgery was consulted by GI for cholecystitis. Surgery felt that the patient would be at high risk and therefore recommended percutaneous cholecystostomy tube placement. IR consulted, underwent the perc chole tube placement on 12/19/2017 Pt has some pleuritic chest pain after the insertion of the tube.  Chest x-ray shows no evidence of acute abnormality. Culture from the drain ER growing multiple species, no anaerobes. Patient was on IV cefepime and Flagyl, will transition him to oral Keflex and Flagyl, total 7 days of Antibiotics from clinical  improvement.   2.  Elevated troponin. Cardiology was consulted. Preoperative evaluation suggest patient will be at moderate risk for surgery. Elevated troponin likely demand ischemia and A. fib. No further work-up recommended.  3.  Sinus tachycardia. Likely in the response of infection. Initially thought to be A. fib on admission although it appears that PAC burden rather than active A. fib.  Started on beta-blocker.  Monitor. Patient was started on anticoagulation. Discontinue heparin.  4. Essential hypertension. Blood pressure soft. Patient on 3 blood pressure medications at home, currently on hold. Rate control with beta-blocker initiated.  5.  New diagnosis of cirrhosis. As per GI likely combination of alcohol induced and [place]. Outpatient follow-up recommended by GI. CPC class C, MELD 24. Recommend alcohol cessation.  6.  Cough. Chest x-ray performed to verify the position of the cholecystostomy tube. Overlies right upper abdominal quadrant. Will initiate on incentive spirometry.  7.  Dysphagia. Speech therapy consulted, recommend MBS.   MBS shows no acute abnormality. Speech therapy recommends regular diet. GI has recommended esophagogram in the past.  Outpatient follow-up with GI.  8.  Constipation. On lactulose.  All other chronic medical condition were stable during the hospitalization.  Patient was seen by physical therapy, who recommended SNF, which was arranged by Education officer, museum and case Freight forwarder. On the day of the discharge the patient's vitals were stable, and no other acute medical condition were reported by patient. the patient was felt safe to be discharge at SNF with therapy.  Consultants: General surgery gastroenterology cardiology IR Procedures: IR Perc Cholecystostomy   DISCHARGE MEDICATION: Allergies as of 12/23/2017      Reactions   Allopurinol Hives, Other (See Comments)   Cause renal failure    Ampicillin Diarrhea, Nausea And Vomiting       Medication List    STOP taking these medications   amLODipine 2.5 MG tablet Commonly known as:  NORVASC   benazepril 40 MG tablet Commonly known as:  LOTENSIN   metoprolol succinate 25 MG 24 hr tablet Commonly known as:  TOPROL-XL     TAKE these medications   atorvastatin 40 MG tablet Commonly known as:  LIPITOR Take 40 mg by mouth daily.   calcium-vitamin D 500-200 MG-UNIT tablet Commonly known as:  OSCAL WITH D Take 1 tablet by mouth.   cephALEXin 500 MG capsule Commonly known as:  KEFLEX Take 1 capsule (500 mg total) by mouth every 12 (twelve) hours for 3 days.   cetirizine 10 MG tablet Commonly known as:  ZYRTEC Take 10 mg by mouth daily.   cholecalciferol 1000 units tablet Commonly known as:  VITAMIN D Take 1,000 Units by mouth daily.   CoQ-10 100 MG Caps Take 1 Can by mouth as needed.   Febuxostat 80 MG Tabs Take 80 mg by mouth daily.   FLAXSEED OIL PO Take 1,400 mg by mouth daily. Will stop prior to procedure   fluticasone 50 MCG/ACT nasal spray Commonly known as:  FLONASE  Place 2 sprays into the nose daily. ALLERGIES   lactulose 10 GM/15ML solution Commonly known as:  CHRONULAC Take 15 mLs (10 g total) by mouth daily. Start taking on:  12/24/2017   metoprolol tartrate 25 MG tablet Commonly known as:  LOPRESSOR Take 1 tablet (25 mg total) by mouth 2 (two) times daily.   metroNIDAZOLE 500 MG tablet Commonly known as:  FLAGYL Take 1 tablet (500 mg total) by mouth every 8 (eight) hours for 3 days.   multivitamins ther. w/minerals Tabs tablet Take 1 tablet by mouth daily.   omeprazole 40 MG capsule Commonly known as:  PRILOSEC Take 1 capsule (40 mg total) by mouth daily.   potassium chloride 20 MEQ/15ML (10%) Soln Take 15 mLs (20 mEq total) by mouth daily. Start taking on:  12/24/2017   traMADol 50 MG tablet Commonly known as:  ULTRAM Take 1 tablet (50 mg total) by mouth every 6 (six) hours as needed for up to 5 days for moderate pain.     TURMERIC PO Take 1,000 mg by mouth 2 (two) times daily.      Allergies  Allergen Reactions  . Allopurinol Hives and Other (See Comments)    Cause renal failure   . Ampicillin Diarrhea and Nausea And Vomiting   Discharge Instructions    Diet - low sodium heart healthy   Complete by:  As directed    Discharge instructions   Complete by:  As directed    It is important that you read following instructions as well as go over your medication list with RN to help you understand your care after this hospitalization.  Discharge Instructions: Please follow-up with PCP in one week  Please request your primary care physician to go over all Hospital Tests and Procedure/Radiological results at the follow up,  Please get all Hospital records sent to your PCP by signing hospital release before you go home.   Do not take more than prescribed Pain, Sleep and Anxiety Medications. You were cared for by a hospitalist during your hospital stay. If you have any questions about your discharge medications or the care you received while you were in the hospital after you are discharged, you can call the unit and ask to speak with the hospitalist on call if the hospitalist that took care of you is not available.  Once you are discharged, your primary care physician will handle any further medical issues. Please note that NO REFILLS for any discharge medications will be authorized once you are discharged, as it is imperative that you return to your primary care physician (or establish a relationship with a primary care physician if you do not have one) for your aftercare needs so that they can reassess your need for medications and monitor your lab values. You Must read complete instructions/literature along with all the possible adverse reactions/side effects for all the Medicines you take and that have been prescribed to you. Take any new Medicines after you have completely understood and accept all the  possible adverse reactions/side effects. Wear Seat belts while driving. If you have smoked or chewed Tobacco in the last 2 yrs please stop smoking and/or stop any Recreational drug use.   Increase activity slowly   Complete by:  As directed      Discharge Exam: Filed Weights   12/21/17 0600 12/22/17 0429 12/23/17 0548  Weight: 74.5 kg (164 lb 3.2 oz) 74.4 kg (164 lb 1.6 oz) 75.2 kg (165 lb 12.6 oz)   Vitals:  12/23/17 0548 12/23/17 0907  BP: 125/62 129/78  Pulse: 79 78  Resp: 18   Temp: 97.9 F (36.6 C)   SpO2: 96%    General: Appear in no distress, no Rash; Oral Mucosa moist. Cardiovascular: S1 and S2 Present, no Murmur, n JVD Respiratory: Bilateral Air entry present and Clear to Auscultation, no Crackles, no wheezes Abdomen: Bowel Sound present, Soft and no tenderness Extremities: no Pedal edema, no calf tenderness Neurology: Grossly no focal neuro deficit.  The results of significant diagnostics from this hospitalization (including imaging, microbiology, ancillary and laboratory) are listed below for reference.    Significant Diagnostic Studies: Dg Chest 2 View  Result Date: 12/21/2017 CLINICAL DATA:  Pleuritic chest pain for 3 days, history hypertension, prostate cancer, gout EXAM: CHEST - 2 VIEW COMPARISON:  12/20/2017 FINDINGS: Normal heart size, mediastinal contours, and pulmonary vascularity. Atherosclerotic calcifications at aortic arch. Elevation of RIGHT diaphragm. No definite acute infiltrate, pleural effusion or pneumothorax. Osseous structures unremarkable. Pigtail drainage catheter RIGHT upper quadrant. IMPRESSION: Elevation of RIGHT diaphragm, chronic. No acute abnormalities. Electronically Signed   By: Lavonia Dana M.D.   On: 12/21/2017 13:18   Dg Chest 2 View  Result Date: 12/17/2017 CLINICAL DATA:  Sepsis EXAM: CHEST - 2 VIEW COMPARISON:  01/16/2014 FINDINGS: Mild interstitial edema. Heart size and mediastinal contours are stable with minimal aortic  atherosclerosis. No pulmonary consolidation. No acute osseous abnormality. IMPRESSION: Mild interstitial edema. No acute pulmonary consolidation. Aortic atherosclerosis. Electronically Signed   By: Ashley Royalty M.D.   On: 12/17/2017 23:05   Mr Abdomen Mrcp Wo Contrast  Result Date: 12/18/2017 CLINICAL DATA:  Right upper quadrant pain and jaundice. Abnormal liver and gallbladder on ultrasound. EXAM: MRI ABDOMEN WITHOUT CONTRAST  (INCLUDING MRCP) TECHNIQUE: Multiplanar multisequence MR imaging of the abdomen was performed. Heavily T2-weighted images of the biliary and pancreatic ducts were obtained, and three-dimensional MRCP images were rendered by post processing. COMPARISON:  Ultrasound on 12/17/2017 FINDINGS: Lower chest: No acute findings. Hepatobiliary: Image degradation by motion artifact noted. Hepatic cirrhosis is demonstrated. No hepatic mass visualized on this unenhanced exam. The gallbladder is dilated and shows moderate diffuse wall thickening and pericholecystic edema. Sludge is also seen within the gallbladder lumen. These findings are greater than typically seen with cirrhosis, and acute cholecystitis cannot be excluded. There is no evidence of biliary ductal dilatation, with common bile duct measuring approximately 5 mm. Pancreas: No mass or inflammatory process visualized on this unenhanced exam. No evidence of pancreatic ductal dilatation. Spleen:  Within normal limits in size. Adrenals/Urinary tract: Tiny right renal cyst noted. No evidence of hydronephrosis. Stomach/Bowel: Visualized portion unremarkable. Vascular/Lymphatic: No pathologically enlarged lymph nodes identified. No evidence of abdominal aortic aneurysm. Other:  Mild ascites. Musculoskeletal:  No suspicious bone lesions identified. IMPRESSION: Hepatic cirrhosis and mild ascites. Dilated gallbladder with sludge, moderate diffuse wall thickening and pericholecystic edema. These findings are greater than typically seen with cirrhosis,  and acute cholecystitis cannot be excluded. If clinically warranted, nuclear medicine hepatobiliary scan could be performed for further evaluation. No evidence of biliary ductal dilatation. Electronically Signed   By: Earle Gell M.D.   On: 12/18/2017 07:32   Mr 3d Recon At Scanner  Result Date: 12/18/2017 CLINICAL DATA:  Right upper quadrant pain and jaundice. Abnormal liver and gallbladder on ultrasound. EXAM: MRI ABDOMEN WITHOUT CONTRAST  (INCLUDING MRCP) TECHNIQUE: Multiplanar multisequence MR imaging of the abdomen was performed. Heavily T2-weighted images of the biliary and pancreatic ducts were obtained, and three-dimensional MRCP images were  rendered by post processing. COMPARISON:  Ultrasound on 12/17/2017 FINDINGS: Lower chest: No acute findings. Hepatobiliary: Image degradation by motion artifact noted. Hepatic cirrhosis is demonstrated. No hepatic mass visualized on this unenhanced exam. The gallbladder is dilated and shows moderate diffuse wall thickening and pericholecystic edema. Sludge is also seen within the gallbladder lumen. These findings are greater than typically seen with cirrhosis, and acute cholecystitis cannot be excluded. There is no evidence of biliary ductal dilatation, with common bile duct measuring approximately 5 mm. Pancreas: No mass or inflammatory process visualized on this unenhanced exam. No evidence of pancreatic ductal dilatation. Spleen:  Within normal limits in size. Adrenals/Urinary tract: Tiny right renal cyst noted. No evidence of hydronephrosis. Stomach/Bowel: Visualized portion unremarkable. Vascular/Lymphatic: No pathologically enlarged lymph nodes identified. No evidence of abdominal aortic aneurysm. Other:  Mild ascites. Musculoskeletal:  No suspicious bone lesions identified. IMPRESSION: Hepatic cirrhosis and mild ascites. Dilated gallbladder with sludge, moderate diffuse wall thickening and pericholecystic edema. These findings are greater than typically seen  with cirrhosis, and acute cholecystitis cannot be excluded. If clinically warranted, nuclear medicine hepatobiliary scan could be performed for further evaluation. No evidence of biliary ductal dilatation. Electronically Signed   By: Earle Gell M.D.   On: 12/18/2017 07:32   Ir Perc Cholecystostomy  Result Date: 12/19/2017 INDICATION: 84 year old male with acute cholecystitis (sludge, no definite stones). He is currently a poor operative candidate in the setting of hepatic cirrhosis. He presents for percutaneous cholecystostomy tube placement. EXAM: CHOLECYSTOSTOMY MEDICATIONS: In patient currently receiving intravenous cefepime and Flagyl. No additional antibiotic prophylaxis administered. ANESTHESIA/SEDATION: Moderate (conscious) sedation was employed during this procedure. A total of Versed 1 mg and Fentanyl 50 mcg was administered intravenously. Moderate Sedation Time: 13 minutes. The patient's level of consciousness and vital signs were monitored continuously by radiology nursing throughout the procedure under my direct supervision. FLUOROSCOPY TIME:  Fluoroscopy Time: 1 minutes 0 seconds (21 mGy). COMPLICATIONS: None immediate. PROCEDURE: Informed written consent was obtained from the patient after a thorough discussion of the procedural risks, benefits and alternatives. All questions were addressed. Maximal Sterile Barrier Technique was utilized including caps, mask, sterile gowns, sterile gloves, sterile drape, hand hygiene and skin antiseptic. A timeout was performed prior to the initiation of the procedure. The right upper quadrant was interrogated with ultrasound. The right hemidiaphragm is elevated. The liver sits high in the right upper quadrant. The distended and inflamed gallbladder containing sludge was successfully identified. There is a small volume of perihepatic ascites. A suitable skin entry site was selected and marked. Local anesthesia was attained by infiltration with 1% lidocaine. A  small dermatotomy was made. Under real-time sonographic guidance, the gallbladder lumen was punctured with a 21 gauge Accustick needle along a short transhepatic course. The 0.018 wire was then coiled in the gallbladder lumen. The needle was exchanged over the wire for the Accustick sheath which was advanced into the gallbladder. A gentle hand injection of contrast material confirms the Accustick sheath is within the gallbladder lumen. There is extensive debris within the lumen and cobblestoning of the mucosa. A Bentson wire was then coiled in the gallbladder lumen. The Accustick sheath was removed. The percutaneous tract was dilated to 10 [place] and a Cook 10.2 [place] all-purpose drainage catheter was advanced over the wire and formed in the gallbladder lumen. Aspiration was performed yielding approximately 60 mL of thick, purulent bile. A sample was sent for Gram stain and culture. The cholecystostomy tube was gently flushed and then connected to gravity bag  drainage. The catheter was secured to the skin with 0 Prolene suture. The patient tolerated the procedure well. IMPRESSION: Successful placement of a percutaneous transhepatic cholecystostomy tube for the treatment of acute cholecystitis (sludge, no definite stones on imaging). PLAN: 1. Maintain tube to gravity bag drainage. 2. Given patient's underlying cirrhosis, he is unlikely to be common optimal surgical candidate in the future. 3. IR drain clinic in 4 weeks for cholangiogram through existing tube. If the cystic duct regains patency, the percutaneous drainage catheter could be removed after 6 weeks given that there is no definitive evidence of cholelithiasis. Signed, Criselda Peaches, MD [facility] Electronically Signed   By: Jacqulynn Cadet M.D.   On: 12/19/2017 16:13   Dg Chest Port 1 View  Result Date: 12/20/2017 CLINICAL DATA:  Cough.  History of hypertension. EXAM: PORTABLE CHEST 1  VIEW COMPARISON:  12/17/2017; 01/26/2014; 08/14/2011; ultrasound fluoroscopic guided cholecystostomy tube placement-12/19/2017 FINDINGS: Grossly unchanged cardiac silhouette and mediastinal contours with atherosclerotic plaque within thoracic aorta. The pulmonary vasculature remains indistinct with cephalization of flow. Grossly unchanged bibasilar opacities favored to represent atelectasis. No new focal airspace opacities. No pleural effusion or pneumothorax. No acute osseus abnormalities. Cholecystostomy tube overlies the right upper abdominal quadrant. IMPRESSION: Suspected mild pulmonary edema, though note, atypical infection could have a similar appearance. Clinical correlation is advised. Electronically Signed   By: Sandi Mariscal M.D.   On: 12/20/2017 14:38   Dg Swallowing Func-speech Pathology  Result Date: 12/22/2017 Objective Swallowing Evaluation: Type of Study: MBS-Modified Barium Swallow Study  Patient Details Name: DICK HARK MRN: [id number] Date of Birth: 1933-01-03 Today's Date: 12/22/2017 Time: SLP Start Time (ACUTE ONLY): 1030 -SLP Stop Time (ACUTE ONLY): 1100 SLP Time Calculation (min) (ACUTE ONLY): 30 min Past Medical History: Past Medical History: Diagnosis Date . Arthritis  . AV block 05/13 ecg  1st degree . Bleeding   bladder,radiation cystitis-s/p cystoscopy, clot evacuation, fulgeration of bleeders . Cancer ([place])   prostate, hx of bsal cell skin cancer  . Cataract  . Gout  . Hematuria  . Hemorrhoid  . History of basal cell carcinoma excision 08-29-2011  left frontal scalp . History of gout PER PT -  STABLE . History of prostate cancer 2004   -  RX EXTERNAL RADIATION . Hx of subdural hematoma 08-14-2011    s/p total left hip  12-92012---  resolved w/ no residual . Hyperlipidemia  . Hypertension  . IFG (impaired fasting glucose)  . Lab findings of hypoxemia   abnormal ONO , dr perini. 05-26-2012 . Microalbuminuria 2011 . OA (osteoarthritis) of hip  . Obstructive apnea   AHI of 35 on 01-14-13 ,  titrated to 6 cm H20 . Pneumonia   hx of 2015  . Shoulder impingement syndrome   right . Sleep apnea   cpap nightly . Sleep apnea with use of continuous positive airway pressure (CPAP) 03/19/2013 Past Surgical History: Past Surgical History: Procedure Laterality Date . CARDIAC CATHETERIZATION    pt denies . COLONOSCOPY   . CYSTOSCOPY WITH BIOPSY  07/03/2012  Procedure: CYSTOSCOPY WITH BIOPSY;  Surgeon: Franchot Gallo, MD;  Location[facility];  Service: Urology;  Laterality: N/A;   . CYSTOSCOPY WITH FULGERATION N/A 12/02/2016  Procedure: CYSTOSCOPY WITH FULGERATION OF BLEEDERS AND TRANSURETRAL INCISION OF PROSTATE;  Surgeon: Franchot Gallo, MD;  Location: [facility];  Service: Urology;  Laterality: N/A; . EYE SURGERY  2009  bilateral cataract surgery  . IR PERC CHOLECYSTOSTOMY  12/19/2017 .  JOINT REPLACEMENT   . MOHS SURGERY   . PROSTATE SURGERY   . RIGHT ELBOW SURGERY  MAR 2013 . right elbow surgery  2009  ELBOW AND WRIST SURGERY . TONSILLECTOMY    AGE 39 . TOTAL HIP ARTHROPLASTY  07/31/2011  Procedure: TOTAL HIP ARTHROPLASTY;  Surgeon: Dione Plover Aluisio;  Location: [facility];  Service: Orthopedics;  Laterality: Left; . TOTAL HIP ARTHROPLASTY   . TRANSURETHRAL RESECTION OF PROSTATE  07/25/2012  Procedure: TRANSURETHRAL RESECTION OF THE PROSTATE WITH GYRUS INSTRUMENTS;  Surgeon: Alexis Frock, MD;  Location: [facility];  Service: Urology;  Laterality: N/A;  CYSTOSCOPY;CLOT EVACUATION WITH FULGERATION . ULNAR NERVE REPAIR  04/13 HPI: Pt. with PMH of HTN, HLD, gout, prostate cancer, hemorrhagic cystitis S/P fulguration, GERD; admitted on 12/17/2017, presented with complaint of abdominal pain, was found to have acute cholecystitis, s/p percutaneous cholecystostomy 12/19/17; on clears per surgery to be advanced as tolerated. Per GI pt with recent trouble swallowing with increased regurgitation prior to admission. GI recommending dysphagia w/u with esophagram, likely next week.  Subjective: Awake in bed Assessment /  Plan / Recommendation CHL IP CLINICAL IMPRESSIONS 12/22/2017 Clinical Impression Pt demonstrates a mild oropharyngeal dysphagia with structural difference given  appearance of bony protrusions on anterior cervical spine from C2-C6 at least. Pt observed to independently compensate for reduced UES opening by sustaining laryngeal elevation and closure over 2-3 instances of propulsion/hyoid excursion/UES opening to fully transit bolus. Minimal thin and solid residue clings to posterior pharyngeal wall and around epiglottis right bove where it contacts with the beginning of most significant protrusion of pharyngeal wall. Pt clears most residue with two swallows and a liquid wash. Pt may resume a regular diet and thin liquids with use of strategies with f/u from SLP. Did not advance solids given GI issues. MD may advance to regular thin if pt ready  SLP Visit Diagnosis Dysphagia, unspecified (R13.10) Attention and concentration deficit following -- Frontal lobe and executive function deficit following -- Impact on safety and function Mild aspiration risk   [facility] IP TREATMENT RECOMMENDATION 12/22/2017 Treatment Recommendations Therapy as outlined in treatment plan below   Prognosis 12/22/2017 Prognosis for Safe Diet Advancement Good Barriers to Reach Goals -- Barriers/Prognosis Comment -- [facility] IP DIET RECOMMENDATION 12/22/2017 [facility] Diet Recommendations Regular solids;Thin liquid Liquid Administration via Cup;Straw Medication Administration Whole meds with puree Compensations Slow rate;Small sips/bites;Follow solids with liquid;Multiple dry swallows after each bite/sip Postural Changes --   [facility] IP OTHER RECOMMENDATIONS 12/22/2017 Recommended Consults -- Oral Care Recommendations Oral care BID Other Recommendations --   [facility] IP FOLLOW UP RECOMMENDATIONS 12/22/2017 Follow up Recommendations Skilled Nursing facility   [facility] IP FREQUENCY AND DURATION 12/22/2017 Speech Therapy Frequency (ACUTE ONLY) min 2x/week Treatment Duration 2 weeks       No flowsheet data found. No flowsheet data found. No flowsheet data found. No flowsheet data found. Herbie Baltimore, [place] CCC-SLP [phone] [name] 12/22/2017, 2:11 PM              US Abdomen Limited Ruq  Result Date: 12/17/2017 CLINICAL DATA:  Right upper quadrant abdominal pain and jaundice. EXAM: ULTRASOUND ABDOMEN LIMITED RIGHT UPPER QUADRANT COMPARISON:  None. FINDINGS: Gallbladder: Tumefactive biliary sludge is identified within the gallbladder. The single wall thickness of the gallbladder is 4.2 mm, normal less than 3 mm. No pericholecystic fluid. No sonographic Murphy sign noted by sonographer. Common bile duct: Diameter: 5 mm Liver: No focal lesion identified given limitations due to the patient's inability to adequately position for  better images. Within normal limits in parenchymal echogenicity. Portal vein is patent on color Doppler imaging though indeterminate in direction of blood flow given mixed color signal noted within. Nodular liver surface compatible with cirrhosis. Adjacent small volume of ascites. IMPRESSION: 1. Thick-walled gallbladder with biliary sludge noted within. 2. Cirrhotic appearing liver with adjacent small volume of perihepatic ascites. Mixed color Doppler signal within the imaged portal venous system may represent early changes of portal hypertension though the portal vein is patent. Electronically Signed   By: Ashley Royalty M.D.   On: 12/17/2017 19:55    Microbiology: Recent Results (from the past 240 hour(s))  Culture, blood (x 2)     Status: None (Preliminary result)   Collection Time: 12/17/17 11:06 PM  Result Value Ref Range Status   Specimen Description BLOOD RIGHT ANTECUBITAL  Final   Special Requests   Final    BOTTLES DRAWN AEROBIC AND ANAEROBIC Blood Culture adequate volume   Culture   Final    NO GROWTH 4 DAYS Performed at [facility], [address] [place], [place] [address]    Report Status PENDING  Incomplete  Culture, blood (x 2)      Status: None (Preliminary result)   Collection Time: 12/18/17  6:34 AM  Result Value Ref Range Status   Specimen Description BLOOD LEFT HAND  Final   Special Requests   Final    BOTTLES DRAWN AEROBIC AND ANAEROBIC Blood Culture adequate volume   Culture   Final    NO GROWTH 4 DAYS Performed at [facility], [place] [address] [place], [place] [address]    Report Status PENDING  Incomplete  MRSA PCR Screening     Status: None   Collection Time: 12/19/17  9:20 AM  Result Value Ref Range Status   MRSA by PCR NEGATIVE NEGATIVE Final    Comment:        The GeneXpert MRSA Assay (FDA approved for NASAL specimens only), is one component of a comprehensive MRSA colonization surveillance program. It is not intended to diagnose MRSA infection nor to guide or monitor treatment for MRSA infections. Performed at [facility], [place] [address], [place], [place] [address]   Aerobic/Anaerobic Culture (surgical/deep wound)     Status: Abnormal (Preliminary result)   Collection Time: 12/19/17  2:58 PM  Result Value Ref Range Status   Specimen Description BILE  Final   Special Requests NONE  Final   Gram Stain   Final    ABUNDANT WBC PRESENT, PREDOMINANTLY PMN FEW GRAM POSITIVE COCCI FEW GRAM VARIABLE ROD Performed at [facility], [place] [address], [place], [place] [address]    Culture (A)  Final    MULTIPLE ORGANISMS PRESENT, NONE PREDOMINANT NO ANAEROBES ISOLATED; CULTURE IN PROGRESS FOR 5 DAYS    Report Status PENDING  Incomplete     Labs: CBC: Recent Labs  Lab 12/17/17 1521  12/19/17 0308 12/20/17 0542 12/21/17 0530 12/22/17 0425 12/23/17 0439  WBC 18.2*   < > 17.9* 11.9* 9.2 10.2 11.6*  NEUTROABS 14.0  --  14.3*  --   --   --   --   HGB 13.6   < > 11.4* 11.8* 11.6* 11.4* 12.0*  HCT 37.7*   < > 31.7* 33.5* 33.4* 33.2* 34.6*  MCV 97.9   < > 96.9 98.5 97.9 98.8 100.3*  PLT 176   < > 161 177 219 237 253   < > = values in this interval  not displayed.   Basic  Metabolic Panel: Recent Labs  Lab 12/18/17 0651 12/19/17 0308 12/20/17 0542 12/21/17 0530 12/22/17 0425 12/23/17 0439  NA 135 136 139 142 141 142  K 3.2* 3.0* 4.0 4.1 4.0 4.0  CL 100* 105 110 113* 114* 114*  CO2 22 22 20* 21* 19* 21*  GLUCOSE 77 78 87 83 124* 97  BUN 43* 37* 31* 23* 18 16  CREATININE 1.78* 1.58* 1.31* 1.18 1.18 1.14  CALCIUM 8.2* 7.8* 8.0* 8.4* 8.3* 8.5*  MG 1.8 1.8 1.9  --   --   --   PHOS 3.8  --   --   --   --   --    Liver Function Tests: Recent Labs  Lab 12/17/17 1823 12/18/17 0651 12/19/17 0308  AST 105* 86* 80*  ALT 64* 56 49  ALKPHOS 196* 167* 183*  BILITOT 5.4* 5.4* 4.5*  PROT 5.7* 5.2* 4.9*  ALBUMIN 2.6* 2.4* 2.1*   Recent Labs  Lab 12/17/17 1823  LIPASE 121*   Recent Labs  Lab 12/19/17 0308  AMMONIA 43*   Cardiac Enzymes: Recent Labs  Lab 12/17/17 2123 12/18/17 0651 12/18/17 0935  TROPONINI 0.10* 0.03* <0.03   BNP (last 3 results) Recent Labs    12/17/17 1545  BNP 330.2*   CBG: No results for input(s): GLUCAP in the last 168 hours. Time spent: 35 minutes  Signed:  Berle Mull  [facility]  12/23/2017  , 9:20 AM

## 2017-12-24 ENCOUNTER — Other Ambulatory Visit: Payer: Self-pay | Admitting: Internal Medicine

## 2017-12-24 ENCOUNTER — Non-Acute Institutional Stay (SKILLED_NURSING_FACILITY): Payer: Medicare Other | Admitting: Internal Medicine

## 2017-12-24 DIAGNOSIS — E785 Hyperlipidemia, unspecified: Secondary | ICD-10-CM | POA: Diagnosis not present

## 2017-12-24 DIAGNOSIS — K810 Acute cholecystitis: Secondary | ICD-10-CM | POA: Diagnosis not present

## 2017-12-24 DIAGNOSIS — M545 Low back pain: Principal | ICD-10-CM

## 2017-12-24 DIAGNOSIS — R000 Tachycardia, unspecified: Secondary | ICD-10-CM

## 2017-12-24 DIAGNOSIS — K219 Gastro-esophageal reflux disease without esophagitis: Secondary | ICD-10-CM

## 2017-12-24 DIAGNOSIS — K7030 Alcoholic cirrhosis of liver without ascites: Secondary | ICD-10-CM | POA: Diagnosis not present

## 2017-12-24 DIAGNOSIS — I10 Essential (primary) hypertension: Secondary | ICD-10-CM | POA: Diagnosis not present

## 2017-12-24 DIAGNOSIS — M1A00X0 Idiopathic chronic gout, unspecified site, without tophus (tophi): Secondary | ICD-10-CM

## 2017-12-24 DIAGNOSIS — G8929 Other chronic pain: Secondary | ICD-10-CM

## 2017-12-24 LAB — AEROBIC/ANAEROBIC CULTURE W GRAM STAIN (SURGICAL/DEEP WOUND)

## 2017-12-24 LAB — AEROBIC/ANAEROBIC CULTURE (SURGICAL/DEEP WOUND)

## 2017-12-25 LAB — HEPATITIS PANEL, ACUTE
HEP B S AG: NEGATIVE
Hep A IgM: NEGATIVE
Hep B C IgM: NEGATIVE

## 2017-12-26 ENCOUNTER — Inpatient Hospital Stay: Admission: RE | Admit: 2017-12-26 | Payer: Medicare Other | Source: Ambulatory Visit

## 2017-12-26 ENCOUNTER — Other Ambulatory Visit: Payer: Self-pay | Admitting: General Surgery

## 2017-12-26 DIAGNOSIS — K810 Acute cholecystitis: Secondary | ICD-10-CM

## 2017-12-27 ENCOUNTER — Encounter: Payer: Self-pay | Admitting: Internal Medicine

## 2017-12-27 DIAGNOSIS — E785 Hyperlipidemia, unspecified: Secondary | ICD-10-CM | POA: Insufficient documentation

## 2017-12-27 DIAGNOSIS — M109 Gout, unspecified: Secondary | ICD-10-CM | POA: Insufficient documentation

## 2017-12-27 DIAGNOSIS — R000 Tachycardia, unspecified: Secondary | ICD-10-CM | POA: Insufficient documentation

## 2017-12-27 DIAGNOSIS — K7030 Alcoholic cirrhosis of liver without ascites: Secondary | ICD-10-CM | POA: Insufficient documentation

## 2017-12-27 DIAGNOSIS — K219 Gastro-esophageal reflux disease without esophagitis: Secondary | ICD-10-CM | POA: Insufficient documentation

## 2017-12-27 LAB — CBC AND DIFFERENTIAL
HEMATOCRIT: 37 — AB (ref 41–53)
Hemoglobin: 12.6 — AB (ref 13.5–17.5)
NEUTROS ABS: 7
Platelets: 240 (ref 150–399)
WBC: 10.3

## 2017-12-27 NOTE — Progress Notes (Signed)
: Provider:  Hennie Duos MD Location:  [place] [facility] [place] [facility]   Place of Service:  SNF (31)Skilled nursing facility  PCP: Crist Infante, MD Patient Care Team: Crist Infante, MD as PCP - General (Internal Medicine)  Extended Emergency Contact Information Primary Emergency Contact: Marton Redwood [place] Phone: [phone] Relation: Daughter Secondary Emergency Contact: Albertine Grates [place] Phone: [phone] Relation: Grandaughter     Allergies: Allopurinol and Ampicillin  Chief Complaint  Patient presents with  . New Admit To SNF    HPI: Patient is 84 y.o. male with prostate cancer, gout, hematuria, hypertension, and hyperlipidemia who presented to [facility] ED with nausea and abdominal discomfort and jaundice.  He was supposed to have had an ultrasound done the day of presentation for causes of liver failure but was too weak to go to test.  Patient had been feeling weak for the past few days with right upper quadrant pain, anorexia, nausea,.  Patient admits that he drinks alcohol.  He presented to primary care office and was found to have atrial fib with RVR.  Family reports patient had been drinking more than usual since his wife passed away a few months ago.  Patient's white count was found to be 18.2, creatinine 1.98 and alkaline phosphatase 196 with a total bili of 5.4.  Patient was admitted to [facility] from 5/8-14 where he was found to have acute cholecystitis.  General surgery was consulted and he felt he was a high risk for operation and recommended percutaneous cholecystostomy tube placement which she had.  Culture from the drain growing multiple species no anaerobes patient was on IV cefepime and Flagyl then transition to oral Keflex and Flagyl.  Patient's atrial fibrillation was found to be sinus tachycardia.  Hospital course was also complicated by new diagnosis of alcoholic cirrhosis to be followed up by GI as an  outpatient.  Patient is admitted to skilled nursing facility for OT/PT and percutaneous drain care.  While at skilled nursing facility patient will be followed for gout treated with Uloric, hyperlipidemia treated with Lipitor and GERD treated with Prilosec.  Past Medical History:  Diagnosis Date  . Arthritis   . AV block 05/13 ecg   1st degree  . Bleeding    bladder,radiation cystitis-s/p cystoscopy, clot evacuation, fulgeration of bleeders  . Cancer ([place])    prostate, hx of bsal cell skin cancer   . Cataract   . Gout   . Hematuria   . Hemorrhoid   . History of basal cell carcinoma excision 08-29-2011  left frontal scalp  . History of gout PER PT -  STABLE  . History of prostate cancer 2004   -  RX EXTERNAL RADIATION  . Hx of subdural hematoma 08-14-2011    s/p total left hip  12-92012---  resolved w/ no residual  . Hyperlipidemia   . Hypertension   . IFG (impaired fasting glucose)   . Lab findings of hypoxemia    abnormal ONO , dr perini. 05-26-2012  . Microalbuminuria 2011  . OA (osteoarthritis) of hip   . Obstructive apnea    AHI of 35 on 01-14-13 , titrated to 6 cm H20  . Pneumonia    hx of 2015   . Shoulder impingement syndrome    right  . Sleep apnea    cpap nightly  . Sleep apnea with use of continuous positive airway pressure (CPAP) 03/19/2013    Past Surgical History:  Procedure Laterality  Date  . CARDIAC CATHETERIZATION     pt denies  . COLONOSCOPY    . CYSTOSCOPY WITH BIOPSY  07/03/2012   Procedure: CYSTOSCOPY WITH BIOPSY;  Surgeon: Franchot Gallo, MD;  Location: [facility];  Service: Urology;  Laterality: N/A;      . CYSTOSCOPY WITH FULGERATION N/A 12/02/2016   Procedure: CYSTOSCOPY WITH FULGERATION OF BLEEDERS AND TRANSURETRAL INCISION OF PROSTATE;  Surgeon: Franchot Gallo, MD;  Location: [facility];  Service: Urology;  Laterality: N/A;  . EYE SURGERY  2009   bilateral cataract surgery   . IR PERC CHOLECYSTOSTOMY  12/19/2017  . JOINT  REPLACEMENT    . MOHS SURGERY    . PROSTATE SURGERY    . RIGHT ELBOW SURGERY  MAR 2013  . right elbow surgery  2009   ELBOW AND WRIST SURGERY  . TONSILLECTOMY     AGE 43  . TOTAL HIP ARTHROPLASTY  07/31/2011   Procedure: TOTAL HIP ARTHROPLASTY;  Surgeon: Dione Plover Aluisio;  Location: [facility];  Service: Orthopedics;  Laterality: Left;  . TOTAL HIP ARTHROPLASTY    . TRANSURETHRAL RESECTION OF PROSTATE  07/25/2012   Procedure: TRANSURETHRAL RESECTION OF THE PROSTATE WITH GYRUS INSTRUMENTS;  Surgeon: Alexis Frock, MD;  Location: [facility];  Service: Urology;  Laterality: N/A;  CYSTOSCOPY;CLOT EVACUATION WITH FULGERATION  . ULNAR NERVE REPAIR  04/13    Allergies as of 12/24/2017      Reactions   Allopurinol Hives, Other (See Comments)   Cause renal failure    Ampicillin Diarrhea, Nausea And Vomiting      Medication List        Accurate as of 12/24/17 11:59 PM. Always use your most recent med list.          atorvastatin 40 MG tablet Commonly known as:  LIPITOR Take 40 mg by mouth daily.   calcium-vitamin D 500-200 MG-UNIT tablet Commonly known as:  OSCAL WITH D Take 1 tablet by mouth.   cephALEXin 500 MG capsule Commonly known as:  KEFLEX Take 1 capsule (500 mg total) by mouth every 12 (twelve) hours for 3 days.   cetirizine 10 MG tablet Commonly known as:  ZYRTEC Take 10 mg by mouth daily.   cholecalciferol 1000 units tablet Commonly known as:  VITAMIN D Take 1,000 Units by mouth daily.   CoQ-10 100 MG Caps Take 1 Can by mouth as needed.   Febuxostat 80 MG Tabs Take 80 mg by mouth daily.   FLAXSEED OIL PO Take 1,400 mg by mouth daily. Will stop prior to procedure   fluticasone 50 MCG/ACT nasal spray Commonly known as:  FLONASE Place 2 sprays into the nose daily. ALLERGIES   lactulose 10 GM/15ML solution Commonly known as:  CHRONULAC Take 15 mLs (10 g total) by mouth daily.   metoprolol tartrate 25 MG tablet Commonly known as:  LOPRESSOR Take 1 tablet (25 mg  total) by mouth 2 (two) times daily.   metroNIDAZOLE 500 MG tablet Commonly known as:  FLAGYL Take 1 tablet (500 mg total) by mouth every 8 (eight) hours for 3 days.   multivitamins ther. w/minerals Tabs tablet Take 1 tablet by mouth daily.   omeprazole 40 MG capsule Commonly known as:  PRILOSEC Take 1 capsule (40 mg total) by mouth daily.   potassium chloride 20 MEQ/15ML (10%) Soln Take 15 mLs (20 mEq total) by mouth daily.   traMADol 50 MG tablet Commonly known as:  ULTRAM Take 1 tablet (50 mg total) by mouth every  6 (six) hours as needed for up to 5 days for moderate pain.   TURMERIC PO Take 1,000 mg by mouth 2 (two) times daily.       No orders of the defined types were placed in this encounter.    There is no immunization history on file for this patient.  Social History   Tobacco Use  . Smoking status: Never Smoker  . Smokeless tobacco: Never Used  Substance Use Topics  . Alcohol use: Yes    Alcohol/week: 15.0 oz    Types: 4 Glasses of wine, 21 Shots of liquor per week    Comment: 4 drinks per day , all in whiskey category, 1/2 glass of wine at dinner     Family history is   Family History  Problem Relation Age of Onset  . Stroke Father   . Cancer Sister        Breast cancer  . Colon cancer Neg Hx   . Stomach cancer Neg Hx   . Esophageal cancer Neg Hx       Review of Systems  DATA OBTAINED: from patient, nurse GENERAL:  no fevers, fatigue, appetite changes SKIN: No itching, or rash EYES: No eye pain, redness, discharge EARS: No earache, tinnitus, change in hearing NOSE: No congestion, drainage or bleeding  MOUTH/THROAT: No mouth or tooth pain, No sore throat RESPIRATORY: No cough, wheezing, SOB CARDIAC: No chest pain, palpitations, lower extremity edema  GI: No abdominal pain, No N/V/D or constipation, No heartburn or reflux  GU: No dysuria, frequency or urgency, or incontinence  MUSCULOSKELETAL: No unrelieved bone/joint pain NEUROLOGIC: No  headache, dizziness or focal weakness PSYCHIATRIC: No c/o anxiety or sadness   Vitals:   12/27/17 1920  BP: 130/78  Pulse: 82  Resp: 18  Temp: (!) 97 F (36.1 C)  SpO2: 97%    SpO2 Readings from Last 1 Encounters:  12/27/17 97%   Body mass index is 25.21 kg/m.     Physical Exam  GENERAL APPEARANCE: Alert, conversant,  No acute distress.  SKIN: No diaphoresis rash HEAD: Normocephalic, atraumatic  EYES: Conjunctiva/lids clear. Pupils round, reactive. EOMs intact.  EARS: External exam WNL, canals clear. Hearing grossly normal.  NOSE: No deformity or discharge.  MOUTH/THROAT: Lips w/o lesions  RESPIRATORY: Breathing is even, unlabored. Lung sounds are clear   CARDIOVASCULAR: Heart RRR no murmurs, rubs or gallops. No peripheral edema.   GASTROINTESTINAL: Abdomen is soft, non-tender, not distended w/ normal bowel sounds; cutaneous drain right upper quadrant draining brown-green fluid, clearish. GENITOURINARY: Bladder non tender, not distended  MUSCULOSKELETAL: No abnormal joints or musculature NEUROLOGIC:  Cranial nerves 2-12 grossly intact. Moves all extremities  PSYCHIATRIC: Mood and affect appropriate to situation, no behavioral issues  Patient Active Problem List   Diagnosis Date Noted  . Abdominal pain, RUQ (right upper quadrant)   . Acute acalculous cholecystitis   . Pressure injury of skin 12/18/2017  . Abnormal LFTs (liver function tests) 12/17/2017  . Cholangitis 12/17/2017  . Sepsis ([place]) 12/17/2017  . Atrial fibrillation with RVR ([place]) 12/17/2017  . Essential hypertension 12/17/2017  . Alcohol abuse 12/17/2017  . Elevated troponin 12/17/2017  . Debility 12/17/2017  . Dehydration 12/17/2017  . Rectal bleeding 09/04/2017  . Nausea without vomiting 09/04/2017  . Sleep apnea with use of continuous positive airway pressure (CPAP) 03/19/2013  . Obstructive sleep apnea (adult) (pediatric) 02/05/2013  . Obstructive apnea   . Hypersomnia, persistent 01/01/2013  .  Lab findings of hypoxemia  Labs reviewed: Basic Metabolic Panel:    Component Value Date/Time   NA 142 12/23/2017 0439   K 4.0 12/23/2017 0439   CL 114 (H) 12/23/2017 0439   CO2 21 (L) 12/23/2017 0439   GLUCOSE 97 12/23/2017 0439   BUN 16 12/23/2017 0439   CREATININE 1.14 12/23/2017 0439   CALCIUM 8.5 (L) 12/23/2017 0439   PROT 4.9 (L) 12/19/2017 0308   ALBUMIN 2.1 (L) 12/19/2017 0308   AST 80 (H) 12/19/2017 0308   ALT 49 12/19/2017 0308   ALKPHOS 183 (H) 12/19/2017 0308   BILITOT 4.5 (H) 12/19/2017 0308   GFRNONAA 58 (L) 12/23/2017 0439   GFRAA >60 12/23/2017 0439    Recent Labs    12/18/17 0651 12/19/17 0308 12/20/17 0542 12/21/17 0530 12/22/17 0425 12/23/17 0439  NA 135 136 139 142 141 142  K 3.2* 3.0* 4.0 4.1 4.0 4.0  CL 100* 105 110 113* 114* 114*  CO2 22 22 20* 21* 19* 21*  GLUCOSE 77 78 87 83 124* 97  BUN 43* 37* 31* 23* 18 16  CREATININE 1.78* 1.58* 1.31* 1.18 1.18 1.14  CALCIUM 8.2* 7.8* 8.0* 8.4* 8.3* 8.5*  MG 1.8 1.8 1.9  --   --   --   PHOS 3.8  --   --   --   --   --    Liver Function Tests: Recent Labs    12/17/17 1823 12/18/17 0651 12/19/17 0308  AST 105* 86* 80*  ALT 64* 56 49  ALKPHOS 196* 167* 183*  BILITOT 5.4* 5.4* 4.5*  PROT 5.7* 5.2* 4.9*  ALBUMIN 2.6* 2.4* 2.1*   Recent Labs    12/17/17 1823  LIPASE 121*   Recent Labs    12/19/17 0308  AMMONIA 43*   CBC: Recent Labs    12/17/17 1521  12/19/17 0308  12/21/17 0530 12/22/17 0425 12/23/17 0439  WBC 18.2*   < > 17.9*   < > 9.2 10.2 11.6*  NEUTROABS 14.0  --  14.3*  --   --   --   --   HGB 13.6   < > 11.4*   < > 11.6* 11.4* 12.0*  HCT 37.7*   < > 31.7*   < > 33.4* 33.2* 34.6*  MCV 97.9   < > 96.9   < > 97.9 98.8 100.3*  PLT 176   < > 161   < > 219 237 253   < > = values in this interval not displayed.   Lipid No results for input(s): CHOL, HDL, LDLCALC, TRIG in the last 8760 hours.  Cardiac Enzymes: Recent Labs    12/17/17 2123 12/18/17 0651  12/18/17 0935  TROPONINI 0.10* 0.03* <0.03   BNP: Recent Labs    12/17/17 1545  BNP 330.2*   No results found for: MICROALBUR No results found for: HGBA1C Lab Results  Component Value Date   TSH 1.069 12/18/2017   No results found for: VITAMINB12 No results found for: FOLATE No results found for: IRON, TIBC, FERRITIN  Imaging and Procedures obtained prior to SNF admission: Dg Chest 2 View  Result Date: 12/17/2017 CLINICAL DATA:  Sepsis EXAM: CHEST - 2 VIEW COMPARISON:  01/16/2014 FINDINGS: Mild interstitial edema. Heart size and mediastinal contours are stable with minimal aortic atherosclerosis. No pulmonary consolidation. No acute osseous abnormality. IMPRESSION: Mild interstitial edema. No acute pulmonary consolidation. Aortic atherosclerosis. Electronically Signed   By: Ashley Royalty M.D.   On: 12/17/2017 23:05   Mr Abdomen Mrcp Wo Contrast  Result Date: 12/18/2017 CLINICAL DATA:  Right upper quadrant pain and jaundice. Abnormal liver and gallbladder on ultrasound. EXAM: MRI ABDOMEN WITHOUT CONTRAST  (INCLUDING MRCP) TECHNIQUE: Multiplanar multisequence MR imaging of the abdomen was performed. Heavily T2-weighted images of the biliary and pancreatic ducts were obtained, and three-dimensional MRCP images were rendered by post processing. COMPARISON:  Ultrasound on 12/17/2017 FINDINGS: Lower chest: No acute findings. Hepatobiliary: Image degradation by motion artifact noted. Hepatic cirrhosis is demonstrated. No hepatic mass visualized on this unenhanced exam. The gallbladder is dilated and shows moderate diffuse wall thickening and pericholecystic edema. Sludge is also seen within the gallbladder lumen. These findings are greater than typically seen with cirrhosis, and acute cholecystitis cannot be excluded. There is no evidence of biliary ductal dilatation, with common bile duct measuring approximately 5 mm. Pancreas: No mass or inflammatory process visualized on this unenhanced exam. No  evidence of pancreatic ductal dilatation. Spleen:  Within normal limits in size. Adrenals/Urinary tract: Tiny right renal cyst noted. No evidence of hydronephrosis. Stomach/Bowel: Visualized portion unremarkable. Vascular/Lymphatic: No pathologically enlarged lymph nodes identified. No evidence of abdominal aortic aneurysm. Other:  Mild ascites. Musculoskeletal:  No suspicious bone lesions identified. IMPRESSION: Hepatic cirrhosis and mild ascites. Dilated gallbladder with sludge, moderate diffuse wall thickening and pericholecystic edema. These findings are greater than typically seen with cirrhosis, and acute cholecystitis cannot be excluded. If clinically warranted, nuclear medicine hepatobiliary scan could be performed for further evaluation. No evidence of biliary ductal dilatation. Electronically Signed   By: Earle Gell M.D.   On: 12/18/2017 07:32   Mr 3d Recon At Scanner  Result Date: 12/18/2017 CLINICAL DATA:  Right upper quadrant pain and jaundice. Abnormal liver and gallbladder on ultrasound. EXAM: MRI ABDOMEN WITHOUT CONTRAST  (INCLUDING MRCP) TECHNIQUE: Multiplanar multisequence MR imaging of the abdomen was performed. Heavily T2-weighted images of the biliary and pancreatic ducts were obtained, and three-dimensional MRCP images were rendered by post processing. COMPARISON:  Ultrasound on 12/17/2017 FINDINGS: Lower chest: No acute findings. Hepatobiliary: Image degradation by motion artifact noted. Hepatic cirrhosis is demonstrated. No hepatic mass visualized on this unenhanced exam. The gallbladder is dilated and shows moderate diffuse wall thickening and pericholecystic edema. Sludge is also seen within the gallbladder lumen. These findings are greater than typically seen with cirrhosis, and acute cholecystitis cannot be excluded. There is no evidence of biliary ductal dilatation, with common bile duct measuring approximately 5 mm. Pancreas: No mass or inflammatory process visualized on this  unenhanced exam. No evidence of pancreatic ductal dilatation. Spleen:  Within normal limits in size. Adrenals/Urinary tract: Tiny right renal cyst noted. No evidence of hydronephrosis. Stomach/Bowel: Visualized portion unremarkable. Vascular/Lymphatic: No pathologically enlarged lymph nodes identified. No evidence of abdominal aortic aneurysm. Other:  Mild ascites. Musculoskeletal:  No suspicious bone lesions identified. IMPRESSION: Hepatic cirrhosis and mild ascites. Dilated gallbladder with sludge, moderate diffuse wall thickening and pericholecystic edema. These findings are greater than typically seen with cirrhosis, and acute cholecystitis cannot be excluded. If clinically warranted, nuclear medicine hepatobiliary scan could be performed for further evaluation. No evidence of biliary ductal dilatation. Electronically Signed   By: Earle Gell M.D.   On: 12/18/2017 07:32   US Abdomen Limited Ruq  Result Date: 12/17/2017 CLINICAL DATA:  Right upper quadrant abdominal pain and jaundice. EXAM: ULTRASOUND ABDOMEN LIMITED RIGHT UPPER QUADRANT COMPARISON:  None. FINDINGS: Gallbladder: Tumefactive biliary sludge is identified within the gallbladder. The single wall thickness of the gallbladder is 4.2 mm, normal less than 3 mm. No pericholecystic fluid.  No sonographic Murphy sign noted by sonographer. Common bile duct: Diameter: 5 mm Liver: No focal lesion identified given limitations due to the patient's inability to adequately position for better images. Within normal limits in parenchymal echogenicity. Portal vein is patent on color Doppler imaging though indeterminate in direction of blood flow given mixed color signal noted within. Nodular liver surface compatible with cirrhosis. Adjacent small volume of ascites. IMPRESSION: 1. Thick-walled gallbladder with biliary sludge noted within. 2. Cirrhotic appearing liver with adjacent small volume of perihepatic ascites. Mixed color Doppler signal within the imaged  portal venous system may represent early changes of portal hypertension though the portal vein is patent. Electronically Signed   By: Ashley Royalty M.D.   On: 12/17/2017 19:55     Not all labs, radiology exams or other studies done during hospitalization come through on my EPIC note; however they are reviewed by me.    Assessment and Plan  Acute cholecystitis- GI consulted, ERCP performed CBD did not show any stone, there were no gallstones and gallbladder wall was thickened; general surgery consulted and felt surgery was too high risk for patient and recommended percutaneous cholecystostomy tube placement which was done on 5/10; patient has some pleuritic chest pain over the insertion of the tube culture from the drain grew multiple species, no anaerobes; patient was on IV cefepime and Flagyl and was transitioned to oral Keflex and Flagyl SNF -patient admitted for OT/PT and for drain care patient to continue Keflex 500 mg every 12 hours for 3 more days and Flagyl 500 mg every 8 hours for 3 more days  Sinus tachycardia-initially thought to be atrial fibrillation but was not; multiple PACs; patient was started on beta-blocker SNF -continue metoprolol 25 mg twice daily  Liver cirrhosis-new diagnosis- alcoholic plus [facility] -patient to follow-up with GI as outpatient; continue lactulose 10 g p.o. daily  Hypertension-patient on 3 blood pressure meds at home currently on hold, beta-blocker was initiated SNF -continue metoprolol 25 mg twice daily  Hyperlipidemia SNF -not stated as uncontrolled; continue Lipitor 40 mg daily  GERD SNF -continue omeprazole 40 mg daily  Gout SNF -continue prophylaxis with Uloric 80 mg daily   Spent greater than 45 minutes;> 50% of time with patient was spent reviewing records, labs, tests and studies, counseling and developing plan of care  Inocencio Homes, MD

## 2017-12-29 ENCOUNTER — Emergency Department (HOSPITAL_COMMUNITY): Payer: Medicare Other

## 2017-12-29 ENCOUNTER — Encounter (HOSPITAL_COMMUNITY): Payer: Self-pay | Admitting: Family Medicine

## 2017-12-29 ENCOUNTER — Inpatient Hospital Stay (HOSPITAL_COMMUNITY)
Admission: EM | Admit: 2017-12-29 | Discharge: 2018-01-10 | DRG: 871 | Disposition: E | Payer: Medicare Other | Attending: Internal Medicine | Admitting: Internal Medicine

## 2017-12-29 DIAGNOSIS — I700 Atherosclerosis of aorta: Secondary | ICD-10-CM | POA: Diagnosis present

## 2017-12-29 DIAGNOSIS — M6281 Muscle weakness (generalized): Secondary | ICD-10-CM | POA: Diagnosis not present

## 2017-12-29 DIAGNOSIS — N179 Acute kidney failure, unspecified: Secondary | ICD-10-CM | POA: Diagnosis not present

## 2017-12-29 DIAGNOSIS — K117 Disturbances of salivary secretion: Secondary | ICD-10-CM | POA: Diagnosis not present

## 2017-12-29 DIAGNOSIS — Z66 Do not resuscitate: Secondary | ICD-10-CM | POA: Diagnosis present

## 2017-12-29 DIAGNOSIS — R1310 Dysphagia, unspecified: Secondary | ICD-10-CM

## 2017-12-29 DIAGNOSIS — J690 Pneumonitis due to inhalation of food and vomit: Secondary | ICD-10-CM | POA: Diagnosis not present

## 2017-12-29 DIAGNOSIS — Z9079 Acquired absence of other genital organ(s): Secondary | ICD-10-CM | POA: Diagnosis not present

## 2017-12-29 DIAGNOSIS — K210 Gastro-esophageal reflux disease with esophagitis: Secondary | ICD-10-CM | POA: Diagnosis not present

## 2017-12-29 DIAGNOSIS — Z96642 Presence of left artificial hip joint: Secondary | ICD-10-CM | POA: Diagnosis not present

## 2017-12-29 DIAGNOSIS — R2681 Unsteadiness on feet: Secondary | ICD-10-CM | POA: Diagnosis not present

## 2017-12-29 DIAGNOSIS — R58 Hemorrhage, not elsewhere classified: Secondary | ICD-10-CM | POA: Diagnosis present

## 2017-12-29 DIAGNOSIS — R531 Weakness: Secondary | ICD-10-CM | POA: Diagnosis not present

## 2017-12-29 DIAGNOSIS — I6521 Occlusion and stenosis of right carotid artery: Secondary | ICD-10-CM | POA: Diagnosis not present

## 2017-12-29 DIAGNOSIS — Z923 Personal history of irradiation: Secondary | ICD-10-CM

## 2017-12-29 DIAGNOSIS — Z515 Encounter for palliative care: Secondary | ICD-10-CM

## 2017-12-29 DIAGNOSIS — R05 Cough: Secondary | ICD-10-CM | POA: Diagnosis not present

## 2017-12-29 DIAGNOSIS — K7030 Alcoholic cirrhosis of liver without ascites: Secondary | ICD-10-CM | POA: Diagnosis present

## 2017-12-29 DIAGNOSIS — K810 Acute cholecystitis: Secondary | ICD-10-CM | POA: Diagnosis not present

## 2017-12-29 DIAGNOSIS — K819 Cholecystitis, unspecified: Secondary | ICD-10-CM | POA: Diagnosis not present

## 2017-12-29 DIAGNOSIS — R0603 Acute respiratory distress: Secondary | ICD-10-CM | POA: Diagnosis not present

## 2017-12-29 DIAGNOSIS — A419 Sepsis, unspecified organism: Secondary | ICD-10-CM | POA: Diagnosis not present

## 2017-12-29 DIAGNOSIS — F1010 Alcohol abuse, uncomplicated: Secondary | ICD-10-CM

## 2017-12-29 DIAGNOSIS — E785 Hyperlipidemia, unspecified: Secondary | ICD-10-CM | POA: Diagnosis not present

## 2017-12-29 DIAGNOSIS — I10 Essential (primary) hypertension: Secondary | ICD-10-CM | POA: Diagnosis not present

## 2017-12-29 DIAGNOSIS — G4733 Obstructive sleep apnea (adult) (pediatric): Secondary | ICD-10-CM | POA: Diagnosis present

## 2017-12-29 DIAGNOSIS — J9601 Acute respiratory failure with hypoxia: Secondary | ICD-10-CM | POA: Diagnosis not present

## 2017-12-29 DIAGNOSIS — R0902 Hypoxemia: Secondary | ICD-10-CM

## 2017-12-29 DIAGNOSIS — Z85828 Personal history of other malignant neoplasm of skin: Secondary | ICD-10-CM | POA: Diagnosis not present

## 2017-12-29 DIAGNOSIS — R0602 Shortness of breath: Secondary | ICD-10-CM | POA: Diagnosis not present

## 2017-12-29 DIAGNOSIS — Z8546 Personal history of malignant neoplasm of prostate: Secondary | ICD-10-CM

## 2017-12-29 DIAGNOSIS — Y95 Nosocomial condition: Secondary | ICD-10-CM

## 2017-12-29 DIAGNOSIS — E870 Hyperosmolality and hypernatremia: Secondary | ICD-10-CM | POA: Diagnosis present

## 2017-12-29 DIAGNOSIS — R0682 Tachypnea, not elsewhere classified: Secondary | ICD-10-CM

## 2017-12-29 DIAGNOSIS — F411 Generalized anxiety disorder: Secondary | ICD-10-CM | POA: Diagnosis not present

## 2017-12-29 DIAGNOSIS — K7031 Alcoholic cirrhosis of liver with ascites: Secondary | ICD-10-CM | POA: Diagnosis present

## 2017-12-29 DIAGNOSIS — J189 Pneumonia, unspecified organism: Secondary | ICD-10-CM

## 2017-12-29 DIAGNOSIS — R262 Difficulty in walking, not elsewhere classified: Secondary | ICD-10-CM | POA: Diagnosis not present

## 2017-12-29 DIAGNOSIS — G4730 Sleep apnea, unspecified: Secondary | ICD-10-CM | POA: Diagnosis present

## 2017-12-29 LAB — CBC WITH DIFFERENTIAL/PLATELET
Basophils Absolute: 0 10*3/uL (ref 0.0–0.1)
Basophils Relative: 0 %
EOS PCT: 0 %
Eosinophils Absolute: 0 10*3/uL (ref 0.0–0.7)
HEMATOCRIT: 36.1 % — AB (ref 39.0–52.0)
HEMOGLOBIN: 12.2 g/dL — AB (ref 13.0–17.0)
LYMPHS ABS: 1.6 10*3/uL (ref 0.7–4.0)
LYMPHS PCT: 10 %
MCH: 33.7 pg (ref 26.0–34.0)
MCHC: 33.8 g/dL (ref 30.0–36.0)
MCV: 99.7 fL (ref 78.0–100.0)
MONOS PCT: 4 %
Monocytes Absolute: 0.6 10*3/uL (ref 0.1–1.0)
NEUTROS ABS: 13.7 10*3/uL — AB (ref 1.7–7.7)
Neutrophils Relative %: 86 %
Platelets: 206 10*3/uL (ref 150–400)
RBC: 3.62 MIL/uL — ABNORMAL LOW (ref 4.22–5.81)
RDW: 15.8 % — ABNORMAL HIGH (ref 11.5–15.5)
WBC MORPHOLOGY: INCREASED
WBC: 15.9 10*3/uL — ABNORMAL HIGH (ref 4.0–10.5)

## 2017-12-29 LAB — COMPREHENSIVE METABOLIC PANEL
ALK PHOS: 126 U/L (ref 38–126)
ALT: 13 U/L — AB (ref 17–63)
ANION GAP: 13 (ref 5–15)
AST: 39 U/L (ref 15–41)
Albumin: 2.2 g/dL — ABNORMAL LOW (ref 3.5–5.0)
BILIRUBIN TOTAL: 2.9 mg/dL — AB (ref 0.3–1.2)
BUN: 34 mg/dL — AB (ref 6–20)
CALCIUM: 8.4 mg/dL — AB (ref 8.9–10.3)
CO2: 18 mmol/L — AB (ref 22–32)
CREATININE: 2.38 mg/dL — AB (ref 0.61–1.24)
Chloride: 111 mmol/L (ref 101–111)
GFR calc Af Amer: 27 mL/min — ABNORMAL LOW (ref >60)
GFR calc non Af Amer: 24 mL/min — ABNORMAL LOW (ref >60)
Glucose, Bld: 84 mg/dL (ref 65–99)
Potassium: 5 mmol/L (ref 3.5–5.1)
Sodium: 142 mmol/L (ref 135–145)
TOTAL PROTEIN: 5.8 g/dL — AB (ref 6.5–8.1)

## 2017-12-29 LAB — I-STAT ARTERIAL BLOOD GAS, ED
ACID-BASE DEFICIT: 8 mmol/L — AB (ref 0.0–2.0)
BICARBONATE: 14.9 mmol/L — AB (ref 20.0–28.0)
O2 SAT: 93 %
TCO2: 16 mmol/L — AB (ref 22–32)
pCO2 arterial: 22.4 mmHg — ABNORMAL LOW (ref 32.0–48.0)
pH, Arterial: 7.429 (ref 7.350–7.450)
pO2, Arterial: 62 mmHg — ABNORMAL LOW (ref 83.0–108.0)

## 2017-12-29 LAB — LACTIC ACID, PLASMA
Lactic Acid, Venous: 3.9 mmol/L (ref 0.5–1.9)
Lactic Acid, Venous: 2.5 mmol/L (ref 0.5–1.9)

## 2017-12-29 LAB — I-STAT CG4 LACTIC ACID, ED: Lactic Acid, Venous: 4.61 mmol/L (ref 0.5–1.9)

## 2017-12-29 LAB — PROCALCITONIN: PROCALCITONIN: 6.01 ng/mL

## 2017-12-29 LAB — BRAIN NATRIURETIC PEPTIDE: B Natriuretic Peptide: 444.8 pg/mL — ABNORMAL HIGH (ref 0.0–100.0)

## 2017-12-29 MED ORDER — PIPERACILLIN-TAZOBACTAM IN DEX 2-0.25 GM/50ML IV SOLN: 2.2500 g | Freq: Once | INTRAVENOUS | Status: DC

## 2017-12-29 MED ORDER — FLUTICASONE PROPIONATE 50 MCG/ACT NA SUSP
2.0000 | Freq: Every day | NASAL | Status: DC
  Filled 2017-12-29: qty 16

## 2017-12-29 MED ORDER — THIAMINE HCL 100 MG/ML IJ SOLN
100.0000 mg | Freq: Every day | INTRAMUSCULAR | Status: DC
  Administered 2017-12-29: 100 mg via INTRAVENOUS
  Filled 2017-12-29: qty 2

## 2017-12-29 MED ORDER — LORAZEPAM 1 MG PO TABS: 1.0000 mg | ORAL_TABLET | Freq: Four times a day (QID) | ORAL | Status: DC | PRN

## 2017-12-29 MED ORDER — IOPAMIDOL (ISOVUE-370) INJECTION 76%
100.0000 mL | Freq: Once | INTRAVENOUS | Status: AC | PRN
  Administered 2017-12-29: 100 mL via INTRAVENOUS

## 2017-12-29 MED ORDER — SODIUM CHLORIDE 0.9 % IV BOLUS
1250.0000 mL | Freq: Once | INTRAVENOUS | Status: AC
  Administered 2017-12-29: 1250 mL via INTRAVENOUS

## 2017-12-29 MED ORDER — HEPARIN SODIUM (PORCINE) 5000 UNIT/ML IJ SOLN
5000.0000 [IU] | Freq: Three times a day (TID) | INTRAMUSCULAR | Status: DC
  Administered 2017-12-29 – 2017-12-31 (×3): 5000 [IU] via SUBCUTANEOUS
  Filled 2017-12-29 (×3): qty 1

## 2017-12-29 MED ORDER — ACETAMINOPHEN 325 MG PO TABS: 650.0000 mg | ORAL_TABLET | Freq: Four times a day (QID) | ORAL | Status: DC | PRN

## 2017-12-29 MED ORDER — ACETAMINOPHEN 650 MG RE SUPP: 650.0000 mg | Freq: Four times a day (QID) | RECTAL | Status: DC | PRN

## 2017-12-29 MED ORDER — SODIUM CHLORIDE 0.9 % IV BOLUS
1000.0000 mL | Freq: Once | INTRAVENOUS | Status: AC
  Administered 2017-12-29: 1000 mL via INTRAVENOUS

## 2017-12-29 MED ORDER — LACTATED RINGERS IV SOLN
INTRAVENOUS | Status: DC
  Administered 2017-12-29 – 2017-12-30 (×3): via INTRAVENOUS
  Administered 2017-12-30: 1000 mL via INTRAVENOUS
  Administered 2017-12-31: 06:00:00 via INTRAVENOUS

## 2017-12-29 MED ORDER — ONDANSETRON HCL 4 MG PO TABS: 4.0000 mg | ORAL_TABLET | Freq: Four times a day (QID) | ORAL | Status: DC | PRN

## 2017-12-29 MED ORDER — LACTULOSE 10 GM/15ML PO SOLN
10.0000 g | Freq: Every day | ORAL | Status: DC
  Filled 2017-12-29: qty 15

## 2017-12-29 MED ORDER — ONDANSETRON HCL 4 MG/2ML IJ SOLN: 4.0000 mg | Freq: Four times a day (QID) | INTRAMUSCULAR | Status: DC | PRN

## 2017-12-29 MED ORDER — VITAMIN B-1 100 MG PO TABS: 100.0000 mg | ORAL_TABLET | Freq: Every day | ORAL | Status: DC

## 2017-12-29 MED ORDER — SODIUM CHLORIDE 0.9% FLUSH
3.0000 mL | Freq: Two times a day (BID) | INTRAVENOUS | Status: DC
  Administered 2017-12-29 – 2017-12-30 (×2): 3 mL via INTRAVENOUS

## 2017-12-29 MED ORDER — SODIUM CHLORIDE 0.9 % IV SOLN
3.0000 g | Freq: Two times a day (BID) | INTRAVENOUS | Status: DC
  Administered 2017-12-30 – 2017-12-31 (×3): 3 g via INTRAVENOUS
  Filled 2017-12-29 (×5): qty 3

## 2017-12-29 MED ORDER — SODIUM CHLORIDE 0.9 % IV BOLUS
500.0000 mL | Freq: Once | INTRAVENOUS | Status: AC
  Administered 2017-12-29: 500 mL via INTRAVENOUS

## 2017-12-29 MED ORDER — LORAZEPAM 2 MG/ML IJ SOLN
1.0000 mg | Freq: Four times a day (QID) | INTRAMUSCULAR | Status: DC | PRN
  Administered 2017-12-31: 1 mg via INTRAVENOUS
  Filled 2017-12-29: qty 1

## 2017-12-29 MED ORDER — IOPAMIDOL (ISOVUE-370) INJECTION 76%
INTRAVENOUS | Status: AC
  Filled 2017-12-29: qty 100

## 2017-12-29 MED ORDER — VANCOMYCIN HCL IN DEXTROSE 750-5 MG/150ML-% IV SOLN
750.0000 mg | INTRAVENOUS | Status: DC
  Administered 2017-12-30: 750 mg via INTRAVENOUS
  Filled 2017-12-29: qty 150

## 2017-12-29 MED ORDER — FEBUXOSTAT 80 MG PO TABS: 80.0000 mg | ORAL_TABLET | Freq: Every day | ORAL | Status: DC

## 2017-12-29 MED ORDER — VANCOMYCIN HCL 10 G IV SOLR
1500.0000 mg | Freq: Once | INTRAVENOUS | Status: AC
  Administered 2017-12-29: 1500 mg via INTRAVENOUS
  Filled 2017-12-29: qty 1500

## 2017-12-29 MED ORDER — PIPERACILLIN-TAZOBACTAM 3.375 G IVPB 30 MIN
3.3750 g | Freq: Once | INTRAVENOUS | Status: AC
  Administered 2017-12-29: 3.375 g via INTRAVENOUS
  Filled 2017-12-29: qty 50

## 2017-12-29 NOTE — H&P (Signed)
History and Physical  TASHI BAND BPZ:[id number] DOB: 29-May-1933 DOA: 12/15/2017  PCP: Crist Infante, MD   Chief Complaint: Trouble swallowing  HPI:  84 year old man discharged 5/14 after being treated for acute cholecystitis, status post percutaneous cholecystostomy tube drain secondary to high risk for surgery, culture treated with cefepime and Flagyl, discharged on Keflex and Flagyl; demand ischemia, new diagnosis of cirrhosis thought to be combination of alcohol induced and NASH, outpatient follow-up recommended, dysphagia evaluated speech therapy recommending a regular diet WHO PRESENTED 5/20 with trouble swallowing, cough and shortness of breath.  Admitted for sepsis with acute hypoxic respiratory failure secondary to aspiration pneumonia, acute kidney injury.  Patient has been doing well at rehab, working with physical therapy until he developed trouble swallowing over the last 2-3 days which he describes as being severe to the point where he would not eat anything.  No specific aggravating or alleviating factors.  No nausea or vomiting.  No abdominal pain.  No chest pain.  He is also had shortness of breath.  He has had no systemic symptoms.  He said no abdominal pain and no difficulty with the cholecystostomy drain.  Drinks 4 glasses of whiskey a day.  ED Course: Treated with Zosyn, vancomycin and fluid bolus.  Review of Systems:  Negative for fever, new visual changes, sore throat, rash, new muscle aches, chest pain, dysuria, bleeding, n/v/abdominal pain.  Past Medical History:  Diagnosis Date  . Arthritis   . AV block 05/13 ecg   1st degree  . Bleeding    bladder,radiation cystitis-s/p cystoscopy, clot evacuation, fulgeration of bleeders  . Cancer ([place])    prostate, hx of bsal cell skin cancer   . Cataract   . Gout   . Hematuria   . Hemorrhoid   . History of basal cell carcinoma excision 08-29-2011  left frontal scalp  . History of gout PER PT -  STABLE  . History of  prostate cancer 2004   -  RX EXTERNAL RADIATION  . Hx of subdural hematoma 08-14-2011    s/p total left hip  12-92012---  resolved w/ no residual  . Hyperlipidemia   . Hypertension   . IFG (impaired fasting glucose)   . Lab findings of hypoxemia    abnormal ONO , dr perini. 05-26-2012  . Microalbuminuria 2011  . OA (osteoarthritis) of hip   . Obstructive apnea    AHI of 35 on 01-14-13 , titrated to 6 cm H20  . Pneumonia    hx of 2015   . Shoulder impingement syndrome    right  . Sleep apnea    cpap nightly  . Sleep apnea with use of continuous positive airway pressure (CPAP) 03/19/2013    Past Surgical History:  Procedure Laterality Date  . CARDIAC CATHETERIZATION     pt denies  . COLONOSCOPY    . CYSTOSCOPY WITH BIOPSY  07/03/2012   Procedure: CYSTOSCOPY WITH BIOPSY;  Surgeon: Franchot Gallo, MD;  Location: [facility];  Service: Urology;  Laterality: N/A;      . CYSTOSCOPY WITH FULGERATION N/A 12/02/2016   Procedure: CYSTOSCOPY WITH FULGERATION OF BLEEDERS AND TRANSURETRAL INCISION OF PROSTATE;  Surgeon: Franchot Gallo, MD;  Location: [facility];  Service: Urology;  Laterality: N/A;  . EYE SURGERY  2009   bilateral cataract surgery   . IR PERC CHOLECYSTOSTOMY  12/19/2017  . JOINT REPLACEMENT    . MOHS SURGERY    . PROSTATE SURGERY    . RIGHT ELBOW SURGERY  MAR 2013  . right elbow surgery  2009   ELBOW AND WRIST SURGERY  . TONSILLECTOMY     AGE 43  . TOTAL HIP ARTHROPLASTY  07/31/2011   Procedure: TOTAL HIP ARTHROPLASTY;  Surgeon: Dione Plover Aluisio;  Location: [facility];  Service: Orthopedics;  Laterality: Left;  . TOTAL HIP ARTHROPLASTY    . TRANSURETHRAL RESECTION OF PROSTATE  07/25/2012   Procedure: TRANSURETHRAL RESECTION OF THE PROSTATE WITH GYRUS INSTRUMENTS;  Surgeon: Alexis Frock, MD;  Location: [facility];  Service: Urology;  Laterality: N/A;  CYSTOSCOPY;CLOT EVACUATION WITH FULGERATION  . ULNAR NERVE REPAIR  04/13     reports that he has never smoked.  He has never used smokeless tobacco. He reports that he drinks about 15.0 oz of alcohol per week. He reports that he does not use drugs.   Allergies  Allergen Reactions  . Allopurinol Hives and Other (See Comments)    Cause renal failure   . Ampicillin Diarrhea and Nausea And Vomiting    Family History  Problem Relation Age of Onset  . Stroke Father   . Cancer Sister        Breast cancer  . Colon cancer Neg Hx   . Stomach cancer Neg Hx   . Esophageal cancer Neg Hx      Prior to Admission medications   Medication Sig Start Date End Date Taking? Authorizing Provider  atorvastatin (LIPITOR) 40 MG tablet Take 40 mg by mouth daily.   Yes [provider]  calcium-vitamin D (OSCAL WITH D) 500-200 MG-UNIT tablet Take 1 tablet by mouth.   Yes [provider]  cetirizine (ZYRTEC) 10 MG tablet Take 10 mg by mouth daily.   Yes [provider]  cholecalciferol (VITAMIN D) 1000 units tablet Take 1,000 Units by mouth daily.   Yes [provider]  Febuxostat 80 MG TABS Take 80 mg by mouth daily.    Yes [provider]  fluticasone (FLONASE) 50 MCG/ACT nasal spray Place 2 sprays into the nose daily. ALLERGIES   Yes [provider]  lactulose (CHRONULAC) 10 GM/15ML solution Take 15 mLs (10 g total) by mouth daily. 12/24/17  Yes Lavina Hamman, MD  magnesium hydroxide (MILK OF MAGNESIA) 400 MG/5ML suspension Take 30 mLs by mouth daily as needed for mild constipation.   Yes [provider]  metoprolol tartrate (LOPRESSOR) 25 MG tablet Take 1 tablet (25 mg total) by mouth 2 (two) times daily. 12/23/17  Yes Lavina Hamman, MD  Multiple Vitamins-Minerals (MULTIVITAMINS THER. W/MINERALS) TABS Take 1 tablet by mouth daily.    Yes [provider]  omeprazole (PRILOSEC) 40 MG capsule Take 1 capsule (40 mg total) by mouth daily. 09/04/17  Yes Zehr, Laban Emperor, PA-C  potassium chloride 20 MEQ/15ML (10%) SOLN Take 15 mLs (20 mEq total) by  mouth daily. 12/24/17  Yes Lavina Hamman, MD  traMADol (ULTRAM) 50 MG tablet Take 50 mg by mouth every 6 (six) hours as needed for moderate pain.   Yes [provider]  TURMERIC PO Take 1,000 mg by mouth 2 (two) times daily.   Yes [provider]    Physical Exam: Vitals:   12/26/2017 1548 01/02/2018 1630  BP:  (!) 108/91  Pulse:  (!) 114  Resp:  (!) 38  Temp: 98.2 F (36.8 C)   SpO2:  97%    Constitutional:   . Appears calm, uncomfortable but not toxic. Eyes:  . pupils and irises appear normal . Normal lids  ENMT:  . grossly normal hearing  . Lips appear normal Neck:  . neck appears normal, no masses . no thyromegaly Respiratory:  . Fair air movement.  No frank wheezes, rales or rhonchi. . Tachypneic.  Able to speak in short sentences.  Moderate increased respiratory effort. Cardiovascular:  . RRR, no m/r/g . No LE extremity edema   Abdomen:  . Soft, nontender, nondistended.  Cholecystostomy drain in place, there is some drainage around it, there is no significant erythema or evidence of infection. . No hernias noted. . No hepatomegaly. Musculoskeletal:  . Digits/nails BUE: no clubbing, cyanosis, petechiae, infection . RUE, LUE, RLE, LLE   o strength globally diminished but symmetric.  Tone normal.  No atrophy, no abnormal movements Skin:  . No rashes, lesions, ulcers . palpation of skin: no induration or nodules Neurologic:  . Grossly nonfocal Psychiatric:  . Mental status o Mood, affect appropriate . judgment and insight appear intact    I have personally reviewed following labs and imaging studies  Labs:   ABG 7.4 10/31/1960  CO2 18, BUN 34, creatinine 2.38  Total bilirubin 2.9  BNP 444  Lactic acid 4.61  WBC 15.9.  Hemoglobin stable 12.2.  Platelets within normal limits, 206.  Imaging studies:   Chest x-ray independently reviewed, my interpretation right lower lobe infiltrate.  CT chest bibasilar airspace opacities right  greater than left consistent with pneumonia.  Pneumonitis also possible.  Cirrhotic liver.  No PE.  Medical tests:   EKG independently reviewed: Sinus rhythm, right bundle branch block (old).  Test discussed with performing physician:  As below  Principal Problem:   Sepsis ([place]) Active Problems:   Sleep apnea with use of continuous positive airway pressure (CPAP)   Alcohol abuse   Acute acalculous cholecystitis   Alcoholic cirrhosis of liver without ascites (H[facility])   Aspiration pneumonia (H[facility])   Acute hypoxemic respiratory failure (H[facility])   AKI (acute kidney injury) ([place])   Dysphagia   Assessment/Plan Sepsis with acute hypoxic respiratory failure secondary to aspiration pneumonia --Completed fluid bolus.  Normotensive.  Trend lactic acid.  Empiric antibiotics to cover anaerobes and multidrug-resistant organisms. --N.p.o.  Speech therapy evaluation 5/21. --Supplemental oxygen, wean as tolerated.  Severe dysphagia --N.p.o.  Speech therapy evaluation as above.  CTA neck showed notable spondylosis with bulky spurring at C5-6 encroaching on the hypopharynx.  Discussed with radiologist, this could be a contributory factor but would not be expected to explain the patient's sudden severe dysphagia. --Consider GI consult when patient stabilizes.  Acute kidney injury secondary to poor oral intake, dysphagia, sepsis. --Potassium within normal limits.  Trend BMP.  Aggressive IV fluids.  Cholecystitis present on last admission, status post cholecystostomy tube placement. --LFTs stable, total bilirubin improving.  Asymptomatic.  No pain.  Obstructive sleep apnea --CPAP at night if needed but given dysphagia will hold off for now.  Alcohol abuse --No evidence of withdrawal. CIWA.  Cirrhosis --Follow-up with GI as an outpatient.  Aortic atherosclerosis.  Severity of Illness: The appropriate patient status for this patient is INPATIENT. Inpatient status is judged to be reasonable and  necessary in order to provide the required intensity of service to ensure the patient's safety. The patient's presenting symptoms, physical exam findings, and initial radiographic and laboratory data in the context of their chronic comorbidities is felt to place them at high risk for further clinical deterioration. Furthermore, it is not anticipated that the patient will be medically stable for discharge from the hospital within 2 midnights of  admission. The following factors support the patient status of inpatient.   See immediately above  * I certify that at the point of admission it is my clinical judgment that the patient will require inpatient hospital care spanning beyond 2 midnights from the point of admission due to high intensity of service, high risk for further deterioration and high frequency of surveillance required.*  DVT prophylaxis: Heparin Code Status: DNR Family Communication: none Consults called: none    Time spent: 70 minutes  Murray Hodgkins, MD  [facility] Direct contact: [phone] --[address]  --[email or web address]; password [email or web address]  7PM-7AM contact night coverage as above  12/18/2017, 4:44 PM

## 2017-12-29 NOTE — ED Provider Notes (Signed)
[facility] EMERGENCY DEPARTMENT Provider Note   CSN: [id number] Arrival date & time: 12/28/2017  1119     History   Chief Complaint Chief Complaint  Patient presents with  . Dysphagia    HPI Patient is a 84 year old male with history of prostate Martin, Lance Martin, Lance Martin, Lance Martin, Lance Martin, Lance diagnosis of cirrhosis who presents from rehab facility with complaints of dysphasia.  EMS reports that patient has had difficulty swallowing both solids and liquids since Saturday.  He reports he has been unable to keep anything down since weekend.  On their arrival, he was hypoxic to 83% on room air.  He does not typically require oxygen.  Improvement 90% with up to 6 L nasal cannula. Discharged from [facility] on 5/15 to rehab facility after perc chole drain placed for cholecystitis.   Patient reports that he has had cough with sputum production for the past week.  He denies any fevers or chills, no shortness of breath  Patient is somewhat poor historian.  He has had some difficulty with swallowing and changes in his speech for the past week, although he reports that his friends say that his speech is improving.  He denies any tongue swelling.  He reports that it is difficult to initiate swallowing and also feels that food and liquid gets stuck in his throat.  Past Medical History:  Diagnosis Date  . Arthritis   . AV block 05/13 ecg   1st degree  . Bleeding    bladder,radiation cystitis-s/p cystoscopy, clot evacuation, fulgeration of bleeders  . Martin ([place])    prostate, hx of bsal cell skin Martin   . Cataract   . Lance Martin   . Hematuria   . Hemorrhoid   . History of basal cell carcinoma excision 08-29-2011  left frontal scalp  . History of Lance Martin PER PT -  STABLE  . History of prostate Martin 2004   -  RX EXTERNAL RADIATION  . Hx of subdural hematoma 08-14-2011    s/p total left hip  12-92012---  resolved w/ no residual  .  Hyperlipidemia   . Hypertension   . IFG (impaired fasting glucose)   . Lab findings of hypoxemia    abnormal ONO , dr perini. 05-26-2012  . Microalbuminuria 2011  . OA (osteoarthritis) of hip   . Obstructive apnea    AHI of 35 on 01-14-13 , titrated to 6 cm H20  . Pneumonia    hx of 2015   . Shoulder impingement syndrome    right  . Sleep apnea    cpap nightly  . Sleep apnea with use of continuous positive airway pressure (CPAP) 03/19/2013    Patient Active Problem List   Diagnosis Date Noted  . Aspiration pneumonia ([place]) 12/13/2017  . Sinus tachycardia 12/27/2017  . Alcoholic cirrhosis of liver without ascites ([place]) 12/27/2017  . Hyperlipidemia 12/27/2017  . Lance Martin 12/27/2017  . GERD (gastroesophageal reflux disease) 12/27/2017  . Abdominal pain, RUQ (right upper quadrant)   . Acute acalculous cholecystitis   . Pressure injury of skin 12/18/2017  . Abnormal LFTs (liver function tests) 12/17/2017  . Cholangitis 12/17/2017  . Sepsis ([place]) 12/17/2017  . Atrial fibrillation with RVR ([place]) 12/17/2017  . Essential hypertension 12/17/2017  . Alcohol abuse 12/17/2017  . Elevated troponin 12/17/2017  . Debility 12/17/2017  . Dehydration 12/17/2017  . Rectal bleeding 09/04/2017  . Nausea without vomiting 09/04/2017  . Sleep apnea with use  of continuous positive airway pressure (CPAP) 03/19/2013  . Obstructive sleep apnea (adult) (pediatric) 02/05/2013  . Obstructive apnea   . Hypersomnia, persistent 01/01/2013  . Lab findings of hypoxemia     Past Surgical History:  Procedure Laterality Date  . CARDIAC CATHETERIZATION     pt denies  . COLONOSCOPY    . CYSTOSCOPY WITH BIOPSY  07/03/2012   Procedure: CYSTOSCOPY WITH BIOPSY;  Surgeon: Franchot Gallo, MD;  Location[facility];  Service: Urology;  Laterality: N/A;      . CYSTOSCOPY WITH FULGERATION N/A 12/02/2016   Procedure: CYSTOSCOPY WITH FULGERATION OF BLEEDERS AND TRANSURETRAL INCISION OF PROSTATE;   Surgeon: Franchot Gallo, MD;  Location: [facility];  Service: Urology;  Laterality: N/A;  . EYE SURGERY  2009   bilateral cataract surgery   . IR PERC CHOLECYSTOSTOMY  Martin/2019  . JOINT REPLACEMENT    . MOHS SURGERY    . PROSTATE SURGERY    . RIGHT ELBOW SURGERY  MAR 2013  . right elbow surgery  2009   ELBOW AND WRIST SURGERY  . TONSILLECTOMY     AGE 27  . TOTAL HIP ARTHROPLASTY  07/31/2011   Procedure: TOTAL HIP ARTHROPLASTY;  Surgeon: Dione Plover Aluisio;  Location: [facility];  Service: Orthopedics;  Laterality: Left;  . TOTAL HIP ARTHROPLASTY    . TRANSURETHRAL RESECTION OF PROSTATE  07/25/2012   Procedure: TRANSURETHRAL RESECTION OF THE PROSTATE WITH GYRUS INSTRUMENTS;  Surgeon: Alexis Frock, MD;  Location: [facility];  Service: Urology;  Laterality: N/A;  CYSTOSCOPY;CLOT EVACUATION WITH FULGERATION  . ULNAR NERVE REPAIR  04/13        Home Medications    Prior to Admission medications   Medication Sig Start Date End Date Taking? Authorizing Provider  atorvastatin (LIPITOR) 40 MG tablet Take 40 mg by mouth daily.   Yes [provider]  calcium-vitamin D (OSCAL WITH D) 500-200 MG-UNIT tablet Take 1 tablet by mouth.   Yes [provider]  cetirizine (ZYRTEC) 10 MG tablet Take 10 mg by mouth daily.   Yes [provider]  cholecalciferol (VITAMIN D) 1000 units tablet Take 1,000 Units by mouth daily.   Yes [provider]  Febuxostat 80 MG TABS Take 80 mg by mouth daily.    Yes [provider]  fluticasone (FLONASE) 50 MCG/ACT nasal spray Place 2 sprays into the nose daily. ALLERGIES   Yes [provider]  lactulose (CHRONULAC) 10 GM/15ML solution Take 15 mLs (10 g total) by mouth daily. 12/24/17  Yes Lavina Hamman, MD  magnesium hydroxide (MILK OF MAGNESIA) 400 MG/5ML suspension Take 30 mLs by mouth daily as needed for mild constipation.   Yes [provider]  metoprolol tartrate (LOPRESSOR) 25 MG tablet Take 1 tablet (25 mg  total) by mouth 2 (two) times daily. 12/23/17  Yes Lavina Hamman, MD  Multiple Vitamins-Minerals (MULTIVITAMINS THER. W/MINERALS) TABS Take 1 tablet by mouth daily.    Yes [provider]  omeprazole (PRILOSEC) 40 MG capsule Take 1 capsule (40 mg total) by mouth daily. 09/04/17  Yes Zehr, Laban Emperor, PA-C  potassium chloride 20 MEQ/15ML (10%) SOLN Take 15 mLs (20 mEq total) by mouth daily. 12/24/17  Yes Lavina Hamman, MD  traMADol (ULTRAM) 50 MG tablet Take 50 mg by mouth every 6 (six) hours as needed for moderate pain.   Yes [provider]  TURMERIC PO Take 1,000 mg by mouth 2 (two) times daily.   Yes [provider]  Family History Family History  Problem Relation Age of Onset  . Stroke Father   . Martin Sister        Breast Martin  . Colon Martin Neg Hx   . Stomach Martin Neg Hx   . Esophageal Martin Neg Hx     Social History Social History   Tobacco Use  . Smoking status: Never Smoker  . Smokeless tobacco: Never Used  Substance Use Topics  . Alcohol use: Yes    Alcohol/week: 15.0 oz    Types: 4 Glasses of wine, 21 Shots of liquor per week    Comment: 4 drinks per day , all in whiskey category, 1/2 glass of wine at dinner   . Drug use: No     Allergies   Allopurinol and Ampicillin   Review of Systems Review of Systems  Constitutional: Negative for chills and fever.  HENT: Positive for trouble swallowing and voice change. Negative for ear pain and sore throat.   Eyes: Negative for pain and visual disturbance.  Respiratory: Positive for cough. Negative for shortness of breath.   Cardiovascular: Negative for chest pain and palpitations.  Gastrointestinal: Negative for abdominal pain and vomiting.  Genitourinary: Negative for dysuria and hematuria.  Musculoskeletal: Negative for arthralgias and back pain.  Skin: Negative for color change and rash.  Neurological: Negative for seizures and syncope.  All other systems reviewed and are  negative.    Physical Exam Updated Vital Signs BP 104/69   Pulse (!) 114   Temp 98.2 F (36.8 C) (Rectal)   Resp (!) 24   SpO2 94%   Physical Exam  Constitutional: He appears well-developed and well-nourished.  HENT:  Head: Normocephalic and atraumatic.  Eyes: Conjunctivae are normal.  Neck: Neck supple.  Cardiovascular: Normal rate and regular rhythm.  No murmur heard. Pulmonary/Chest: Tachypnea noted. He is in respiratory distress. He has rales.  Abdominal: Soft. There is no tenderness.  Perc chole drain in place, dressing c/d/i  Musculoskeletal: He exhibits edema (bilateral pitting edema).  Neurological: He is alert.  Skin: Skin is warm and dry.  Psychiatric: He has a normal mood and affect.  Nursing note and vitals reviewed.    ED Treatments / Results  Labs (all labs ordered are listed, but only abnormal results are displayed) Labs Reviewed  CBC WITH DIFFERENTIAL/PLATELET - Abnormal; Notable for the following components:      Result Value   WBC 15.9 (*)    RBC 3.62 (*)    Hemoglobin 12.2 (*)    HCT 36.1 (*)    RDW 15.8 (*)    Neutro Abs 13.7 (*)    All other components within normal limits  COMPREHENSIVE METABOLIC PANEL - Abnormal; Notable for the following components:   CO2 18 (*)    BUN 34 (*)    Creatinine, Ser 2.38 (*)    Calcium 8.4 (*)    Total Protein 5.8 (*)    Albumin 2.2 (*)    ALT 13 (*)    Total Bilirubin 2.9 (*)    GFR calc non Af Amer 24 (*)    GFR calc Af Amer 27 (*)    All other components within normal limits  BRAIN NATRIURETIC PEPTIDE - Abnormal; Notable for the following components:   B Natriuretic Peptide 444.8 (*)    All other components within normal limits  I-STAT ARTERIAL BLOOD GAS, ED - Abnormal; Notable for the following components:   pCO2 arterial 22.4 (*)    pO2, Arterial 62.0 (*)  Bicarbonate 14.9 (*)    TCO2 16 (*)    Acid-base deficit 8.0 (*)    All other components within normal limits  I-STAT CG4 LACTIC ACID, ED  - Abnormal; Notable for the following components:   Lactic Acid, Venous 4.61 (*)    All other components within normal limits  CULTURE, BLOOD (ROUTINE X 2)  CULTURE, BLOOD (ROUTINE X 2)  LACTIC ACID, PLASMA    EKG None  Radiology Ct Angio Neck W And/or Wo Contrast  Result Date: 12/17/2017 CLINICAL DATA:  Dysphagia, unexplained EXAM: CT ANGIOGRAPHY NECK TECHNIQUE: Multidetector CT imaging of the neck was performed using the standard protocol during bolus administration of intravenous contrast. Multiplanar CT image reconstructions and MIPs were obtained to evaluate the vascular anatomy. Carotid stenosis measurements (when applicable) are obtained utilizing NASCET criteria, using the distal internal carotid diameter as the denominator. CONTRAST:  11mL ISOVUE-370 IOPAMIDOL (ISOVUE-370) INJECTION 76% COMPARISON:  None. FINDINGS: Aortic arch: Atherosclerotic plaque. No dilatation or dissection. Three vessel branching. Right carotid system: Atheromatous wall thickening and calcification of the common carotid. Moderate to bulky plaque at the common carotid bifurcation and ICA bulb. ICA bulb stenosis measures up to 60%. Left carotid system: Atheromatous wall thickening of the common carotid. There is moderate plaque at the ICA bulb without stenosis or ulceration. Vertebral arteries: Proximal subclavian atherosclerosis without flow limiting stenosis. There is calcified plaque at the right vertebral origin with high-grade narrowing-the lumen is not visible for measurement. The non dominant left vertebral artery is occluded proximally with V2 segment reconstitution reaching the dura. Skeleton: History of dysphagia. There is diffuse spondylosis with fairly bulky spurring at C5-6, encroaching on the hypopharynx. Other neck: No noted mass or inflammation. Upper chest: Reported on dedicated chest CT. IMPRESSION: 1. History of dysphagia with no specific vascular cause seen. 2. Cervical carotid atherosclerosis with 60%  right ICA stenosis. No flow limiting stenosis on the left. 3. High-grade atheromatous stenosis at the right vertebral origin. 4. Occlusion at the non dominant left vertebral origin with reconstitution in the neck. Both vertebral arteries are patent to the basilar. 5. Notable spondylosis with bulky spurring at C5-6 encroaching on the hypopharynx. 6. Chest CT reported separately. Electronically Signed   By: Monte Fantasia M.D.   On: 12/22/2017 14:17   Ct Angio Chest Pe W And/or Wo Contrast  Result Date: 01/04/2018 CLINICAL DATA:  Disc fascia since Saturday.  Cough. EXAM: CT ANGIOGRAPHY CHEST WITH CONTRAST TECHNIQUE: Multidetector CT imaging of the chest was performed using the standard protocol during bolus administration of intravenous contrast. Multiplanar CT image reconstructions and MIPs were obtained to evaluate the vascular anatomy. CONTRAST:  13mL ISOVUE-370 IOPAMIDOL (ISOVUE-370) INJECTION 76% COMPARISON:  Same day CXR FINDINGS: Cardiovascular: Dimensional branch pattern of the great vessels with atherosclerotic origins. Moderate aortic atherosclerosis without aneurysm. Satisfactory opacification of the pulmonary arteries to the segmental level without acute pulmonary embolus noted. Coronary arteriosclerosis is identified along the LAD. Mediastinum/Nodes: Small middle mediastinal lymph nodes without pathologic enlargement, the largest is right lower paratracheal measuring up to 9 mm short axis. Patent trachea and mainstem bronchi. Esophagus is unremarkable. Thyroid is normal. Lungs/Pleura: Patchy pulmonary consolidations in the lingula and both lower lobes suspicious for areas of alveolitis/pneumonitis and/or pneumonia. Subpleural areas of minimal airspace disease in both upper lobes and along the dependent aspect of left upper lobe. Small right pleural effusion and trace left. Mild peribronchial thickening to both lower lobes. Upper Abdomen: Percutaneous cholecystostomy tube noted. Small to moderate  volume of perihepatic  ascites. Slight surface nodularity of the liver compatible with changes of cirrhosis. No splenomegaly. Mild mesenteric edema noted of the included upper abdomen. Musculoskeletal: No acute nor suspicious osseous abnormalities. Review of the MIP images confirms the above findings. IMPRESSION: 1. Bibasilar airspace opacities compatible with atelectasis and/or minimal pneumonia, right greater than left with associated trace left and small right pleural effusions. Faint ground-glass opacities and atelectasis noted elsewhere within the lungs bilaterally. Alveolitis/pneumonitis or stigmata of mild pulmonary edema might also account for this appearance. 2. Cirrhotic liver with small to moderate volume of perihepatic ascites. Cholecystostomy tube in place. 3. No acute pulmonary embolus. Aortic Atherosclerosis (ICD10-I70.0). Electronically Signed   By: Ashley Royalty M.D.   On: 01/01/2018 14:12   Dg Chest Portable 1 View  Result Date: 12/21/2017 CLINICAL DATA:  Hypoxia EXAM: PORTABLE CHEST 1 VIEW COMPARISON:  Dec 21, 2017 FINDINGS: There is underlying fibrotic change throughout the lungs bilaterally. There is no frank edema or consolidation. Heart is upper normal in size with pulmonary vascularity within normal limits. There is aortic atherosclerosis. No adenopathy. No bone lesions. IMPRESSION: Underlying fibrotic change bilaterally. No edema or consolidation. Heart size normal. There is aortic atherosclerosis. Aortic Atherosclerosis (ICD10-I70.0). Electronically Signed   By: Lowella Grip III M.D.   On: 12/14/2017 12:03    Procedures Procedures (including critical care time)  Medications Ordered in ED Medications  iopamidol (ISOVUE-370) 76 % injection (has no administration in time range)  vancomycin (VANCOCIN) 1,500 mg in sodium chloride 0.9 % 500 mL IVPB (1,500 mg Intravenous [facility] Bag/Given 12/18/2017 1501)  vancomycin (VANCOCIN) IVPB 750 mg/150 ml premix (has no administration in time  range)  iopamidol (ISOVUE-370) 76 % injection 100 mL (100 mLs Intravenous Contrast Given 12/11/2017 1342)  sodium chloride 0.9 % bolus 1,000 mL (0 mLs Intravenous Stopped 12/19/2017 1426)  sodium chloride 0.9 % bolus 1,250 mL (0 mLs Intravenous Stopped 01/08/2018 1547)  piperacillin-tazobactam (ZOSYN) IVPB 3.375 g (0 g Intravenous Stopped 01/06/2018 1531)     Initial Impression / Assessment and Plan / ED Course  I have reviewed the triage vital signs and the nursing notes.  Pertinent labs & imaging results that were available during my care of the patient were reviewed by me and considered in my medical decision making (see chart for details).     Patient is a 84 year old male with history of prostate Martin, Lance Martin, Lance Martin, Lance Martin, Lance Martin, Lance diagnosis of cirrhosis who presents from rehab facility with complaints of dysphasia at facility, worsening since Saturday.  Hypoxic to 83% on room air on EMS arrival.  Patient arrived tachypneic, saturating 90% on 5-6 L nasal cannula.  Lungs with rales bilaterally.  Exam otherwise as above.  Records reviewed.  Patient had symptoms of dysphasia during [facility] hospitalization.  He was evaluated by speech therapy, underwent modified barium swallow study, demonstrated mild aspiration risk.  Discharged on normal diet.  Labs and imaging obtained, significant for leukocytosis to 16, AKI to 2.38. Lactate elevated to 4.6. ABG 7.12/01/60/15. CXR negative for acute findings. CT chest/neck showing pneumonia and bilateral effusions Treated with broad spectrum antibiotics and 30cc/kg IV fluids.   Discussed with Hospitalist for admission for sepsis and hypoxic respiratory failure secondary to hospital acquired pneumonia.   Patient and plan of care discussed with Attending physician, Dr. Jeanell Sparrow.    Final Clinical Impressions(s) / ED Diagnoses   Final diagnoses:  Hospital-acquired pneumonia  Hypoxia  AKI (acute kidney  injury) ([place])  Dysphagia, unspecified type    ED Discharge Orders    None       Arnetha Massy, MD 12/24/2017 1622    Pattricia Boss, MD 12/30/17 1615    Pattricia Boss, MD 01/12/18 1353

## 2017-12-29 NOTE — ED Triage Notes (Signed)
Per EMS- pt has had dysphagia for since Saturday. Pt states that he has hx of same but this has worsened. Pt also reports a cough and "vibration sensation" in his ears. Denies pain. No neuro deficits with EMS

## 2017-12-29 NOTE — Progress Notes (Signed)
Pharmacy Antibiotic Note  [facility] Martin is a 84 y.o. male admitted on 12/11/2017 with pneumonia.  Pharmacy has been consulted for vancomycin dosing. Pt is afebrile and WBC is elevated at 15.9. SCr is elevated at 2.38. Lactic acid is significantly elevated at 4.61.   Plan: Vancomycin 1500mg  IV x 1 then 750mg  IV Q24H F/u renal fxn, C&S, clinical status and trough at [facility] F/u continuation of zosyn or other gram negative coverage     Temp (24hrs), Avg:98.1 F (36.7 C), Min:98.1 F (36.7 C), Max:98.1 F (36.7 C)  Recent Labs  Lab 12/23/17 0439 12/30/2017 1304 12/22/2017 1315  WBC 11.6* 15.9*  --   CREATININE 1.14 2.38*  --   LATICACIDVEN  --   --  4.61*    Estimated Creatinine Clearance: 22.8 mL/min (A) (by C-G formula based on SCr of 2.38 mg/dL (H)).    Allergies  Allergen Reactions  . Allopurinol Hives and Other (See Comments)    Cause renal failure   . Ampicillin Diarrhea and Nausea And Vomiting    Antimicrobials this admission: Vanc 5/20>> Zosyn x 1 5/20  Dose adjustments this admission: N/A  Microbiology results: Pending  Thank you for allowing pharmacy to be a part of this patient's care.  Slevin Gunby, Rande Lawman 12/21/2017 2:41 PM

## 2017-12-29 NOTE — Progress Notes (Addendum)
Pharmacy Antibiotic Note  [facility] Martin is a 84 y.o. male admitted on 12/18/2017 with pneumonia.  Pharmacy has been consulted for vancomycin dosing. Pt is afebrile and WBC is elevated at 15.9. SCr is elevated at 2.38. Lactic acid is significantly elevated at 4.61.   Patient received a dose of Zosyn in the ED. Now adding Unasyn for continued gram negative coverage. Patient has an intolerance of N/V/D listed to ampicillin but tolerated Zosyn.   Plan: Start Unasyn 3 gm IV Q 12 hours Vancomycin 1500mg  IV x 1 then 750mg  IV Q24H F/u renal fxn, C&S, clinical status and trough at [facility]      Temp (24hrs), Avg:98.2 F (36.8 C), Min:98.1 F (36.7 C), Max:98.2 F (36.8 C)  Recent Labs  Lab 12/23/17 0439 12/10/2017 1304 12/30/2017 1315  WBC 11.6* 15.9*  --   CREATININE 1.14 2.38*  --   LATICACIDVEN  --   --  4.61*    Estimated Creatinine Clearance: 22.8 mL/min (A) (by C-G formula based on SCr of 2.38 mg/dL (H)).    Allergies  Allergen Reactions  . Allopurinol Hives and Other (See Comments)    Cause renal failure   . Ampicillin Diarrhea and Nausea And Vomiting    Antimicrobials this admission: Vanc 5/20>> Zosyn x 1 5/20  Dose adjustments this admission: N/A  Microbiology results: Pending  Thank you for allowing pharmacy to be a part of this patient's care.  Albertina Parr, PharmD., BCPS Clinical Pharmacist Clinical phone for 01/05/2018 until 11pm: [phone] If after 11pm, please call main pharmacy at: [phone]

## 2017-12-30 ENCOUNTER — Other Ambulatory Visit: Payer: Self-pay

## 2017-12-30 DIAGNOSIS — R1310 Dysphagia, unspecified: Secondary | ICD-10-CM

## 2017-12-30 DIAGNOSIS — K7030 Alcoholic cirrhosis of liver without ascites: Secondary | ICD-10-CM

## 2017-12-30 DIAGNOSIS — K819 Cholecystitis, unspecified: Secondary | ICD-10-CM

## 2017-12-30 DIAGNOSIS — K810 Acute cholecystitis: Secondary | ICD-10-CM

## 2017-12-30 LAB — CBC
HEMATOCRIT: 32.7 % — AB (ref 39.0–52.0)
HEMOGLOBIN: 11 g/dL — AB (ref 13.0–17.0)
MCH: 34.3 pg — ABNORMAL HIGH (ref 26.0–34.0)
MCHC: 33.6 g/dL (ref 30.0–36.0)
MCV: 101.9 fL — AB (ref 78.0–100.0)
PLATELETS: 169 10*3/uL (ref 150–400)
RBC: 3.21 MIL/uL — AB (ref 4.22–5.81)
RDW: 16 % — ABNORMAL HIGH (ref 11.5–15.5)
WBC: 15.8 10*3/uL — ABNORMAL HIGH (ref 4.0–10.5)

## 2017-12-30 LAB — LACTIC ACID, PLASMA: Lactic Acid, Venous: 1.9 mmol/L (ref 0.5–1.9)

## 2017-12-30 LAB — BASIC METABOLIC PANEL
ANION GAP: 6 (ref 5–15)
BUN: 35 mg/dL — ABNORMAL HIGH (ref 6–20)
CHLORIDE: 120 mmol/L — AB (ref 101–111)
CO2: 18 mmol/L — AB (ref 22–32)
CREATININE: 2.05 mg/dL — AB (ref 0.61–1.24)
Calcium: 7.5 mg/dL — ABNORMAL LOW (ref 8.9–10.3)
GFR calc Af Amer: 33 mL/min — ABNORMAL LOW (ref >60)
GFR calc non Af Amer: 28 mL/min — ABNORMAL LOW (ref >60)
Glucose, Bld: 81 mg/dL (ref 65–99)
Potassium: 4.6 mmol/L (ref 3.5–5.1)
Sodium: 144 mmol/L (ref 135–145)

## 2017-12-30 LAB — MRSA PCR SCREENING: MRSA BY PCR: NEGATIVE

## 2017-12-30 MED ORDER — ALBUTEROL SULFATE (2.5 MG/3ML) 0.083% IN NEBU: 2.5000 mg | INHALATION_SOLUTION | RESPIRATORY_TRACT | Status: DC | PRN

## 2017-12-30 MED ORDER — SODIUM CHLORIDE 0.9 % IV BOLUS
500.0000 mL | Freq: Once | INTRAVENOUS | Status: AC
  Administered 2017-12-30: 500 mL via INTRAVENOUS

## 2017-12-30 MED ORDER — IPRATROPIUM-ALBUTEROL 0.5-2.5 (3) MG/3ML IN SOLN
3.0000 mL | Freq: Four times a day (QID) | RESPIRATORY_TRACT | Status: DC
  Administered 2017-12-30 – 2017-12-31 (×4): 3 mL via RESPIRATORY_TRACT
  Filled 2017-12-30 (×4): qty 3

## 2017-12-30 MED ORDER — ACETYLCYSTEINE 20 % IN SOLN
4.0000 mL | Freq: Three times a day (TID) | RESPIRATORY_TRACT | Status: DC
  Administered 2017-12-30 (×2): 4 mL via RESPIRATORY_TRACT
  Filled 2017-12-30 (×8): qty 4

## 2017-12-30 MED ORDER — FEBUXOSTAT 40 MG PO TABS
80.0000 mg | ORAL_TABLET | Freq: Every day | ORAL | Status: DC
  Filled 2017-12-30: qty 2

## 2017-12-30 NOTE — ED Notes (Signed)
Patient was incont  Of urine and stool, moderate amt. Soft stool , patient was cleaned and bed linens changed. Patient has a pencil eraser size broken area on his sacral area. Pad applied. scrotum is red with several small area are starting to break down. Barrier cream applied. Patient verbalized comfort

## 2017-12-30 NOTE — Consult Note (Addendum)
[facility] Consult: 9:15 AM 12/30/2017  LOS: 1 day    Referring Provider: Dr Sloan Leiter  Primary Care Physician:  Crist Infante, MD Primary Gastroenterologist:  Dr. Henrene Pastor.      Reason for Consultation:  Aspiration PNA and dysphagia.     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   Hx gout.  Htn.  Prostate cancer.  S/p TURP, prostate radiation.  Urinary bladder cystitis with bleeding, treated with cystoscopy, fulguration 2013.  Non-melanoma skin cancers.  Degenerative spine disease, spinal stenosis.  L1 compression fracture in early 10/2017.  Mild fatty liver noted on CT scan 2004. 08/2001 Colonoscopy.  Initial, routine screening study.  Diminutive polyp (adenomatous) in ascending colon removed.  05/2004 Esophagram.  Small, sliding hiatal hernia.  Minor GE reflux 01/2008 colonoscopy for adenomatous polyp surveillance.  three, 2 - 3 mm polyps (tubular adenomas) removed from cecum and transverse colon. 03/2013 Colonoscopy.  For adenomatous polyp surveillance.  Diminutive transverse colon polyp (TA) removed.  Otherwise normal study. 03/2016 EGD.  For dysphagia.  Mildly severe reflux esophagitis, otherwise normal study.  No strictures. 10/09/2017 Colonoscopy.  For rectal bleeding and hx adenomatous polyps.  Two, 1 to 2 mm, polyps (TAs) removed from rectum and descending colon.  Internal hemorrhoids.  Pt admitted 5/8 - 12/23/17, discharged to SNF.  Was seen in consultation by Dr. Silverio Decamp regarding a new diagnosis of cirrhosis due to alcohol abuse and acute cholecystitis with imaging showing dilated gallbladder, gallbladder sludge, gallbladder wall thickening and pericholecystic edema, cirrhosis and small amount of ascites.  At [facility] radiologist mentioned that the pericholecystic edema and gallbladder wall thickening  was more prominent than typically seen with cirrhosis and could not exclude acute cholecystitis, CBD 5 mm.   Due to high surgical risk, surgery recommended percutaneous cholecystostomy tube which was placed by IR 12/19/2017.  Family had noted recent jaundice.  Total bilirubin 5.4, alkaline phosphatase 196, AST/ALT 105/64.  Lipase 121.  Meld score 24.  EtOH cessation recommended.   Patient also complained of dysphagia starting ~ 2 weeks PTA.  MBSS swallowing evaluation 5/13 demonstrated mild oral pharyngeal dysphagia with bony protrusions in the cervical spine at least from level C2-C6.  This protruded onto the posterior pharyngeal wall at the level of epiglottis and impacted upper esophageal sphincter opening.  Initially the patient was started on full liquid diet.  After discharge he was advanced to regular food and thin liquids.  About 3 days ago he started having trouble with swallowing and cough/gagging with solids.  The nursing home resorted to thickening his liquids but he still was not tolerating solids.  This has progressed.  There was suspicion of pneumonia and he was transported to the ED where CT chest confirms bilateral airspace disease greater on the right.   The liver remains cirrhotic with a small to moderate amount of perihepatic ascites.  Cholecystotomy tube in position. CT scan of the neck shows vascular disease involving the right ICA, right and left vertebral arteries.  Spondylosis with bulky spurring at levels C5 and 6 which encroach on  the hypopharynx. Patient has not had fevers.  He has had mild tachycardia into the 1teens and blood pressures as low as 92/45.  Oxygen saturations with oxygen in place ranging from 93-98%. WBCs 15.9 with increased bands.  + AKI.   BNP 448, was 330 on 12/17/2017. T bili 2.9.  Alkaline phosphatase 126.  AST/ALT 39/13.  Reflecting overall improvement in his LFTs. Elevated lactic acid has resolved with hydration.  The cholecystostomy tube seems to be working.   He has had continuous output of brown, bilious material.  Volume of output is not known.    Past Medical History:  Diagnosis Date  . Arthritis   . AV block 05/13 ecg   1st degree  . Bleeding    bladder,radiation cystitis-s/p cystoscopy, clot evacuation, fulgeration of bleeders  . Cancer ([place])    prostate, hx of bsal cell skin cancer   . Cataract   . Gout   . Hematuria   . Hemorrhoid   . History of basal cell carcinoma excision 08-29-2011  left frontal scalp  . History of gout PER PT -  STABLE  . History of prostate cancer 2004   -  RX EXTERNAL RADIATION  . Hx of subdural hematoma 08-14-2011    s/p total left hip  12-92012---  resolved w/ no residual  . Hyperlipidemia   . Hypertension   . IFG (impaired fasting glucose)   . Lab findings of hypoxemia    abnormal ONO , dr perini. 05-26-2012  . Microalbuminuria 2011  . OA (osteoarthritis) of hip   . Obstructive apnea    AHI of 35 on 01-14-13 , titrated to 6 cm H20  . Pneumonia    hx of 2015   . Shoulder impingement syndrome    right  . Sleep apnea    cpap nightly  . Sleep apnea with use of continuous positive airway pressure (CPAP) 03/19/2013    Past Surgical History:  Procedure Laterality Date  . CARDIAC CATHETERIZATION     pt denies  . COLONOSCOPY    . CYSTOSCOPY WITH BIOPSY  07/03/2012   Procedure: CYSTOSCOPY WITH BIOPSY;  Surgeon: Franchot Gallo, MD;  Location: [facility];  Service: Urology;  Laterality: N/A;      . CYSTOSCOPY WITH FULGERATION N/A 12/02/2016   Procedure: CYSTOSCOPY WITH FULGERATION OF BLEEDERS AND TRANSURETRAL INCISION OF PROSTATE;  Surgeon: Franchot Gallo, MD;  Location: [facility];  Service: Urology;  Laterality: N/A;  . EYE SURGERY  2009   bilateral cataract surgery   . IR PERC CHOLECYSTOSTOMY  12/19/2017  . JOINT REPLACEMENT    . MOHS SURGERY    . PROSTATE SURGERY    . RIGHT ELBOW SURGERY  MAR 2013  . right elbow surgery  2009   ELBOW AND WRIST SURGERY  . TONSILLECTOMY     AGE  11  . TOTAL HIP ARTHROPLASTY  07/31/2011   Procedure: TOTAL HIP ARTHROPLASTY;  Surgeon: Dione Plover Aluisio;  Location: [facility];  Service: Orthopedics;  Laterality: Left;  . TOTAL HIP ARTHROPLASTY    . TRANSURETHRAL RESECTION OF PROSTATE  07/25/2012   Procedure: TRANSURETHRAL RESECTION OF THE PROSTATE WITH GYRUS INSTRUMENTS;  Surgeon: Alexis Frock, MD;  Location: [facility];  Service: Urology;  Laterality: N/A;  CYSTOSCOPY;CLOT EVACUATION WITH FULGERATION  . ULNAR NERVE REPAIR  04/13    Prior to Admission medications   Medication Sig Start Date End Date Taking? Authorizing Provider  atorvastatin (LIPITOR) 40 MG tablet Take 40 mg by mouth daily.  Yes [provider]  calcium-vitamin D (OSCAL WITH D) 500-200 MG-UNIT tablet Take 1 tablet by mouth.   Yes [provider]  cetirizine (ZYRTEC) 10 MG tablet Take 10 mg by mouth daily.   Yes [provider]  cholecalciferol (VITAMIN D) 1000 units tablet Take 1,000 Units by mouth daily.   Yes [provider]  Febuxostat 80 MG TABS Take 80 mg by mouth daily.    Yes [provider]  fluticasone (FLONASE) 50 MCG/ACT nasal spray Place 2 sprays into the nose daily. ALLERGIES   Yes [provider]  lactulose (CHRONULAC) 10 GM/15ML solution Take 15 mLs (10 g total) by mouth daily. 12/24/17  Yes Lavina Hamman, MD  magnesium hydroxide (MILK OF MAGNESIA) 400 MG/5ML suspension Take 30 mLs by mouth daily as needed for mild constipation.   Yes [provider]  metoprolol tartrate (LOPRESSOR) 25 MG tablet Take 1 tablet (25 mg total) by mouth 2 (two) times daily. 12/23/17  Yes Lavina Hamman, MD  Multiple Vitamins-Minerals (MULTIVITAMINS THER. W/MINERALS) TABS Take 1 tablet by mouth daily.    Yes [provider]  omeprazole (PRILOSEC) 40 MG capsule Take 1 capsule (40 mg total) by mouth daily. 09/04/17  Yes Zehr, Laban Emperor, PA-C  potassium chloride 20 MEQ/15ML (10%) SOLN Take 15 mLs (20 mEq total) by mouth  daily. 12/24/17  Yes Lavina Hamman, MD  traMADol (ULTRAM) 50 MG tablet Take 50 mg by mouth every 6 (six) hours as needed for moderate pain.   Yes [provider]  TURMERIC PO Take 1,000 mg by mouth 2 (two) times daily.   Yes [provider]    Scheduled Meds: . acetylcysteine  4 mL Nebulization TID  . febuxostat  80 mg Oral Daily  . fluticasone  2 spray Each Nare Daily  . heparin  5,000 Units Subcutaneous Q8H  . ipratropium-albuterol  3 mL Nebulization Q6H  . lactulose  10 g Oral Daily  . sodium chloride flush  3 mL Intravenous Q12H  . thiamine  100 mg Oral Daily   Or  . thiamine  100 mg Intravenous Daily   Infusions: . ampicillin-sulbactam (UNASYN) IV Stopped (12/30/17 0610)  . lactated ringers 150 mL/hr at 01/07/2018 2006  . vancomycin     PRN Meds: acetaminophen **OR** acetaminophen, albuterol, LORazepam **OR** LORazepam, ondansetron **OR** ondansetron (ZOFRAN) IV   Allergies as of 12/30/2017 - Review Complete 12/11/2017  Allergen Reaction Noted  . Allopurinol Hives and Other (See Comments) 12/29/2007  . Ampicillin Diarrhea and Nausea And Vomiting 06/26/2012    Family History  Problem Relation Age of Onset  . Stroke Father   . Cancer Sister        Breast cancer  . Colon cancer Neg Hx   . Stomach cancer Neg Hx   . Esophageal cancer Neg Hx     Social History   Socioeconomic History  . Marital status: Married    Spouse name: [name]  . Number of children: 2  . Years of education: Not on file  . Highest education level: Not on file  Occupational History  . Occupation: Paediatric nurse  Social Needs  . Financial resource strain: Not on file  . Food insecurity:    Worry: Not on file    Inability: Not on file  . Transportation needs:    Medical: Not on file    Non-medical: Not on file  Tobacco Use  . Smoking status: Never Smoker  . Smokeless tobacco: Never Used  Substance and Sexual Activity  . Alcohol use: Yes    Alcohol/week: 15.0 oz     Types: 4 Glasses of wine, 21 Shots of liquor per week    Comment: 4 drinks per day , all in whiskey category, 1/2 glass of wine at dinner   . Drug use: No  . Sexual activity: Not on file  Lifestyle  . Physical activity:    Days per week: Not on file    Minutes per session: Not on file  . Stress: Not on file  Relationships  . Social connections:    Talks on phone: Not on file    Gets together: Not on file    Attends religious service: Not on file    Active member of club or organization: Not on file    Attends meetings of clubs or organizations: Not on file    Relationship status: Not on file  . Intimate partner violence:    Fear of current or ex partner: Not on file    Emotionally abused: Not on file    Physically abused: Not on file    Forced sexual activity: Not on file  Other Topics Concern  . Not on file  Social History Narrative  . Not on file    REVIEW OF SYSTEMS: Constitutional: Weakness.  Up until the last few days the patient had been progressing in terms of occupational and physical therapy.  He was walking independently with the assistance of a walker. ENT:  No nose bleeds Pulm: Cough with sputum but he is finding it difficult to clear the sputum and cough it all up. CV:  No palpitations, no LE edema.  No chest pain GU:  No hematuria, no frequency GI:  Per HPI.   Heme: After Lovenox shot this morning, the patient had a vigorous amount of bleeding from the site. Transfusions: None Neuro:  No headaches, no peripheral tingling or numbness.   No confusion. Derm:  No itching, no rash or sores.  Endocrine:  No sweats or chills.  No polyuria or dysuria Immunization: Not queried. Travel:  None beyond local counties in last few months.    PHYSICAL EXAM: Vital signs in last 24 hours: Vitals:   12/30/17 0600 12/30/17 0700  BP: (!) 121/45 (!) 120/48  Pulse: (!) 121 (!) 107  Resp: (!) 40 (!) 35  Temp:    SpO2: 94% 96%   Wt Readings from Last 3 Encounters:    12/23/17 165 lb 12.6 oz (75.2 kg)  10/09/17 170 lb (77.1 kg)  09/04/17 170 lb 8 oz (77.3 kg)    General: Frail looking elderly gentleman who looks comfortable but ill. Head: No facial asymmetry or swelling.  Face is thin with temporal wasting. Eyes: Mild scleral icterus.  EOMI.  No conjunctival pallor Ears: Not hard of hearing. Nose: No discharge or congestion. Mouth: Mouth clear but oral MM dry.  Tongue midline. Neck: No JVD, no masses, no thyromegaly. Lungs: Wet, weak cough.  Sputum in suction canister is purulent but not bloody. Heart: RRR.  No MRG. Abdomen: Soft.  Not tender, not distended.  Cholecystostomy drain on the right, drainage is coffee colored with some fine sediment.  Do not see drainage around the cholecystostomy site.  Small amount of dried blood at the site of the Lovenox shot in the mid abdomen.   Rectal: Deferred Musc/Skeltl: No joint redness, swelling or significant deformity. Extremities: No CCE. Neurologic:   Alert.  Oriented x3.  No asterixis.  Slight tremor in  his hands when he holds them out straight in front of him.  Symmetric, mild weakness of the arms and legs.  Speech is slightly difficult to understand because he has a dry mouth and it is difficult for him to enunciate. Skin: No jaundice, no rashes. Tattoos: None Nodes: No cervical adenopathy Psych: Calm, cooperative, follows all commands.  Intake/Output from previous day: 05/20 0701 - 05/21 0700 In: 3900 [IV Piggyback:3900] Out: 50 [Drains:50] Intake/Output this shift: No intake/output data recorded.  LAB RESULTS: Recent Labs    01/02/2018 1304 12/30/17 0245  WBC 15.9* 15.8*  HGB 12.2* 11.0*  HCT 36.1* 32.7*  PLT 206 169   BMET Lab Results  Component Value Date   NA 144 12/30/2017   NA 142 12/16/2017   NA 142 12/23/2017   K 4.6 12/30/2017   K 5.0 01/04/2018   K 4.0 12/23/2017   CL 120 (H) 12/30/2017   CL 111 01/06/2018   CL 114 (H) 12/23/2017   CO2 18 (L) 12/30/2017   CO2 18 (L)  01/05/2018   CO2 21 (L) 12/23/2017   GLUCOSE 81 12/30/2017   GLUCOSE 84 01/06/2018   GLUCOSE 97 12/23/2017   BUN 35 (H) 12/30/2017   BUN 34 (H) 01/01/2018   BUN 16 12/23/2017   CREATININE 2.05 (H) 12/30/2017   CREATININE 2.38 (H) 12/22/2017   CREATININE 1.14 12/23/2017   CALCIUM 7.5 (L) 12/30/2017   CALCIUM 8.4 (L) 01/08/2018   CALCIUM 8.5 (L) 12/23/2017   LFT Recent Labs    12/12/2017 1304  PROT 5.8*  ALBUMIN 2.2*  AST 39  ALT 13*  ALKPHOS 126  BILITOT 2.9*   PT/INR Lab Results  Component Value Date   INR 1.76 12/19/2017   INR 1.72 12/18/2017   INR 1.45 12/17/2017   Hepatitis Panel No results for input(s): HEPBSAG, HCVAB, HEPAIGM, HEPBIGM in the last 72 hours. C-Diff No components found for: CDIFF Lipase     Component Value Date/Time   LIPASE 121 (H) 12/17/2017 1823    Drugs of Abuse  No results found for: LABOPIA, COCAINSCRNUR, LABBENZ, AMPHETMU, THCU, LABBARB   RADIOLOGY STUDIES: Ct Angio Neck W And/or Wo Contrast  Result Date: 12/19/2017 CLINICAL DATA:  Dysphagia, unexplained EXAM: CT ANGIOGRAPHY NECK TECHNIQUE: Multidetector CT imaging of the neck was performed using the standard protocol during bolus administration of intravenous contrast. Multiplanar CT image reconstructions and MIPs were obtained to evaluate the vascular anatomy. Carotid stenosis measurements (when applicable) are obtained utilizing NASCET criteria, using the distal internal carotid diameter as the denominator. CONTRAST:  173mL ISOVUE-370 IOPAMIDOL (ISOVUE-370) INJECTION 76% COMPARISON:  None. FINDINGS: Aortic arch: Atherosclerotic plaque. No dilatation or dissection. Three vessel branching. Right carotid system: Atheromatous wall thickening and calcification of the common carotid. Moderate to bulky plaque at the common carotid bifurcation and ICA bulb. ICA bulb stenosis measures up to 60%. Left carotid system: Atheromatous wall thickening of the common carotid. There is moderate plaque at the  ICA bulb without stenosis or ulceration. Vertebral arteries: Proximal subclavian atherosclerosis without flow limiting stenosis. There is calcified plaque at the right vertebral origin with high-grade narrowing-the lumen is not visible for measurement. The non dominant left vertebral artery is occluded proximally with V2 segment reconstitution reaching the dura. Skeleton: History of dysphagia. There is diffuse spondylosis with fairly bulky spurring at C5-6, encroaching on the hypopharynx. Other neck: No noted mass or inflammation. Upper chest: Reported on dedicated chest CT. IMPRESSION: 1. History of dysphagia with no specific vascular cause seen. 2. Cervical  carotid atherosclerosis with 60% right ICA stenosis. No flow limiting stenosis on the left. 3. High-grade atheromatous stenosis at the right vertebral origin. 4. Occlusion at the non dominant left vertebral origin with reconstitution in the neck. Both vertebral arteries are patent to the basilar. 5. Notable spondylosis with bulky spurring at C5-6 encroaching on the hypopharynx. 6. Chest CT reported separately. Electronically Signed   By: Monte Fantasia M.D.   On: 12/17/2017 14:17   Ct Angio Chest Pe W And/or Wo Contrast  Result Date: 12/13/2017 CLINICAL DATA:  Disc fascia since Saturday.  Cough. EXAM: CT ANGIOGRAPHY CHEST WITH CONTRAST TECHNIQUE: Multidetector CT imaging of the chest was performed using the standard protocol during bolus administration of intravenous contrast. Multiplanar CT image reconstructions and MIPs were obtained to evaluate the vascular anatomy. CONTRAST:  170mL ISOVUE-370 IOPAMIDOL (ISOVUE-370) INJECTION 76% COMPARISON:  Same day CXR FINDINGS: Cardiovascular: Dimensional branch pattern of the great vessels with atherosclerotic origins. Moderate aortic atherosclerosis without aneurysm. Satisfactory opacification of the pulmonary arteries to the segmental level without acute pulmonary embolus noted. Coronary arteriosclerosis is  identified along the LAD. Mediastinum/Nodes: Small middle mediastinal lymph nodes without pathologic enlargement, the largest is right lower paratracheal measuring up to 9 mm short axis. Patent trachea and mainstem bronchi. Esophagus is unremarkable. Thyroid is normal. Lungs/Pleura: Patchy pulmonary consolidations in the lingula and both lower lobes suspicious for areas of alveolitis/pneumonitis and/or pneumonia. Subpleural areas of minimal airspace disease in both upper lobes and along the dependent aspect of left upper lobe. Small right pleural effusion and trace left. Mild peribronchial thickening to both lower lobes. Upper Abdomen: Percutaneous cholecystostomy tube noted. Small to moderate volume of perihepatic ascites. Slight surface nodularity of the liver compatible with changes of cirrhosis. No splenomegaly. Mild mesenteric edema noted of the included upper abdomen. Musculoskeletal: No acute nor suspicious osseous abnormalities. Review of the MIP images confirms the above findings. IMPRESSION: 1. Bibasilar airspace opacities compatible with atelectasis and/or minimal pneumonia, right greater than left with associated trace left and small right pleural effusions. Faint ground-glass opacities and atelectasis noted elsewhere within the lungs bilaterally. Alveolitis/pneumonitis or stigmata of mild pulmonary edema might also account for this appearance. 2. Cirrhotic liver with small to moderate volume of perihepatic ascites. Cholecystostomy tube in place. 3. No acute pulmonary embolus. Aortic Atherosclerosis (ICD10-I70.0). Electronically Signed   By: Ashley Royalty M.D.   On: 01/09/2018 14:12   Dg Chest Portable 1 View  Result Date: 12/19/2017 CLINICAL DATA:  Hypoxia EXAM: PORTABLE CHEST 1 VIEW COMPARISON:  Dec 21, 2017 FINDINGS: There is underlying fibrotic change throughout the lungs bilaterally. There is no frank edema or consolidation. Heart is upper normal in size with pulmonary vascularity within normal  limits. There is aortic atherosclerosis. No adenopathy. No bone lesions. IMPRESSION: Underlying fibrotic change bilaterally. No edema or consolidation. Heart size normal. There is aortic atherosclerosis. Aortic Atherosclerosis (ICD10-I70.0). Electronically Signed   By: Lowella Grip III M.D.   On: 12/12/2017 12:03     IMPRESSION:   *    Dysphagia.  On modified barium swallow study as well as CT of the neck this appears to be secondary to bone spurring associated with cervical spine disease.  Ideally we would get a barium esophagram but this is too risky given his aspiration.  Would he benefit from EGD and possible esophageal dilatation?     *   Aspiration pneumonia.  HCAP.   On Vanc, Zosyn.  Associated sepsis.    *    Acute cholecystitis.  Status post per cholecystostomy tube 05/02/18  *   Alcoholic cirrhosis.      PLAN:     *   Dr Lyndel Safe to see pt and decide about EGD, if done will be safest to wait until his PNA improves.   Keep NPO for now.     Azucena Freed  12/30/2017, 9:15 AM Phone [phone]   Attending physician's note   I have taken an interval history, reviewed the chart and examined the patient. I agree with the Advanced Practitioner's note, impression and recommendations.  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  Has alcoholic liver cirrhosis, acute cholecystitis status post cholecystostomy tube 12/19/2017 as cholecystectomy was a very high risk. MBS and CT neck shows significant cervical spine bone spurring, has underlying motility disorder as well.  Had negative EGD 03/2016 for dysphagia. Not sure if he would benefit for EGD with esophageal dilatation, but family would like to try that.  At this time, he is not a candidate for sedation until resp status/PNA improves.  Will follow along.  Discussed extensively with the family.  Carmell Austria, MD

## 2017-12-30 NOTE — Progress Notes (Addendum)
PROGRESS NOTE        PATIENT DETAILS Name: Lance Martin Age: 84 y.o. Sex: male Date of Birth: October 02, 1932 Admit Date: 12/16/2017 Admitting Physician No admitting provider for patient encounter. 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  Thought to have acute hypoxemic respiratory failure secondary to aspiration pneumonia-started on broad-spectrum antibiotics and admitted to the hospitalist service.  See below for further details  Subjective: Coughing continuously-gurgling-significant amount of mucus suctioned by respiratory therapist this morning.   Assessment/Plan: Sepsis with acute hypoxic respiratory secondary to aspiration pneumonitis: Although hemodynamically stable-he is very tenuous-he has significant amount of frailty at baseline.  Keep n.p.o.-continue empiric antimicrobial therapy.  Have added bronchodilators and nebulized Mucomyst as well, respiratory therapist to perform pulmonary toileting/suctioning as needed-start flutter valve/vibrator vest.Await culture data.  If he deteriorates-we will need to talk to family about comfort measures.  Dysphagia: This was present during his most recent hospitalization as well.  Dysphagia is both to solids and liquids-per patient-he instantly regurgitates/brings up liquids and solid food.  Imaging studies so far suggest bone spur from cervical spine disease pushing on the hypopharynx.  He is at significant risk of aspiration-hence hesitant to proceed with a formal barium esophagogram.  Keep n.p.o.-have consulted GI.  But given his overall tenuous condition-he may well be served with hospice/palliative care if no endoscopic options are available.  Hence  have consulted palliative care as well.  Acute kidney injury: Likely hemodynamically mediated-in the setting of sepsis.  Improving with gentle hydration  Recent history of acute cholecystitis status post cholecystostomy tube placement: Abdominal exam benign-'s continue Unasyn and follow.  Cirrhosis: Appears stable-follow for now  Mild oozing/bleeding from anterior abdominal wall at site of heparin injection.  Compression dressings-and follow  OSA: Continue to hold CPAP due to accumulation of significant secretions.  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  Await GI evaluation to see if he is a candidate for endoscopic procedures-if he is felt not to be a candidate for endoscopic procedures then we may need to talk about hospice/palliative measures.  Awaiting input by palliative care team as well  DVT Prophylaxis: Prophylactic Heparin  Code Status:  DNR  Family Communication: Granddaughter at bedside  Disposition Plan: Remain inpatient-require several days of hospitalization before consideration of discharge  Antimicrobial agents: Anti-infectives (From admission, onward)   Start     Dose/Rate Route Frequency Ordered Stop   12/30/17 1600  vancomycin (VANCOCIN) IVPB 750 mg/150 ml premix     750 mg 150 mL/hr over 60 Minutes Intravenous Every 24 hours 01/01/2018 1441     12/30/17 0300  Ampicillin-Sulbactam (UNASYN) 3 g in sodium chloride 0.9 % 100 mL IVPB     3 g 200 mL/hr over 30 Minutes Intravenous Every 12 hours 12/24/2017 1644     12/14/2017 1430  piperacillin-tazobactam (ZOSYN) IVPB 2.25 g  Status:  Discontinued     2.25 g 100 mL/hr over 30 Minutes Intravenous  Once 12/23/2017 1421 01/06/2018 1425   12/22/2017 1430  piperacillin-tazobactam (ZOSYN) IVPB 3.375 g     3.375 g 100  mL/hr over 30 Minutes Intravenous  Once 12/15/2017 1426  01/09/2018 1531   12/27/2017 1430  vancomycin (VANCOCIN) 1,500 mg in sodium chloride 0.9 % 500 mL IVPB     1,500 mg 250 mL/hr over 120 Minutes Intravenous  Once 12/21/2017 1426 12/15/2017 1701      Procedures: None  CONSULTS:  GI and Palliative care  Time spent: 35 minutes-Greater than 50% of this time was spent in counseling, explanation of diagnosis, planning of further management, and coordination of care.  MEDICATIONS: Scheduled Meds: . acetylcysteine  4 mL Nebulization TID  . febuxostat  80 mg Oral Daily  . fluticasone  2 spray Each Nare Daily  . heparin  5,000 Units Subcutaneous Q8H  . ipratropium-albuterol  3 mL Nebulization Q6H  . lactulose  10 g Oral Daily  . sodium chloride flush  3 mL Intravenous Q12H  . thiamine  100 mg Oral Daily   Or  . thiamine  100 mg Intravenous Daily   Continuous Infusions: . ampicillin-sulbactam (UNASYN) IV Stopped (12/30/17 0610)  . lactated ringers 150 mL/hr at 12/27/2017 2006  . vancomycin     PRN Meds:.acetaminophen **OR** acetaminophen, albuterol, LORazepam **OR** LORazepam, ondansetron **OR** ondansetron (ZOFRAN) IV   PHYSICAL EXAM: Vital signs: Vitals:   12/30/17 0500 12/30/17 0600 12/30/17 0700 12/30/17 0745  BP: (!) 117/50 (!) 121/45 (!) 120/48 127/62  Pulse: (!) 114 (!) 121 (!) 107 (!) 117  Resp: (!) 34 (!) 40 (!) 35 (!) 21  Temp:      TempSrc:      SpO2: 96% 94% 96% 93%   There were no vitals filed for this visit. There is no height or weight on file to calculate BMI.   General appearance :Awake, alert, very frail appearing and deconditioned. HEENT: Atraumatic and Normocephalic Neck: supple Resp:Good air entry bilaterally, coarse rales and transmitted upper airway sounds all over. CVS: S1 S2 regular GI: Bowel sounds present, Non tender and not distended with no gaurding, rigidity or rebound.No organomegaly.  Small oozing near the umbilicus-easily controlled with compression dressing Extremities: B/L Lower Ext shows no  edema, both legs are warm to touch Neurology: Generalized weakness but nonfocal Musculoskeletal:No digital cyanosis Skin:No Rash, warm and dry Wounds:N/A  I have personally reviewed following labs and imaging studies  LABORATORY DATA: CBC: Recent Labs  Lab 12/11/2017 1304 12/30/17 0245  WBC 15.9* 15.8*  NEUTROABS 13.7*  --   HGB 12.2* 11.0*  HCT 36.1* 32.7*  MCV 99.7 101.9*  PLT 206 413    Basic Metabolic Panel: Recent Labs  Lab 01/07/2018 1304 12/30/17 0245  NA 142 144  K 5.0 4.6  CL 111 120*  CO2 18* 18*  GLUCOSE 84 81  BUN 34* 35*  CREATININE 2.38* 2.05*  CALCIUM 8.4* 7.5*    GFR: Estimated Creatinine Clearance: 26.4 mL/min (A) (by C-G formula based on SCr of 2.05 mg/dL (H)).  Liver Function Tests: Recent Labs  Lab 12/26/2017 1304  AST 39  ALT 13*  ALKPHOS 126  BILITOT 2.9*  PROT 5.8*  ALBUMIN 2.2*   No results for input(s): LIPASE, AMYLASE in the last 168 hours. No results for input(s): AMMONIA in the last 168 hours.  Coagulation Profile: No results for input(s): INR, PROTIME in the last 168 hours.  Cardiac Enzymes: No results for input(s): CKTOTAL, CKMB, CKMBINDEX, TROPONINI in the last 168 hours.  BNP (last 3 results) No results for input(s): PROBNP in the last 8760 hours.  HbA1C: No results for input(s): HGBA1C in the last  72 hours.  CBG: No results for input(s): GLUCAP in the last 168 hours.  Lipid Profile: No results for input(s): CHOL, HDL, LDLCALC, TRIG, CHOLHDL, LDLDIRECT in the last 72 hours.  Thyroid Function Tests: No results for input(s): TSH, T4TOTAL, FREET4, T3FREE, THYROIDAB in the last 72 hours.  Anemia Panel: No results for input(s): VITAMINB12, FOLATE, FERRITIN, TIBC, IRON, RETICCTPCT in the last 72 hours.  Urine analysis:    Component Value Date/Time   COLORURINE AMBER (A) 12/18/2017 0025   APPEARANCEUR CLEAR 12/18/2017 0025   LABSPEC 1.015 12/18/2017 0025   PHURINE 5.0 12/18/2017 0025   GLUCOSEU NEGATIVE 12/18/2017  0025   HGBUR NEGATIVE 12/18/2017 0025   BILIRUBINUR NEGATIVE 12/18/2017 0025   KETONESUR NEGATIVE 12/18/2017 0025   PROTEINUR 100 (A) 12/18/2017 0025   UROBILINOGEN 0.2 08/14/2011 2130   NITRITE NEGATIVE 12/18/2017 0025   LEUKOCYTESUR NEGATIVE 12/18/2017 0025    Sepsis Labs: Lactic Acid, Venous    Component Value Date/Time   LATICACIDVEN 1.9 12/30/2017 0245    MICROBIOLOGY: No results found for this or any previous visit (from the past 240 hour(s)).  RADIOLOGY STUDIES/RESULTS: Dg Chest 2 View  Result Date: 12/21/2017 CLINICAL DATA:  Pleuritic chest pain for 3 days, history hypertension, prostate cancer, gout EXAM: CHEST - 2 VIEW COMPARISON:  12/20/2017 FINDINGS: Normal heart size, mediastinal contours, and pulmonary vascularity. Atherosclerotic calcifications at aortic arch. Elevation of RIGHT diaphragm. No definite acute infiltrate, pleural effusion or pneumothorax. Osseous structures unremarkable. Pigtail drainage catheter RIGHT upper quadrant. IMPRESSION: Elevation of RIGHT diaphragm, chronic. No acute abnormalities. Electronically Signed   By: Lavonia Dana M.D.   On: 12/21/2017 13:18   Dg Chest 2 View  Result Date: 12/17/2017 CLINICAL DATA:  Sepsis EXAM: CHEST - 2 VIEW COMPARISON:  01/16/2014 FINDINGS: Mild interstitial edema. Heart size and mediastinal contours are stable with minimal aortic atherosclerosis. No pulmonary consolidation. No acute osseous abnormality. IMPRESSION: Mild interstitial edema. No acute pulmonary consolidation. Aortic atherosclerosis. Electronically Signed   By: Ashley Royalty M.D.   On: 12/17/2017 23:05   Ct Angio Neck W And/or Wo Contrast  Result Date: 12/26/2017 CLINICAL DATA:  Dysphagia, unexplained EXAM: CT ANGIOGRAPHY NECK TECHNIQUE: Multidetector CT imaging of the neck was performed using the standard protocol during bolus administration of intravenous contrast. Multiplanar CT image reconstructions and MIPs were obtained to evaluate the vascular anatomy.  Carotid stenosis measurements (when applicable) are obtained utilizing NASCET criteria, using the distal internal carotid diameter as the denominator. CONTRAST:  178mL ISOVUE-370 IOPAMIDOL (ISOVUE-370) INJECTION 76% COMPARISON:  None. FINDINGS: Aortic arch: Atherosclerotic plaque. No dilatation or dissection. Three vessel branching. Right carotid system: Atheromatous wall thickening and calcification of the common carotid. Moderate to bulky plaque at the common carotid bifurcation and ICA bulb. ICA bulb stenosis measures up to 60%. Left carotid system: Atheromatous wall thickening of the common carotid. There is moderate plaque at the ICA bulb without stenosis or ulceration. Vertebral arteries: Proximal subclavian atherosclerosis without flow limiting stenosis. There is calcified plaque at the right vertebral origin with high-grade narrowing-the lumen is not visible for measurement. The non dominant left vertebral artery is occluded proximally with V2 segment reconstitution reaching the dura. Skeleton: History of dysphagia. There is diffuse spondylosis with fairly bulky spurring at C5-6, encroaching on the hypopharynx. Other neck: No noted mass or inflammation. Upper chest: Reported on dedicated chest CT. IMPRESSION: 1. History of dysphagia with no specific vascular cause seen. 2. Cervical carotid atherosclerosis with 60% right ICA stenosis. No flow limiting stenosis on the  left. 3. High-grade atheromatous stenosis at the right vertebral origin. 4. Occlusion at the non dominant left vertebral origin with reconstitution in the neck. Both vertebral arteries are patent to the basilar. 5. Notable spondylosis with bulky spurring at C5-6 encroaching on the hypopharynx. 6. Chest CT reported separately. Electronically Signed   By: Monte Fantasia M.D.   On: 12/28/2017 14:17   Ct Angio Chest Pe W And/or Wo Contrast  Result Date: 01/07/2018 CLINICAL DATA:  Disc fascia since Saturday.  Cough. EXAM: CT ANGIOGRAPHY CHEST  WITH CONTRAST TECHNIQUE: Multidetector CT imaging of the chest was performed using the standard protocol during bolus administration of intravenous contrast. Multiplanar CT image reconstructions and MIPs were obtained to evaluate the vascular anatomy. CONTRAST:  111mL ISOVUE-370 IOPAMIDOL (ISOVUE-370) INJECTION 76% COMPARISON:  Same day CXR FINDINGS: Cardiovascular: Dimensional branch pattern of the great vessels with atherosclerotic origins. Moderate aortic atherosclerosis without aneurysm. Satisfactory opacification of the pulmonary arteries to the segmental level without acute pulmonary embolus noted. Coronary arteriosclerosis is identified along the LAD. Mediastinum/Nodes: Small middle mediastinal lymph nodes without pathologic enlargement, the largest is right lower paratracheal measuring up to 9 mm short axis. Patent trachea and mainstem bronchi. Esophagus is unremarkable. Thyroid is normal. Lungs/Pleura: Patchy pulmonary consolidations in the lingula and both lower lobes suspicious for areas of alveolitis/pneumonitis and/or pneumonia. Subpleural areas of minimal airspace disease in both upper lobes and along the dependent aspect of left upper lobe. Small right pleural effusion and trace left. Mild peribronchial thickening to both lower lobes. Upper Abdomen: Percutaneous cholecystostomy tube noted. Small to moderate volume of perihepatic ascites. Slight surface nodularity of the liver compatible with changes of cirrhosis. No splenomegaly. Mild mesenteric edema noted of the included upper abdomen. Musculoskeletal: No acute nor suspicious osseous abnormalities. Review of the MIP images confirms the above findings. IMPRESSION: 1. Bibasilar airspace opacities compatible with atelectasis and/or minimal pneumonia, right greater than left with associated trace left and small right pleural effusions. Faint ground-glass opacities and atelectasis noted elsewhere within the lungs bilaterally. Alveolitis/pneumonitis or  stigmata of mild pulmonary edema might also account for this appearance. 2. Cirrhotic liver with small to moderate volume of perihepatic ascites. Cholecystostomy tube in place. 3. No acute pulmonary embolus. Aortic Atherosclerosis (ICD10-I70.0). Electronically Signed   By: Ashley Royalty M.D.   On: 12/16/2017 14:12   Mr Abdomen Mrcp Wo Contrast  Result Date: 12/18/2017 CLINICAL DATA:  Right upper quadrant pain and jaundice. Abnormal liver and gallbladder on ultrasound. EXAM: MRI ABDOMEN WITHOUT CONTRAST  (INCLUDING MRCP) TECHNIQUE: Multiplanar multisequence MR imaging of the abdomen was performed. Heavily T2-weighted images of the biliary and pancreatic ducts were obtained, and three-dimensional MRCP images were rendered by post processing. COMPARISON:  Ultrasound on 12/17/2017 FINDINGS: Lower chest: No acute findings. Hepatobiliary: Image degradation by motion artifact noted. Hepatic cirrhosis is demonstrated. No hepatic mass visualized on this unenhanced exam. The gallbladder is dilated and shows moderate diffuse wall thickening and pericholecystic edema. Sludge is also seen within the gallbladder lumen. These findings are greater than typically seen with cirrhosis, and acute cholecystitis cannot be excluded. There is no evidence of biliary ductal dilatation, with common bile duct measuring approximately 5 mm. Pancreas: No mass or inflammatory process visualized on this unenhanced exam. No evidence of pancreatic ductal dilatation. Spleen:  Within normal limits in size. Adrenals/Urinary tract: Tiny right renal cyst noted. No evidence of hydronephrosis. Stomach/Bowel: Visualized portion unremarkable. Vascular/Lymphatic: No pathologically enlarged lymph nodes identified. No evidence of abdominal aortic aneurysm. Other:  Mild ascites.  Musculoskeletal:  No suspicious bone lesions identified. IMPRESSION: Hepatic cirrhosis and mild ascites. Dilated gallbladder with sludge, moderate diffuse wall thickening and  pericholecystic edema. These findings are greater than typically seen with cirrhosis, and acute cholecystitis cannot be excluded. If clinically warranted, nuclear medicine hepatobiliary scan could be performed for further evaluation. No evidence of biliary ductal dilatation. Electronically Signed   By: Earle Gell M.D.   On: 12/18/2017 07:32   Mr 3d Recon At Scanner  Result Date: 12/18/2017 CLINICAL DATA:  Right upper quadrant pain and jaundice. Abnormal liver and gallbladder on ultrasound. EXAM: MRI ABDOMEN WITHOUT CONTRAST  (INCLUDING MRCP) TECHNIQUE: Multiplanar multisequence MR imaging of the abdomen was performed. Heavily T2-weighted images of the biliary and pancreatic ducts were obtained, and three-dimensional MRCP images were rendered by post processing. COMPARISON:  Ultrasound on 12/17/2017 FINDINGS: Lower chest: No acute findings. Hepatobiliary: Image degradation by motion artifact noted. Hepatic cirrhosis is demonstrated. No hepatic mass visualized on this unenhanced exam. The gallbladder is dilated and shows moderate diffuse wall thickening and pericholecystic edema. Sludge is also seen within the gallbladder lumen. These findings are greater than typically seen with cirrhosis, and acute cholecystitis cannot be excluded. There is no evidence of biliary ductal dilatation, with common bile duct measuring approximately 5 mm. Pancreas: No mass or inflammatory process visualized on this unenhanced exam. No evidence of pancreatic ductal dilatation. Spleen:  Within normal limits in size. Adrenals/Urinary tract: Tiny right renal cyst noted. No evidence of hydronephrosis. Stomach/Bowel: Visualized portion unremarkable. Vascular/Lymphatic: No pathologically enlarged lymph nodes identified. No evidence of abdominal aortic aneurysm. Other:  Mild ascites. Musculoskeletal:  No suspicious bone lesions identified. IMPRESSION: Hepatic cirrhosis and mild ascites. Dilated gallbladder with sludge, moderate diffuse wall  thickening and pericholecystic edema. These findings are greater than typically seen with cirrhosis, and acute cholecystitis cannot be excluded. If clinically warranted, nuclear medicine hepatobiliary scan could be performed for further evaluation. No evidence of biliary ductal dilatation. Electronically Signed   By: Earle Gell M.D.   On: 12/18/2017 07:32   Ir Perc Cholecystostomy  Result Date: 12/19/2017 INDICATION: 84 year old male with acute cholecystitis (sludge, no definite stones). He is currently a poor operative candidate in the setting of hepatic cirrhosis. He presents for percutaneous cholecystostomy tube placement. EXAM: CHOLECYSTOSTOMY MEDICATIONS: In patient currently receiving intravenous cefepime and Flagyl. No additional antibiotic prophylaxis administered. ANESTHESIA/SEDATION: Moderate (conscious) sedation was employed during this procedure. A total of Versed 1 mg and Fentanyl 50 mcg was administered intravenously. Moderate Sedation Time: 13 minutes. The patient's level of consciousness and vital signs were monitored continuously by radiology nursing throughout the procedure under my direct supervision. FLUOROSCOPY TIME:  Fluoroscopy Time: 1 minutes 0 seconds (21 mGy). COMPLICATIONS: None immediate. PROCEDURE: Informed written consent was obtained from the patient after a thorough discussion of the procedural risks, benefits and alternatives. All questions were addressed. Maximal Sterile Barrier Technique was utilized including caps, mask, sterile gowns, sterile gloves, sterile drape, hand hygiene and skin antiseptic. A timeout was performed prior to the initiation of the procedure. The right upper quadrant was interrogated with ultrasound. The right hemidiaphragm is elevated. The liver sits high in the right upper quadrant. The distended and inflamed gallbladder containing sludge was successfully identified. There is a small volume of perihepatic ascites. A suitable skin entry site was  selected and marked. Local anesthesia was attained by infiltration with 1% lidocaine. A small dermatotomy was made. Under real-time sonographic guidance, the gallbladder lumen was punctured with a 21 gauge Accustick needle along  a short transhepatic course. The 0.018 wire was then coiled in the gallbladder lumen. The needle was exchanged over the wire for the Accustick sheath which was advanced into the gallbladder. A gentle hand injection of contrast material confirms the Accustick sheath is within the gallbladder lumen. There is extensive debris within the lumen and cobblestoning of the mucosa. A Bentson wire was then coiled in the gallbladder lumen. The Accustick sheath was removed. The percutaneous tract was dilated to 10 [place] and a Cook 10.2 [place] all-purpose drainage catheter was advanced over the wire and formed in the gallbladder lumen. Aspiration was performed yielding approximately 60 mL of thick, purulent bile. A sample was sent for Gram stain and culture. The cholecystostomy tube was gently flushed and then connected to gravity bag drainage. The catheter was secured to the skin with 0 Prolene suture. The patient tolerated the procedure well. IMPRESSION: Successful placement of a percutaneous transhepatic cholecystostomy tube for the treatment of acute cholecystitis (sludge, no definite stones on imaging). PLAN: 1. Maintain tube to gravity bag drainage. 2. Given patient's underlying cirrhosis, he is unlikely to be common optimal surgical candidate in the future. 3. IR drain clinic in 4 weeks for cholangiogram through existing tube. If the cystic duct regains patency, the percutaneous drainage catheter could be removed after 6 weeks given that there is no definitive evidence of cholelithiasis. Signed, Criselda Peaches, MD [facility] Radiology Electronically Signed   By: Jacqulynn Cadet M.D.   On: 12/19/2017 16:13   Dg Chest Portable 1  View  Result Date: 12/18/2017 CLINICAL DATA:  Hypoxia EXAM: PORTABLE CHEST 1 VIEW COMPARISON:  Dec 21, 2017 FINDINGS: There is underlying fibrotic change throughout the lungs bilaterally. There is no frank edema or consolidation. Heart is upper normal in size with pulmonary vascularity within normal limits. There is aortic atherosclerosis. No adenopathy. No bone lesions. IMPRESSION: Underlying fibrotic change bilaterally. No edema or consolidation. Heart size normal. There is aortic atherosclerosis. Aortic Atherosclerosis (ICD10-I70.0). Electronically Signed   By: Lowella Grip III M.D.   On: 12/16/2017 12:03   Dg Chest Port 1 View  Result Date: 12/20/2017 CLINICAL DATA:  Cough.  History of hypertension. EXAM: PORTABLE CHEST 1 VIEW COMPARISON:  12/17/2017; 01/26/2014; 08/14/2011; ultrasound fluoroscopic guided cholecystostomy tube placement-12/19/2017 FINDINGS: Grossly unchanged cardiac silhouette and mediastinal contours with atherosclerotic plaque within thoracic aorta. The pulmonary vasculature remains indistinct with cephalization of flow. Grossly unchanged bibasilar opacities favored to represent atelectasis. No new focal airspace opacities. No pleural effusion or pneumothorax. No acute osseus abnormalities. Cholecystostomy tube overlies the right upper abdominal quadrant. IMPRESSION: Suspected mild pulmonary edema, though note, atypical infection could have a similar appearance. Clinical correlation is advised. Electronically Signed   By: Sandi Mariscal M.D.   On: 12/20/2017 14:38   Dg Swallowing Func-speech Pathology  Result Date: 12/22/2017 Objective Swallowing Evaluation: Type of Study: MBS-Modified Barium Swallow Study  Patient Details Name: KEITA VALLEY MRN: [id number] Date of Birth: 1933-08-08 Today's Date: 12/22/2017 Time: SLP Start Time (ACUTE ONLY): 1030 -SLP Stop Time (ACUTE ONLY): 1100 SLP Time Calculation (min) (ACUTE ONLY): 30 min Past Medical History: Past Medical History: Diagnosis Date  . Arthritis  . AV block 05/13 ecg  1st degree . Bleeding   bladder,radiation cystitis-s/p cystoscopy, clot evacuation, fulgeration of bleeders . Cancer ([place])   prostate, hx of bsal cell skin cancer  . Cataract  . Gout  . Hematuria  . Hemorrhoid  . History of basal cell carcinoma excision 08-29-2011  left frontal scalp . History of gout PER PT -  STABLE . History of prostate cancer 2004   -  RX EXTERNAL RADIATION . Hx of subdural hematoma 08-14-2011    s/p total left hip  [id number]---  resolved w/ no residual . Hyperlipidemia  . Hypertension  . IFG (impaired fasting glucose)  . Lab findings of hypoxemia   abnormal ONO , dr perini. 05-26-2012 . Microalbuminuria 2011 . OA (osteoarthritis) of hip  . Obstructive apnea   AHI of 35 on 01-14-13 , titrated to 6 cm H20 . Pneumonia   hx of 2015  . Shoulder impingement syndrome   right . Sleep apnea   cpap nightly . Sleep apnea with use of continuous positive airway pressure (CPAP) 03/19/2013 Past Surgical History: Past Surgical History: Procedure Laterality Date . CARDIAC CATHETERIZATION    pt denies . COLONOSCOPY   . CYSTOSCOPY WITH BIOPSY  07/03/2012  Procedure: CYSTOSCOPY WITH BIOPSY;  Surgeon: Franchot Gallo, MD;  Location: [facility];  Service: Urology;  Laterality: N/A;   . CYSTOSCOPY WITH FULGERATION N/A 12/02/2016  Procedure: CYSTOSCOPY WITH FULGERATION OF BLEEDERS AND TRANSURETRAL INCISION OF PROSTATE;  Surgeon: Franchot Gallo, MD;  Location: [facility];  Service: Urology;  Laterality: N/A; . EYE SURGERY  2009  bilateral cataract surgery  . IR PERC CHOLECYSTOSTOMY  12/19/2017 . JOINT REPLACEMENT   . MOHS SURGERY   . PROSTATE SURGERY   . RIGHT ELBOW SURGERY  MAR 2013 . right elbow surgery  2009  ELBOW AND WRIST SURGERY . TONSILLECTOMY    AGE 20 . TOTAL HIP ARTHROPLASTY  07/31/2011  Procedure: TOTAL HIP ARTHROPLASTY;  Surgeon: Dione Plover Aluisio;  Location: [facility];  Service: Orthopedics;  Laterality: Left; . TOTAL HIP ARTHROPLASTY   . TRANSURETHRAL RESECTION OF  PROSTATE  07/25/2012  Procedure: TRANSURETHRAL RESECTION OF THE PROSTATE WITH GYRUS INSTRUMENTS;  Surgeon: Alexis Frock, MD;  Location: [facility];  Service: Urology;  Laterality: N/A;  CYSTOSCOPY;CLOT EVACUATION WITH FULGERATION . ULNAR NERVE REPAIR  04/13 HPI: Pt. with PMH of HTN, HLD, gout, prostate cancer, hemorrhagic cystitis S/P fulguration, GERD; admitted on 12/17/2017, presented with complaint of abdominal pain, was found to have acute cholecystitis, s/p percutaneous cholecystostomy 12/19/17; on clears per surgery to be advanced as tolerated. Per GI pt with recent trouble swallowing with increased regurgitation prior to admission. GI recommending dysphagia w/u with esophagram, likely next week.  Subjective: Awake in bed Assessment / Plan / Recommendation CHL IP CLINICAL IMPRESSIONS 12/22/2017 Clinical Impression Pt demonstrates a mild oropharyngeal dysphagia with structural difference given  appearance of bony protrusions on anterior cervical spine from C2-C6 at least. Pt observed to independently compensate for reduced UES opening by sustaining laryngeal elevation and closure over 2-3 instances of propulsion/hyoid excursion/UES opening to fully transit bolus. Minimal thin and solid residue clings to posterior pharyngeal wall and around epiglottis right bove where it contacts with the beginning of most significant protrusion of pharyngeal wall. Pt clears most residue with two swallows and a liquid wash. Pt may resume a regular diet and thin liquids with use of strategies with f/u from SLP. Did not advance solids given GI issues. MD may advance to regular thin if pt ready  SLP Visit Diagnosis Dysphagia, unspecified (R13.10) Attention and concentration deficit following -- Frontal lobe and executive function deficit following -- Impact on safety and function Mild aspiration risk   CHL IP TREATMENT RECOMMENDATION 12/22/2017 Treatment Recommendations Therapy as outlined in treatment plan below   Prognosis 12/22/2017  Prognosis for  Safe Diet Advancement Good Barriers to Reach Goals -- Barriers/Prognosis Comment -- CHL IP DIET RECOMMENDATION 12/22/2017 SLP Diet Recommendations Regular solids;Thin liquid Liquid Administration via Cup;Straw Medication Administration Whole meds with puree Compensations Slow rate;Small sips/bites;Follow solids with liquid;Multiple dry swallows after each bite/sip Postural Changes --   CHL IP OTHER RECOMMENDATIONS 12/22/2017 Recommended Consults -- Oral Care Recommendations Oral care BID Other Recommendations --   CHL IP FOLLOW UP RECOMMENDATIONS 12/22/2017 Follow up Recommendations Skilled Nursing facility   [facility] IP FREQUENCY AND DURATION 12/22/2017 Speech Therapy Frequency (ACUTE ONLY) min 2x/week Treatment Duration 2 weeks      No flowsheet data found. No flowsheet data found. No flowsheet data found. No flowsheet data found. Herbie [place], [place] CCC-SLP [phone] [name] 12/22/2017, 2:11 PM              US Abdomen Limited Ruq  Result Date: 12/17/2017 CLINICAL DATA:  Right upper quadrant abdominal pain and jaundice. EXAM: ULTRASOUND ABDOMEN LIMITED RIGHT UPPER QUADRANT COMPARISON:  None. FINDINGS: Gallbladder: Tumefactive biliary sludge is identified within the gallbladder. The single wall thickness of the gallbladder is 4.2 mm, normal less than 3 mm. No pericholecystic fluid. No sonographic Murphy sign noted by sonographer. Common bile duct: Diameter: 5 mm Liver: No focal lesion identified given limitations due to the patient's inability to adequately position for better images. Within normal limits in parenchymal echogenicity. Portal vein is patent on color Doppler imaging though indeterminate in direction of blood flow given mixed color signal noted within. Nodular liver surface compatible with cirrhosis. Adjacent small volume of ascites. IMPRESSION: 1. Thick-walled gallbladder with biliary sludge noted within. 2. Cirrhotic appearing liver with adjacent small volume of perihepatic  ascites. Mixed color Doppler signal within the imaged portal venous system may represent early changes of portal hypertension though the portal vein is patent. Electronically Signed   By: Ashley Royalty M.D.   On: 12/17/2017 19:55     LOS: 1 day   Oren Binet, MD  [facility]  If 7PM-7AM, please contact night-coverage  Please page via [email or web address]-click on MD name and type text message  12/30/2017, 12:00 PM

## 2017-12-30 NOTE — Progress Notes (Signed)
PMT to meet with patient/daughter 5/22 at 12pm.   [place], FNP-C Palliative Medicine Team  Phone: [phone] Fax: [phone]

## 2017-12-30 NOTE — ED Notes (Signed)
Pharmacist notified on pt.'s Unasyn IV antibiotic.

## 2017-12-31 ENCOUNTER — Inpatient Hospital Stay (HOSPITAL_COMMUNITY): Payer: Medicare Other

## 2017-12-31 DIAGNOSIS — K117 Disturbances of salivary secretion: Secondary | ICD-10-CM

## 2017-12-31 DIAGNOSIS — Z515 Encounter for palliative care: Secondary | ICD-10-CM

## 2017-12-31 DIAGNOSIS — F411 Generalized anxiety disorder: Secondary | ICD-10-CM

## 2017-12-31 DIAGNOSIS — R0682 Tachypnea, not elsewhere classified: Secondary | ICD-10-CM

## 2017-12-31 DIAGNOSIS — R0603 Acute respiratory distress: Secondary | ICD-10-CM

## 2017-12-31 DIAGNOSIS — A419 Sepsis, unspecified organism: Principal | ICD-10-CM

## 2017-12-31 DIAGNOSIS — J690 Pneumonitis due to inhalation of food and vomit: Secondary | ICD-10-CM

## 2017-12-31 LAB — BASIC METABOLIC PANEL
Anion gap: 11 (ref 5–15)
BUN: 40 mg/dL — ABNORMAL HIGH (ref 6–20)
CO2: 15 mmol/L — ABNORMAL LOW (ref 22–32)
Calcium: 7.7 mg/dL — ABNORMAL LOW (ref 8.9–10.3)
Chloride: 122 mmol/L — ABNORMAL HIGH (ref 101–111)
Creatinine, Ser: 1.98 mg/dL — ABNORMAL HIGH (ref 0.61–1.24)
GFR calc Af Amer: 34 mL/min — ABNORMAL LOW (ref >60)
GFR calc non Af Amer: 30 mL/min — ABNORMAL LOW (ref >60)
Glucose, Bld: 71 mg/dL (ref 65–99)
Potassium: 4.5 mmol/L (ref 3.5–5.1)
Sodium: 148 mmol/L — ABNORMAL HIGH (ref 135–145)

## 2017-12-31 LAB — CBC
HCT: 34.3 % — ABNORMAL LOW (ref 39.0–52.0)
Hemoglobin: 11.6 g/dL — ABNORMAL LOW (ref 13.0–17.0)
MCH: 34.1 pg — ABNORMAL HIGH (ref 26.0–34.0)
MCHC: 33.8 g/dL (ref 30.0–36.0)
MCV: 100.9 fL — ABNORMAL HIGH (ref 78.0–100.0)
Platelets: 165 10*3/uL (ref 150–400)
RBC: 3.4 MIL/uL — ABNORMAL LOW (ref 4.22–5.81)
RDW: 16.3 % — ABNORMAL HIGH (ref 11.5–15.5)
WBC: 15.4 10*3/uL — ABNORMAL HIGH (ref 4.0–10.5)

## 2017-12-31 LAB — STREP PNEUMONIAE URINARY ANTIGEN: STREP PNEUMO URINARY ANTIGEN: NEGATIVE

## 2017-12-31 MED ORDER — MORPHINE BOLUS VIA INFUSION
1.0000 mg | INTRAVENOUS | Status: DC | PRN
  Filled 2017-12-31: qty 1

## 2017-12-31 MED ORDER — ONDANSETRON 4 MG PO TBDP: 4.0000 mg | ORAL_TABLET | Freq: Four times a day (QID) | ORAL | Status: DC | PRN

## 2017-12-31 MED ORDER — LORAZEPAM 1 MG PO TABS: 1.0000 mg | ORAL_TABLET | ORAL | Status: DC | PRN

## 2017-12-31 MED ORDER — LEVALBUTEROL HCL 1.25 MG/0.5ML IN NEBU: 1.2500 mg | INHALATION_SOLUTION | Freq: Once | RESPIRATORY_TRACT | Status: DC

## 2017-12-31 MED ORDER — SODIUM CHLORIDE 0.9 % IV BOLUS
500.0000 mL | Freq: Once | INTRAVENOUS | Status: AC
  Administered 2017-12-31: 500 mL via INTRAVENOUS

## 2017-12-31 MED ORDER — LORAZEPAM 2 MG/ML IJ SOLN
1.0000 mg | INTRAMUSCULAR | Status: DC | PRN
  Administered 2017-12-31: 1 mg via INTRAVENOUS
  Filled 2017-12-31: qty 1

## 2017-12-31 MED ORDER — SODIUM CHLORIDE 0.9 % IV SOLN: 250.0000 mL | INTRAVENOUS | Status: DC | PRN

## 2017-12-31 MED ORDER — HYDROMORPHONE BOLUS VIA INFUSION
1.0000 mg | INTRAVENOUS | Status: DC | PRN
  Administered 2017-12-31: 1 mg via INTRAVENOUS
  Filled 2017-12-31: qty 1

## 2017-12-31 MED ORDER — SODIUM CHLORIDE 0.9% FLUSH
3.0000 mL | Freq: Two times a day (BID) | INTRAVENOUS | Status: DC
  Administered 2017-12-31: 3 mL via INTRAVENOUS

## 2017-12-31 MED ORDER — LORAZEPAM 2 MG/ML PO CONC: 1.0000 mg | ORAL | Status: DC | PRN

## 2017-12-31 MED ORDER — LIDOCAINE HCL URETHRAL/MUCOSAL 2 % EX GEL
1.0000 "application " | Freq: Once | CUTANEOUS | Status: DC
  Filled 2017-12-31: qty 5

## 2017-12-31 MED ORDER — MORPHINE 100MG IN NS 100ML (1MG/ML) PREMIX INFUSION: 1.0000 mg/h | INTRAVENOUS | Status: DC

## 2017-12-31 MED ORDER — GLYCOPYRROLATE 0.2 MG/ML IJ SOLN: 0.2000 mg | INTRAMUSCULAR | Status: DC | PRN

## 2017-12-31 MED ORDER — HALOPERIDOL 0.5 MG PO TABS: 0.5000 mg | ORAL_TABLET | ORAL | Status: DC | PRN

## 2017-12-31 MED ORDER — BIOTENE DRY MOUTH MT LIQD: 15.0000 mL | OROMUCOSAL | Status: DC | PRN

## 2017-12-31 MED ORDER — HALOPERIDOL LACTATE 5 MG/ML IJ SOLN: 0.5000 mg | INTRAMUSCULAR | Status: DC | PRN

## 2017-12-31 MED ORDER — HYDROMORPHONE HCL 1 MG/ML IJ SOLN
1.0000 mg | INTRAMUSCULAR | Status: AC
  Administered 2017-12-31: 1 mg via INTRAVENOUS
  Filled 2017-12-31: qty 1

## 2017-12-31 MED ORDER — GLYCOPYRROLATE 0.2 MG/ML IJ SOLN
0.2000 mg | INTRAMUSCULAR | Status: DC
  Administered 2017-12-31 (×2): 0.2 mg via INTRAVENOUS
  Filled 2017-12-31 (×2): qty 1

## 2017-12-31 MED ORDER — SCOPOLAMINE 1 MG/3DAYS TD PT72
1.0000 | MEDICATED_PATCH | TRANSDERMAL | Status: DC
  Administered 2017-12-31: 1.5 mg via TRANSDERMAL
  Filled 2017-12-31: qty 1

## 2017-12-31 MED ORDER — LEVALBUTEROL HCL 0.63 MG/3ML IN NEBU: 0.6300 mg | INHALATION_SOLUTION | RESPIRATORY_TRACT | Status: DC | PRN

## 2017-12-31 MED ORDER — GLYCOPYRROLATE 0.2 MG/ML IJ SOLN
0.2000 mg | Freq: Three times a day (TID) | INTRAMUSCULAR | Status: DC
  Administered 2017-12-31: 0.2 mg via INTRAVENOUS
  Filled 2017-12-31: qty 1

## 2017-12-31 MED ORDER — SODIUM CHLORIDE 0.9 % IV SOLN
0.5000 mg/h | INTRAVENOUS | Status: DC
  Administered 2017-12-31: 0.5 mg/h via INTRAVENOUS
  Filled 2017-12-31: qty 2.5

## 2017-12-31 MED ORDER — GLYCOPYRROLATE 1 MG PO TABS: 1.0000 mg | ORAL_TABLET | ORAL | Status: DC | PRN

## 2017-12-31 MED ORDER — HALOPERIDOL LACTATE 2 MG/ML PO CONC
0.5000 mg | ORAL | Status: DC | PRN
  Filled 2017-12-31: qty 0.3

## 2017-12-31 MED ORDER — GLYCOPYRROLATE 0.2 MG/ML IJ SOLN
0.2000 mg | INTRAMUSCULAR | Status: DC | PRN
  Administered 2017-12-31: 0.2 mg via INTRAVENOUS
  Filled 2017-12-31: qty 1

## 2017-12-31 MED ORDER — ONDANSETRON HCL 4 MG/2ML IJ SOLN: 4.0000 mg | Freq: Four times a day (QID) | INTRAMUSCULAR | Status: DC | PRN

## 2017-12-31 MED ORDER — METOPROLOL TARTRATE 5 MG/5ML IV SOLN
2.5000 mg | Freq: Once | INTRAVENOUS | Status: AC
  Administered 2017-12-31: 2.5 mg via INTRAVENOUS
  Filled 2017-12-31: qty 5

## 2017-12-31 MED ORDER — DIPHENHYDRAMINE HCL 50 MG/ML IJ SOLN: 12.5000 mg | INTRAMUSCULAR | Status: DC | PRN

## 2017-12-31 MED ORDER — SODIUM CHLORIDE 0.9% FLUSH: 3.0000 mL | INTRAVENOUS | Status: DC | PRN

## 2017-12-31 MED ORDER — LIDOCAINE HCL URETHRAL/MUCOSAL 2 % EX GEL
1.0000 "application " | Freq: Once | CUTANEOUS | Status: AC
  Administered 2017-12-31: 1 via URETHRAL
  Filled 2017-12-31: qty 5

## 2018-01-03 LAB — CULTURE, BLOOD (ROUTINE X 2)
Culture: NO GROWTH
Culture: NO GROWTH
SPECIAL REQUESTS: ADEQUATE
Special Requests: ADEQUATE

## 2018-01-10 NOTE — Consult Note (Addendum)
Consultation Note Date: Jan 26, 2018   Patient Name: Lance Martin  DOB: November 19, 1932  MRN: [id number]  Age / Sex: 84 y.o., male  PCP: Crist Infante, MD Referring Physician: Jonetta Osgood, MD  Reason for Consultation: Establishing goals of care and Terminal Care  HPI/Patient Profile: 84 y.o. male  with past medical history of liver cirrhosis, hypertension, hyperlipidemia, OSA with CPAP, osteoarthritis, subdural hematoma, gout, and prostate cancer admitted on 12/25/2017 with shortness of breath, cough, and dysphagia. Recently hospitalized for acute cholecystitis s/p percutaneous cystostomy tube placement (high risk for surgery with underlying cirrhosis) and worsening dysphagia. Discharged to SNF for rehab. In ED, found to have acute hypoxemic respiratory failure secondary to sepsis from aspiration pneumonia. Started on broad-spectrum antibiotics. On 5/22 AM, patient with worsening clinical status due to lethargy, tachypnea/accessory muscle use, and audible secretions. Palliative medicine consultation for goals of care/terminal care.   Clinical Assessment and Goals of Care: Palliative meeting was scheduled for 12pm today with daughter. Received call from Dr. Sloan Leiter this morning with update that patient has declined and actively dying. Dr. Sloan Leiter spoke with daughter via telephone to update and transition to comfort measures.   Upon arrival to room, daughter Lance Martin) and granddaughter Lance Martin) at bedside. Patient appears to be in distress with agonal/tachypnic respirations and accessory muscle usage. Also with audible secretions. Introduced role of palliative medicine to family.   Family understands he is actively dying. First discussed goal of comfort and dignity at EOL. Educated on medications to ensure comfort including initiating a continuous dilaudid infusion for dyspnea/tachypnea. Stayed with family at bedside and  instructed RN to give Dilaudid 1mg  stat until dilaudid infusion arrived to unit. Also instructed RN to give Ativan 1mg  IV and Robinul 0.2mg  IV. During my visit, RN initiated continuous dilaudid infusion with bolus via infusion at initiation. Scopolamine patch placed.  We discussed a brief life review of the patient. Retired Paediatric nurse. Wife (of 57 years) passed away 6 months ago from sepsis. They have two adult children. Lance Martin shares that Dr. Rosana Martin was home living independently and caring for son up until hospitalization 3 weeks ago. Newly diagnosed with cirrhosis. Last hospitalization, patient was septic and with dysphagia requiring SLP evaluation. He was discharged to SNF for rehab and was doing better, up until three days ago.    Discussed hospital diagnoses and interventions.   Lance Martin and Lance Martin are very tearful and somewhat surprised that this has all happened so fast. Lance Martin is emotionally distressed with the thought of her brother (who suffers from psychological diagnoses) and that he will be alone. Dr. Rosana Martin has cared for him for many years. I encouraged Lance Martin to consider bereavement support services through hospice.   Educated on comfort focused care with goal of comfort and dignity at EOL. Educated on EOL expectations and medications for symptom management. Family understands interventions not aimed at comfort will be discontinued.   Educated on prognosis of likely hours-days. Family confirms understanding.   Questions and concerns were addressed. Emotional/spiritual support provided. Therapeutic listening as [place] and  Lance Martin share many stories of Dr. Rosana Martin and his wife.    SUMMARY OF RECOMMENDATIONS    Patient actively dying. Discussed with family. Transition to comfort measures only. Discontinued interventions not aimed at comfort.   DNR/DNI  Symptom management--see below.   Spiritual consult for EOL care/prayer.  Family understands prognosis of likely hours-days.    Anticipate hospital death. Will further discuss hospice services if appropriate.   Code Status/Advance Care Planning:  DNR  Symptom Management:   Dilaudid 0.5mg /hr continuous infusion (Will avoid Morphine with acute kidney injury).  RN may bolus dilaudid via infusion 1mg  q81min prn pain/dyspnea/air hunger  Robinul 0.2mg  IV q4h scheduled  Robinul 0.2mg  IV q4h prn secretions  Scopolamine patch  Ativan 1mg  IV q4h prn anxiety/agitation  Palliative Prophylaxis:   Aspiration, Delirium Protocol, Frequent Pain Assessment, Oral Care and Turn Reposition  Additional Recommendations (Limitations, Scope, Preferences):  Full Comfort Care  Psycho-social/Spiritual:   Desire for further Chaplaincy support:yes  Additional Recommendations: Caregiving  Support/Resources and Education on Hospice  Prognosis:   Hours - Days  Discharge Planning: Anticipated Hospital Death      Primary Diagnoses: Present on Admission: . Aspiration pneumonia ([place]) . Sepsis ([place]) . (Resolved) Obstructive sleep apnea (adult) (pediatric) . Sleep apnea with use of continuous positive airway pressure (CPAP) . Alcohol abuse . Acute acalculous cholecystitis . Alcoholic cirrhosis of liver without ascites ([place])   I have reviewed the medical record, interviewed the patient and family, and examined the patient. The following aspects are pertinent.  Past Medical History:  Diagnosis Date  . Arthritis   . AV block 05/13 ecg   1st degree  . Bleeding    bladder,radiation cystitis-s/p cystoscopy, clot evacuation, fulgeration of bleeders  . Cancer ([place])    prostate, hx of bsal cell skin cancer   . Cataract   . Gout   . Hematuria   . Hemorrhoid   . History of basal cell carcinoma excision 08-29-2011  left frontal scalp  . History of gout PER PT -  STABLE  . History of prostate cancer 2004   -  RX EXTERNAL RADIATION  . Hx of subdural hematoma 08-14-2011    s/p total left hip  12-92012---  resolved w/ no  residual  . Hyperlipidemia   . Hypertension   . IFG (impaired fasting glucose)   . Lab findings of hypoxemia    abnormal ONO , dr perini. 05-26-2012  . Microalbuminuria 2011  . OA (osteoarthritis) of hip   . Obstructive apnea    AHI of 35 on 01-14-13 , titrated to 6 cm H20  . Pneumonia    hx of 2015   . Shoulder impingement syndrome    right  . Sleep apnea    cpap nightly  . Sleep apnea with use of continuous positive airway pressure (CPAP) 03/19/2013   Social History   Socioeconomic History  . Marital status: Married    Spouse name: [name]  . Number of children: 2  . Years of education: Not on file  . Highest education level: Not on file  Occupational History  . Occupation: Paediatric nurse  Social Needs  . Financial resource strain: Not on file  . Food insecurity:    Worry: Not on file    Inability: Not on file  . Transportation needs:    Medical: Not on file    Non-medical: Not on file  Tobacco Use  . Smoking status: Never Smoker  . Smokeless tobacco: Never Used  Substance and Sexual Activity  .  Alcohol use: Yes    Alcohol/week: 15.0 oz    Types: 4 Glasses of wine, 21 Shots of liquor per week    Comment: 4 drinks per day , all in whiskey category, 1/2 glass of wine at dinner   . Drug use: No  . Sexual activity: Not on file  Lifestyle  . Physical activity:    Days per week: Not on file    Minutes per session: Not on file  . Stress: Not on file  Relationships  . Social connections:    Talks on phone: Not on file    Gets together: Not on file    Attends religious service: Not on file    Active member of club or organization: Not on file    Attends meetings of clubs or organizations: Not on file    Relationship status: Not on file  Other Topics Concern  . Not on file  Social History Narrative  . Not on file   Family History  Problem Relation Age of Onset  . Stroke Father   . Cancer Sister        Breast cancer  . Colon cancer Neg Hx   . Stomach cancer Neg  Hx   . Esophageal cancer Neg Hx    Scheduled Meds: . glycopyrrolate  0.2 mg Intravenous Q4H  . scopolamine  1 patch Transdermal Q72H  . sodium chloride flush  3 mL Intravenous Q12H   Continuous Infusions: . sodium chloride    . HYDROmorphone 0.5 mg/hr (Jan 24, 2018 0951)   PRN Meds:.sodium chloride, acetaminophen **OR** acetaminophen, antiseptic oral rinse, diphenhydrAMINE, glycopyrrolate **OR** glycopyrrolate **OR** glycopyrrolate, haloperidol **OR** haloperidol **OR** haloperidol lactate, HYDROmorphone, LORazepam **OR** LORazepam **OR** LORazepam, ondansetron **OR** ondansetron (ZOFRAN) IV, sodium chloride flush Medications Prior to Admission:  Prior to Admission medications   Medication Sig Start Date End Date Taking? Authorizing Provider  atorvastatin (LIPITOR) 40 MG tablet Take 40 mg by mouth daily.   Yes [provider]  calcium-vitamin D (OSCAL WITH D) 500-200 MG-UNIT tablet Take 1 tablet by mouth.   Yes [provider]  cetirizine (ZYRTEC) 10 MG tablet Take 10 mg by mouth daily.   Yes [provider]  cholecalciferol (VITAMIN D) 1000 units tablet Take 1,000 Units by mouth daily.   Yes [provider]  Febuxostat 80 MG TABS Take 80 mg by mouth daily.    Yes [provider]  fluticasone (FLONASE) 50 MCG/ACT nasal spray Place 2 sprays into the nose daily. ALLERGIES   Yes [provider]  lactulose (CHRONULAC) 10 GM/15ML solution Take 15 mLs (10 g total) by mouth daily. 12/24/17  Yes Lavina Hamman, MD  magnesium hydroxide (MILK OF MAGNESIA) 400 MG/5ML suspension Take 30 mLs by mouth daily as needed for mild constipation.   Yes [provider]  metoprolol tartrate (LOPRESSOR) 25 MG tablet Take 1 tablet (25 mg total) by mouth 2 (two) times daily. 12/23/17  Yes Lavina Hamman, MD  Multiple Vitamins-Minerals (MULTIVITAMINS THER. W/MINERALS) TABS Take 1 tablet by mouth daily.    Yes [provider]  omeprazole (PRILOSEC) 40  MG capsule Take 1 capsule (40 mg total) by mouth daily. 09/04/17  Yes Zehr, Laban Emperor, PA-C  potassium chloride 20 MEQ/15ML (10%) SOLN Take 15 mLs (20 mEq total) by mouth daily. 12/24/17  Yes Lavina Hamman, MD  traMADol (ULTRAM) 50 MG tablet Take 50 mg by mouth every 6 (six) hours as needed for moderate pain.   Yes [provider]  TURMERIC PO Take 1,000 mg by mouth 2 (two) times daily.   Yes [provider]   Allergies  Allergen Reactions  . Allopurinol Hives and Other (See Comments)    Cause renal failure   . Ampicillin Diarrhea and Nausea And Vomiting   Review of Systems  Unable to perform ROS: Acuity of condition   Physical Exam  Constitutional: He appears lethargic. He appears ill. He appears distressed.  HENT:  Head: Normocephalic and atraumatic.  Cardiovascular: Tachycardia present.  Pulmonary/Chest: Accessory muscle usage present. Tachypnea noted. He is in respiratory distress. He has rhonchi.  RN initiating dilaudid gtt  Neurological: He appears lethargic.  Skin: Skin is warm and dry.  Psychiatric: He is noncommunicative. He is inattentive.   Vital Signs: BP (!) 106/58 (BP Location: Left Arm)   Pulse (!) 136   Temp 100 F (37.8 C) (Axillary)   Resp (!) 25   Ht 5\' 11"  (1.803 m)   Wt 78.5 kg (173 lb 1 oz)   SpO2 90%   BMI 24.14 kg/m  Pain Scale: 0-10   Pain Score: Asleep   SpO2: SpO2: 90 % O2 Device:SpO2: 90 % O2 Flow Rate: .O2 Flow Rate (L/min): 3.5 L/min  IO: Intake/output summary:   Intake/Output Summary (Last 24 hours) at 01/22/18 1051 Last data filed at 22-Jan-2018 0830 Gross per 24 hour  Intake 6310 ml  Output 425 ml  Net 5885 ml    LBM: Last BM Date: 01-22-18 Baseline Weight: Weight: 76.7 kg (169 lb 1.5 oz) Most recent weight: Weight: 78.5 kg (173 lb 1 oz)     Palliative Assessment/Data: PPS 10%   Flowsheet Rows     Most Recent Value  Intake Tab  Referral Department  Hospitalist  Palliative Care Primary Diagnosis   Pulmonary  Palliative Care Type  New Palliative care  Reason for referral  Clarify Goals of Care, End of Life Care Assistance  Date first seen by Palliative Care  01-22-18  Clinical Assessment  Palliative Performance Scale Score  10%  Psychosocial & Spiritual Assessment  Palliative Care Outcomes  Patient/Family meeting held?  Yes  Who was at the meeting?  daughter, granddaughter  Palliative Care Outcomes  Clarified goals of care, Provided end of life care assistance, ACP counseling assistance, Improved pain interventions, Improved non-pain symptom therapy, Provided psychosocial or spiritual support, Changed to focus on comfort      Time In: 0900 Time Out: 1030 Time Total: 90 min Greater than 50%  of this time was spent counseling and coordinating care related to the above assessment and plan.  Signed by:  Ihor Dow, FNP-C Palliative Medicine Team  Phone: [phone] Fax: [phone]   Please contact Palliative Medicine Team phone at [phone] for questions and concerns.  For individual provider: See Shea Evans

## 2018-01-10 NOTE — Progress Notes (Signed)
Dilaudid gtt initiated with bolus dose administered per order. Patient appears comfortable resting in bed with family present at the bedside. Palliative team has been in speaking with the family. Will continue to monitor.

## 2018-01-10 NOTE — Progress Notes (Signed)
Patients alert with confusion to place and situation. HR noted 140's and RR in the 30's. Notified MD of abnormal VS. MD states patient has poor prognosis and palliative has been consulted. Patient has audible crackles noted both upper and lower lobes. Administered ordered robinul for secretions and ativan for restlessness and anxiety. Nightshift nurse reports urinary retention and inability to insert foley catheter, states resistance was met. Notified MD who ordered Coude catheter insertion. Will continue to monitor.

## 2018-01-10 NOTE — Progress Notes (Addendum)
Unable to place foley catheter.  Schorr, NP notified.  Patient retaining > 300 ml of urine . Heart rate has remained elevated > 130 thorough the night   Patient also having increased work of breathing with audible gurgling

## 2018-01-10 NOTE — Progress Notes (Signed)
Patient TOD 01/15/18 @ 1737. Daughter present at the bedside. MD notified and will sign death certificate.

## 2018-01-10 NOTE — Death Summary Note (Signed)
DEATH SUMMARY   Patient Details  Name: Lance Martin MRN: [id number] DOB: 11-Oct-1932  Admission/Discharge Information   Admit Date:  01/18/18  Date of Death: Date of Death: 2018-01-20  Time of Death: Time of Death: 11-28-35  Length of Stay: 2  Referring Physician: Crist Infante, MD   Reason(s) for Hospitalization  Acute hypoxic respiratory failure due to aspiration pneumonia  Diagnoses  Preliminary cause of death:  Secondary Diagnoses (including complications and co-morbidities):  Principal Problem:   Sepsis ([place]) Active Problems:   Sleep apnea with use of continuous positive airway pressure (CPAP)   Alcohol abuse   Acute acalculous cholecystitis   Alcoholic cirrhosis of liver without ascites ([place])   Aspiration pneumonia ([place])   Acute hypoxemic respiratory failure ([place])   AKI (acute kidney injury) ([place])   Dysphagia   Palliative care by specialist   Terminal care   Acute respiratory distress   Tachypnea   Anxiety state   Increased oropharyngeal secretions   Brief Hospital Course (including significant findings, care, treatment, and services provided and events leading to death)  Brief Narrative: 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  Thought to have acute hypoxemic respiratory failure secondary to aspiration pneumonia-started on broad-spectrum antibiotics and admitted to the hospitalist service.  Unfortunately, he continued to deteriorate-after speaking with the patient's family-patient was transition to full comfort measures and subsequently expired on 2023/01/21. See below for further details  Hospital course by problem list: Sepsis with acute hypoxic respiratory secondary to aspiration pneumonitis:  Aspiration pneumonia was secondary to severe  dysphagia.  Admitted to the stepdown unit-started on oxygen-broad-spectrum antimicrobial therapy and kept n.p.o.  Given his tenuous overall state-chronic frailty-he was not felt to be a endoscopic candidate to evaluate his dysphagia.  After speaking with the patient's daughter over the phone on 5/22-he was transitioned to full comfort measures.  Patient was also followed by the palliative care team.  Dysphagia: This was present during his most recent hospitalization as well.  Dysphagia was both to solids and liquids-per patient-he instantly regurgitates/brings up liquids and solid food.  Imaging studies so far suggest bone spur from cervical spine disease pushing on the hypopharynx.  He probably had underlying motility disorder as well.  Gastroenterology was consulted, given his overall frailty and his tenuous respiratory state, it was felt that he will not be able to tolerate any sort of sedation for endoscopic evaluation.  As noted above, he worsen post admission-and after discussion with family by this MD and with the palliative care team-he was transition to full comfort measures.  Acute kidney injury: Likely hemodynamically mediated-in the setting of sepsis.  Improved with gentle hydration.    Hypernatremia: Secondary to poor oral intake  Recent history of acute cholecystitis status post cholecystostomy tube placement: Abdominal exam benign-was briefly on broad-spectrum antimicrobial therapy for pneumonia.  Cirrhosis: .  Stable  Mild oozing/bleeding from anterior abdominal wall at site of heparin injection.    Resolved with compression dressing.  OSA: CPAP was held due to accumulation of significant secretions.  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  Evaluated by gastroenterology-not a candidate for endoscopic procedures given overall  frailty and tenuous condition.  He deteriorated post admission-and subsequently was transitioned to full comfort  measures after speaking with family.  Pertinent Labs and Studies  Significant Diagnostic Studies Dg Chest 2 View  Result Date: 12/21/2017 CLINICAL DATA:  Pleuritic chest pain for 3 days, history hypertension, prostate cancer, gout EXAM: CHEST - 2 VIEW COMPARISON:  12/20/2017 FINDINGS: Normal heart size, mediastinal contours, and pulmonary vascularity. Atherosclerotic calcifications at aortic arch. Elevation of RIGHT diaphragm. No definite acute infiltrate, pleural effusion or pneumothorax. Osseous structures unremarkable. Pigtail drainage catheter RIGHT upper quadrant. IMPRESSION: Elevation of RIGHT diaphragm, chronic. No acute abnormalities. Electronically Signed   By: Lavonia Dana M.D.   On: 12/21/2017 13:18   Dg Chest 2 View  Result Date: 12/17/2017 CLINICAL DATA:  Sepsis EXAM: CHEST - 2 VIEW COMPARISON:  01/16/2014 FINDINGS: Mild interstitial edema. Heart size and mediastinal contours are stable with minimal aortic atherosclerosis. No pulmonary consolidation. No acute osseous abnormality. IMPRESSION: Mild interstitial edema. No acute pulmonary consolidation. Aortic atherosclerosis. Electronically Signed   By: Ashley Royalty M.D.   On: 12/17/2017 23:05   Ct Angio Neck W And/or Wo Contrast  Result Date: 01/07/2018 CLINICAL DATA:  Dysphagia, unexplained EXAM: CT ANGIOGRAPHY NECK TECHNIQUE: Multidetector CT imaging of the neck was performed using the standard protocol during bolus administration of intravenous contrast. Multiplanar CT image reconstructions and MIPs were obtained to evaluate the vascular anatomy. Carotid stenosis measurements (when applicable) are obtained utilizing NASCET criteria, using the distal internal carotid diameter as the denominator. CONTRAST:  120mL ISOVUE-370 IOPAMIDOL (ISOVUE-370) INJECTION 76% COMPARISON:  None. FINDINGS: Aortic arch: Atherosclerotic plaque. No  dilatation or dissection. Three vessel branching. Right carotid system: Atheromatous wall thickening and calcification of the common carotid. Moderate to bulky plaque at the common carotid bifurcation and ICA bulb. ICA bulb stenosis measures up to 60%. Left carotid system: Atheromatous wall thickening of the common carotid. There is moderate plaque at the ICA bulb without stenosis or ulceration. Vertebral arteries: Proximal subclavian atherosclerosis without flow limiting stenosis. There is calcified plaque at the right vertebral origin with high-grade narrowing-the lumen is not visible for measurement. The non dominant left vertebral artery is occluded proximally with V2 segment reconstitution reaching the dura. Skeleton: History of dysphagia. There is diffuse spondylosis with fairly bulky spurring at C5-6, encroaching on the hypopharynx. Other neck: No noted mass or inflammation. Upper chest: Reported on dedicated chest CT. IMPRESSION: 1. History of dysphagia with no specific vascular cause seen. 2. Cervical carotid atherosclerosis with 60% right ICA stenosis. No flow limiting stenosis on the left. 3. High-grade atheromatous stenosis at the right vertebral origin. 4. Occlusion at the non dominant left vertebral origin with reconstitution in the neck. Both vertebral arteries are patent to the basilar. 5. Notable spondylosis with bulky spurring at C5-6 encroaching on the hypopharynx. 6. Chest CT reported separately. Electronically Signed   By: Monte Fantasia M.D.   On: 12/19/2017 14:17   Ct Angio Chest Pe W And/or Wo Contrast  Result Date: 12/23/2017 CLINICAL DATA:  Disc fascia since Saturday.  Cough. EXAM: CT ANGIOGRAPHY CHEST WITH CONTRAST TECHNIQUE: Multidetector CT imaging of the chest was performed using the standard protocol during bolus administration of intravenous contrast. Multiplanar CT image reconstructions and MIPs were obtained to evaluate the vascular anatomy. CONTRAST:  163mL ISOVUE-370  IOPAMIDOL (ISOVUE-370) INJECTION 76% COMPARISON:  Same day CXR FINDINGS: Cardiovascular: Dimensional branch pattern of the great vessels with atherosclerotic origins. Moderate aortic atherosclerosis without aneurysm. Satisfactory opacification of the pulmonary arteries to the segmental level without acute pulmonary embolus noted. Coronary arteriosclerosis is identified along the LAD. Mediastinum/Nodes: Small  middle mediastinal lymph nodes without pathologic enlargement, the largest is right lower paratracheal measuring up to 9 mm short axis. Patent trachea and mainstem bronchi. Esophagus is unremarkable. Thyroid is normal. Lungs/Pleura: Patchy pulmonary consolidations in the lingula and both lower lobes suspicious for areas of alveolitis/pneumonitis and/or pneumonia. Subpleural areas of minimal airspace disease in both upper lobes and along the dependent aspect of left upper lobe. Small right pleural effusion and trace left. Mild peribronchial thickening to both lower lobes. Upper Abdomen: Percutaneous cholecystostomy tube noted. Small to moderate volume of perihepatic ascites. Slight surface nodularity of the liver compatible with changes of cirrhosis. No splenomegaly. Mild mesenteric edema noted of the included upper abdomen. Musculoskeletal: No acute nor suspicious osseous abnormalities. Review of the MIP images confirms the above findings. IMPRESSION: 1. Bibasilar airspace opacities compatible with atelectasis and/or minimal pneumonia, right greater than left with associated trace left and small right pleural effusions. Faint ground-glass opacities and atelectasis noted elsewhere within the lungs bilaterally. Alveolitis/pneumonitis or stigmata of mild pulmonary edema might also account for this appearance. 2. Cirrhotic liver with small to moderate volume of perihepatic ascites. Cholecystostomy tube in place. 3. No acute pulmonary embolus. Aortic Atherosclerosis (ICD10-I70.0). Electronically Signed   By: Ashley Royalty M.D.   On: 01/06/2018 14:12   Mr Abdomen Mrcp Wo Contrast  Result Date: 12/18/2017 CLINICAL DATA:  Right upper quadrant pain and jaundice. Abnormal liver and gallbladder on ultrasound. EXAM: MRI ABDOMEN WITHOUT CONTRAST  (INCLUDING MRCP) TECHNIQUE: Multiplanar multisequence MR imaging of the abdomen was performed. Heavily T2-weighted images of the biliary and pancreatic ducts were obtained, and three-dimensional MRCP images were rendered by post processing. COMPARISON:  Ultrasound on 12/17/2017 FINDINGS: Lower chest: No acute findings. Hepatobiliary: Image degradation by motion artifact noted. Hepatic cirrhosis is demonstrated. No hepatic mass visualized on this unenhanced exam. The gallbladder is dilated and shows moderate diffuse wall thickening and pericholecystic edema. Sludge is also seen within the gallbladder lumen. These findings are greater than typically seen with cirrhosis, and acute cholecystitis cannot be excluded. There is no evidence of biliary ductal dilatation, with common bile duct measuring approximately 5 mm. Pancreas: No mass or inflammatory process visualized on this unenhanced exam. No evidence of pancreatic ductal dilatation. Spleen:  Within normal limits in size. Adrenals/Urinary tract: Tiny right renal cyst noted. No evidence of hydronephrosis. Stomach/Bowel: Visualized portion unremarkable. Vascular/Lymphatic: No pathologically enlarged lymph nodes identified. No evidence of abdominal aortic aneurysm. Other:  Mild ascites. Musculoskeletal:  No suspicious bone lesions identified. IMPRESSION: Hepatic cirrhosis and mild ascites. Dilated gallbladder with sludge, moderate diffuse wall thickening and pericholecystic edema. These findings are greater than typically seen with cirrhosis, and acute cholecystitis cannot be excluded. If clinically warranted, nuclear medicine hepatobiliary scan could be performed for further evaluation. No evidence of biliary ductal dilatation. Electronically  Signed   By: Earle Gell M.D.   On: 12/18/2017 07:32   Mr 3d Recon At Scanner  Result Date: 12/18/2017 CLINICAL DATA:  Right upper quadrant pain and jaundice. Abnormal liver and gallbladder on ultrasound. EXAM: MRI ABDOMEN WITHOUT CONTRAST  (INCLUDING MRCP) TECHNIQUE: Multiplanar multisequence MR imaging of the abdomen was performed. Heavily T2-weighted images of the biliary and pancreatic ducts were obtained, and three-dimensional MRCP images were rendered by post processing. COMPARISON:  Ultrasound on 12/17/2017 FINDINGS: Lower chest: No acute findings. Hepatobiliary: Image degradation by motion artifact noted. Hepatic cirrhosis is demonstrated. No hepatic mass visualized on this unenhanced exam. The gallbladder is dilated and shows moderate diffuse wall thickening and pericholecystic  edema. Sludge is also seen within the gallbladder lumen. These findings are greater than typically seen with cirrhosis, and acute cholecystitis cannot be excluded. There is no evidence of biliary ductal dilatation, with common bile duct measuring approximately 5 mm. Pancreas: No mass or inflammatory process visualized on this unenhanced exam. No evidence of pancreatic ductal dilatation. Spleen:  Within normal limits in size. Adrenals/Urinary tract: Tiny right renal cyst noted. No evidence of hydronephrosis. Stomach/Bowel: Visualized portion unremarkable. Vascular/Lymphatic: No pathologically enlarged lymph nodes identified. No evidence of abdominal aortic aneurysm. Other:  Mild ascites. Musculoskeletal:  No suspicious bone lesions identified. IMPRESSION: Hepatic cirrhosis and mild ascites. Dilated gallbladder with sludge, moderate diffuse wall thickening and pericholecystic edema. These findings are greater than typically seen with cirrhosis, and acute cholecystitis cannot be excluded. If clinically warranted, nuclear medicine hepatobiliary scan could be performed for further evaluation. No evidence of biliary ductal dilatation.  Electronically Signed   By: Earle Gell M.D.   On: 12/18/2017 07:32   Ir Perc Cholecystostomy  Result Date: 12/19/2017 INDICATION: 84 year old male with acute cholecystitis (sludge, no definite stones). He is currently a poor operative candidate in the setting of hepatic cirrhosis. He presents for percutaneous cholecystostomy tube placement. EXAM: CHOLECYSTOSTOMY MEDICATIONS: In patient currently receiving intravenous cefepime and Flagyl. No additional antibiotic prophylaxis administered. ANESTHESIA/SEDATION: Moderate (conscious) sedation was employed during this procedure. A total of Versed 1 mg and Fentanyl 50 mcg was administered intravenously. Moderate Sedation Time: 13 minutes. The patient's level of consciousness and vital signs were monitored continuously by radiology nursing throughout the procedure under my direct supervision. FLUOROSCOPY TIME:  Fluoroscopy Time: 1 minutes 0 seconds (21 mGy). COMPLICATIONS: None immediate. PROCEDURE: Informed written consent was obtained from the patient after a thorough discussion of the procedural risks, benefits and alternatives. All questions were addressed. Maximal Sterile Barrier Technique was utilized including caps, mask, sterile gowns, sterile gloves, sterile drape, hand hygiene and skin antiseptic. A timeout was performed prior to the initiation of the procedure. The right upper quadrant was interrogated with ultrasound. The right hemidiaphragm is elevated. The liver sits high in the right upper quadrant. The distended and inflamed gallbladder containing sludge was successfully identified. There is a small volume of perihepatic ascites. A suitable skin entry site was selected and marked. Local anesthesia was attained by infiltration with 1% lidocaine. A small dermatotomy was made. Under real-time sonographic guidance, the gallbladder lumen was punctured with a 21 gauge Accustick needle along a short transhepatic course. The 0.018 wire was then coiled in the  gallbladder lumen. The needle was exchanged over the wire for the Accustick sheath which was advanced into the gallbladder. A gentle hand injection of contrast material confirms the Accustick sheath is within the gallbladder lumen. There is extensive debris within the lumen and cobblestoning of the mucosa. A Bentson wire was then coiled in the gallbladder lumen. The Accustick sheath was removed. The percutaneous tract was dilated to 10 [place] and a Cook 10.2 [place] all-purpose drainage catheter was advanced over the wire and formed in the gallbladder lumen. Aspiration was performed yielding approximately 60 mL of thick, purulent bile. A sample was sent for Gram stain and culture. The cholecystostomy tube was gently flushed and then connected to gravity bag drainage. The catheter was secured to the skin with 0 Prolene suture. The patient tolerated the procedure well. IMPRESSION: Successful placement of a percutaneous transhepatic cholecystostomy tube for the treatment of acute cholecystitis (sludge, no definite stones on imaging). PLAN: 1. Maintain tube to gravity bag  drainage. 2. Given patient's underlying cirrhosis, he is unlikely to be common optimal surgical candidate in the future. 3. IR drain clinic in 4 weeks for cholangiogram through existing tube. If the cystic duct regains patency, the percutaneous drainage catheter could be removed after 6 weeks given that there is no definitive evidence of cholelithiasis. Signed, Criselda Peaches, MD [facility] Electronically Signed   By: Jacqulynn Cadet M.D.   On: 12/19/2017 16:13   Dg Chest Port 1 View  Result Date: 2018-01-17 CLINICAL DATA:  Cough and shortness of breath. EXAM: PORTABLE CHEST 1 VIEW COMPARISON:  CT 12/28/2017 FINDINGS: Again seen elevation of right hemidiaphragm. Development of bilateral patchy opacities, slight mid lung distribution. Heart is normal in size. Minimal blunting of both  costophrenic angles possible small effusions. No pneumothorax. IMPRESSION: Development of patchy bilateral opacities over the past 2 days, may be pulmonary edema, pneumonia, or aspiration. Electronically Signed   By: Jeb Levering M.D.   On: January 17, 2018 07:06   Dg Chest Portable 1 View  Result Date: 12/25/2017 CLINICAL DATA:  Hypoxia EXAM: PORTABLE CHEST 1 VIEW COMPARISON:  Dec 21, 2017 FINDINGS: There is underlying fibrotic change throughout the lungs bilaterally. There is no frank edema or consolidation. Heart is upper normal in size with pulmonary vascularity within normal limits. There is aortic atherosclerosis. No adenopathy. No bone lesions. IMPRESSION: Underlying fibrotic change bilaterally. No edema or consolidation. Heart size normal. There is aortic atherosclerosis. Aortic Atherosclerosis (ICD10-I70.0). Electronically Signed   By: Lowella Grip III M.D.   On: 12/18/2017 12:03   Dg Chest Port 1 View  Result Date: 12/20/2017 CLINICAL DATA:  Cough.  History of hypertension. EXAM: PORTABLE CHEST 1 VIEW COMPARISON:  12/17/2017; 01/26/2014; 08/14/2011; ultrasound fluoroscopic guided cholecystostomy tube placement-12/19/2017 FINDINGS: Grossly unchanged cardiac silhouette and mediastinal contours with atherosclerotic plaque within thoracic aorta. The pulmonary vasculature remains indistinct with cephalization of flow. Grossly unchanged bibasilar opacities favored to represent atelectasis. No new focal airspace opacities. No pleural effusion or pneumothorax. No acute osseus abnormalities. Cholecystostomy tube overlies the right upper abdominal quadrant. IMPRESSION: Suspected mild pulmonary edema, though note, atypical infection could have a similar appearance. Clinical correlation is advised. Electronically Signed   By: Sandi Mariscal M.D.   On: 12/20/2017 14:38   Dg Swallowing Func-speech Pathology  Result Date: 12/22/2017 Objective Swallowing Evaluation: Type of Study: MBS-Modified Barium Swallow  Study  Patient Details Name: ENDI LAGMAN MRN: [id number] Date of Birth: 03-08-1933 Today's Date: 12/22/2017 Time: SLP Start Time (ACUTE ONLY): 1030 -SLP Stop Time (ACUTE ONLY): 1100 SLP Time Calculation (min) (ACUTE ONLY): 30 min Past Medical History: Past Medical History: Diagnosis Date . Arthritis  . AV block 05/13 ecg  1st degree . Bleeding   bladder,radiation cystitis-s/p cystoscopy, clot evacuation, fulgeration of bleeders . Cancer ([place])   prostate, hx of bsal cell skin cancer  . Cataract  . Gout  . Hematuria  . Hemorrhoid  . History of basal cell carcinoma excision 08-29-2011  left frontal scalp . History of gout PER PT -  STABLE . History of prostate cancer 2004   -  RX EXTERNAL RADIATION . Hx of subdural hematoma 08-14-2011    s/p total left hip  12-92012---  resolved w/ no residual . Hyperlipidemia  . Hypertension  . IFG (impaired fasting glucose)  . Lab findings of hypoxemia   abnormal ONO , dr perini. 05-26-2012 . Microalbuminuria 2011 . OA (osteoarthritis) of hip  . Obstructive apnea   AHI of  35 on 01-14-13 , titrated to 6 cm H20 . Pneumonia   hx of 2015  . Shoulder impingement syndrome   right . Sleep apnea   cpap nightly . Sleep apnea with use of continuous positive airway pressure (CPAP) 03/19/2013 Past Surgical History: Past Surgical History: Procedure Laterality Date . CARDIAC CATHETERIZATION    pt denies . COLONOSCOPY   . CYSTOSCOPY WITH BIOPSY  07/03/2012  Procedure: CYSTOSCOPY WITH BIOPSY;  Surgeon: Franchot Gallo, MD;  Location[facility];  Service: Urology;  Laterality: N/A;   . CYSTOSCOPY WITH FULGERATION N/A 12/02/2016  Procedure: CYSTOSCOPY WITH FULGERATION OF BLEEDERS AND TRANSURETRAL INCISION OF PROSTATE;  Surgeon: Franchot Gallo, MD;  Location: [facility];  Service: Urology;  Laterality: N/A; . EYE SURGERY  2009  bilateral cataract surgery  . IR PERC CHOLECYSTOSTOMY  12/19/2017 . JOINT REPLACEMENT   . MOHS SURGERY   . PROSTATE SURGERY   . RIGHT ELBOW SURGERY  MAR 2013 . right  elbow surgery  2009  ELBOW AND WRIST SURGERY . TONSILLECTOMY    AGE 5 . TOTAL HIP ARTHROPLASTY  07/31/2011  Procedure: TOTAL HIP ARTHROPLASTY;  Surgeon: Dione Plover Aluisio;  Location: [facility];  Service: Orthopedics;  Laterality: Left; . TOTAL HIP ARTHROPLASTY   . TRANSURETHRAL RESECTION OF PROSTATE  07/25/2012  Procedure: TRANSURETHRAL RESECTION OF THE PROSTATE WITH GYRUS INSTRUMENTS;  Surgeon: Alexis Frock, MD;  Location: [facility];  Service: Urology;  Laterality: N/A;  CYSTOSCOPY;CLOT EVACUATION WITH FULGERATION . ULNAR NERVE REPAIR  04/13 HPI: Pt. with PMH of HTN, HLD, gout, prostate cancer, hemorrhagic cystitis S/P fulguration, GERD; admitted on 12/17/2017, presented with complaint of abdominal pain, was found to have acute cholecystitis, s/p percutaneous cholecystostomy 12/19/17; on clears per surgery to be advanced as tolerated. Per GI pt with recent trouble swallowing with increased regurgitation prior to admission. GI recommending dysphagia w/u with esophagram, likely next week.  Subjective: Awake in bed Assessment / Plan / Recommendation CHL IP CLINICAL IMPRESSIONS 12/22/2017 Clinical Impression Pt demonstrates a mild oropharyngeal dysphagia with structural difference given  appearance of bony protrusions on anterior cervical spine from C2-C6 at least. Pt observed to independently compensate for reduced UES opening by sustaining laryngeal elevation and closure over 2-3 instances of propulsion/hyoid excursion/UES opening to fully transit bolus. Minimal thin and solid residue clings to posterior pharyngeal wall and around epiglottis right bove where it contacts with the beginning of most significant protrusion of pharyngeal wall. Pt clears most residue with two swallows and a liquid wash. Pt may resume a regular diet and thin liquids with use of strategies with f/u from SLP. Did not advance solids given GI issues. MD may advance to regular thin if pt ready  SLP Visit Diagnosis Dysphagia, unspecified (R13.10) Attention  and concentration deficit following -- Frontal lobe and executive function deficit following -- Impact on safety and function Mild aspiration risk   CHL IP TREATMENT RECOMMENDATION 12/22/2017 Treatment Recommendations Therapy as outlined in treatment plan below   Prognosis 12/22/2017 Prognosis for Safe Diet Advancement Good Barriers to Reach Goals -- Barriers/Prognosis Comment -- CHL IP DIET RECOMMENDATION 12/22/2017 SLP Diet Recommendations Regular solids;Thin liquid Liquid Administration via Cup;Straw Medication Administration Whole meds with puree Compensations Slow rate;Small sips/bites;Follow solids with liquid;Multiple dry swallows after each bite/sip Postural Changes --   [facility] IP OTHER RECOMMENDATIONS 12/22/2017 Recommended Consults -- Oral Care Recommendations Oral care BID Other Recommendations --   [facility] IP FOLLOW UP RECOMMENDATIONS 12/22/2017 Follow up Recommendations Skilled Nursing facility   [facility] IP FREQUENCY  AND DURATION 12/22/2017 Speech Therapy Frequency (ACUTE ONLY) min 2x/week Treatment Duration 2 weeks      No flowsheet data found. No flowsheet data found. No flowsheet data found. No flowsheet data found. Herbie Baltimore, [place] CCC-SLP [phone] [name] 12/22/2017, 2:11 PM              US Abdomen Limited Ruq  Result Date: 12/17/2017 CLINICAL DATA:  Right upper quadrant abdominal pain and jaundice. EXAM: ULTRASOUND ABDOMEN LIMITED RIGHT UPPER QUADRANT COMPARISON:  None. FINDINGS: Gallbladder: Tumefactive biliary sludge is identified within the gallbladder. The single wall thickness of the gallbladder is 4.2 mm, normal less than 3 mm. No pericholecystic fluid. No sonographic Murphy sign noted by sonographer. Common bile duct: Diameter: 5 mm Liver: No focal lesion identified given limitations due to the patient's inability to adequately position for better images. Within normal limits in parenchymal echogenicity. Portal vein is patent on color Doppler imaging though indeterminate in direction of  blood flow given mixed color signal noted within. Nodular liver surface compatible with cirrhosis. Adjacent small volume of ascites. IMPRESSION: 1. Thick-walled gallbladder with biliary sludge noted within. 2. Cirrhotic appearing liver with adjacent small volume of perihepatic ascites. Mixed color Doppler signal within the imaged portal venous system may represent early changes of portal hypertension though the portal vein is patent. Electronically Signed   By: Ashley Royalty M.D.   On: 12/17/2017 19:55    Microbiology Recent Results (from the past 240 hour(s))  Blood culture (routine x 2)     Status: None (Preliminary result)   Collection Time: 01/01/2018  1:03 PM  Result Value Ref Range Status   Specimen Description BLOOD LEFT FOREARM  Final   Special Requests   Final    BOTTLES DRAWN AEROBIC AND ANAEROBIC Blood Culture adequate volume   Culture   Final    NO GROWTH 2 DAYS Performed at [facility], [address], [place], [place] [address]    Report Status PENDING  Incomplete  Blood culture (routine x 2)     Status: None (Preliminary result)   Collection Time: 12/26/2017  1:18 PM  Result Value Ref Range Status   Specimen Description BLOOD RIGHT ANTECUBITAL  Final   Special Requests   Final    BOTTLES DRAWN AEROBIC AND ANAEROBIC Blood Culture adequate volume   Culture   Final    NO GROWTH 2 DAYS Performed at [facility], [place] [address], [place], [place] [address]    Report Status PENDING  Incomplete  MRSA PCR Screening     Status: None   Collection Time: 12/30/17  5:06 PM  Result Value Ref Range Status   MRSA by PCR NEGATIVE NEGATIVE Final    Comment:        The GeneXpert MRSA Assay (FDA approved for NASAL specimens only), is one component of a comprehensive MRSA colonization surveillance program. It is not intended to diagnose MRSA infection nor to guide or monitor treatment for MRSA infections. Performed at [facility], [place] [address] [place], [place]  [address]     Lab Basic Metabolic Panel: Recent Labs  Lab 01/03/2018 1304 12/30/17 0245 01/23/18 0622  NA 142 144 148*  K 5.0 4.6 4.5  CL 111 120* 122*  CO2 18* 18* 15*  GLUCOSE 84 81 71  BUN 34* 35* 40*  CREATININE 2.38* 2.05* 1.98*  CALCIUM 8.4* 7.5* 7.7*   Liver Function Tests: Recent Labs  Lab 12/28/2017 1304  AST 39  ALT 13*  ALKPHOS 126  BILITOT 2.9*  PROT 5.8*  ALBUMIN 2.2*   No results for input(s): LIPASE, AMYLASE in the last 168 hours. No results for input(s): AMMONIA in the last 168 hours. CBC: Recent Labs  Lab 12/27/17 01/05/2018 1304 12/30/17 0245 Jan 08, 2018 0622  WBC 10.3 15.9* 15.8* 15.4*  NEUTROABS 7 13.7*  --   --   HGB 12.6* 12.2* 11.0* 11.6*  HCT 37* 36.1* 32.7* 34.3*  MCV  --  99.7 101.9* 100.9*  PLT 240 206 169 165   Cardiac Enzymes: No results for input(s): CKTOTAL, CKMB, CKMBINDEX, TROPONINI in the last 168 hours. Sepsis Labs: Recent Labs  Lab 12/27/17 12/30/2017 1304 01/05/2018 1315 12/28/2017 1628 01/04/2018 1958 12/30/17 0245 Jan 08, 2018 0622  PROCALCITON  --   --   --   --  6.01  --   --   WBC 10.3 15.9*  --   --   --  15.8* 15.4*  LATICACIDVEN  --   --  4.61* 3.9* 2.5* 1.9  --     Procedures/Operations   Savier Trickett 01/01/2018, 12:28 PM

## 2018-01-10 NOTE — Progress Notes (Signed)
Seen and examined Worse today More lethargic, tachycardic Still gurgling and pooling secretions Spoke with daughter-explained that patient is actively dying-he is definitely not going to tolerate sedation for EGD. EGD is not likely going to change management as well. Recommended we transition to comfort measures-daughter agreed.  Will go ahead and stop all Abx-and begin full comfort measures Daughter/family has a meeting with palliative care at noon today Will let palliative care team know as well. Full note to follow

## 2018-01-10 NOTE — Progress Notes (Signed)
Family in shock over sudden change in patient's health.  Concerned about his son who lives with him but has psychological issues. They cant even think of telling him now.  Daughter is overwhelmed-mother died not long ago.  They are in process of notifying other family members. Please call chaplain if needed to return.   Spiritual support needed-Had prayer with daughter [name]. Conard Novak, Chaplain   01/25/18 1100  Clinical Encounter Type  Visited With Patient and family together  Visit Type Initial;Spiritual support  Referral From Nurse  Consult/Referral To Chaplain  Spiritual Encounters  Spiritual Needs Prayer;Emotional  Stress Factors  Patient Stress Factors None identified  Family Stress Factors Health changes;Loss;Major life changes;Other (Comment) (son bipolar and lives with patient-concerned over his care)

## 2018-01-10 NOTE — Progress Notes (Signed)
Nutrition Brief Note  Pt identified on the Malnutrition Screening Tool (MST) Report. Pt transitioned to full comfort care. Palliative Medicine following. No nutrition interventions warranted at this time.  Please consult as needed.   Arthur Holms, RD, LDN Pager #: [phone] After-Hours Pager #: [phone]

## 2018-01-10 NOTE — Progress Notes (Signed)
Patient has transitioned to full comfort care. Daughter [name] has been made aware.

## 2018-01-10 NOTE — Progress Notes (Addendum)
PROGRESS NOTE        PATIENT DETAILS Name: Lance Martin Age: 84 y.o. 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  Thought to have acute hypoxemic respiratory failure secondary to aspiration pneumonia-started on broad-spectrum antibiotics and admitted to the hospitalist service.  See below for further details  Subjective: Lethargic-does not open eyes-tachycardic to the 130s-140s.  Still with a lot of secretions that are poolling in the back of his throat.    Assessment/Plan: Sepsis with acute hypoxic respiratory secondary to aspiration pneumonitis: Seems to have worsened overnight-he is much more lethargic-continues to have significant evidence of accumulation of secretions and resultant ongoing aspiration-he has coarse rales all over.  Etiology of aspiration pneumonia is severe dysphagia to both liquids and solids.  Unfortunately he appears to have a bone spur that is impinging on his pharynx, he probably also has a motility disorder.  Given severe frailty and overall tenuous condition-I do not think he will be able to tolerate sedation for any endoscopic procedures, furthermore endoscopy procedures probably will not change management/outcome as well.  I subsequently placed a phone call to his daughter-explained very poor prognosis-and recommended that we transition him to full comfort measures.  Family agreed-we have now we will transition him to full comfort care.  Will await further recommendations from the palliative care team-they have a meeting with the family later today.    Dysphagia: This was  present during his most recent hospitalization as well.  Dysphagia is both to solids and liquids-per patient-he instantly regurgitates/brings up liquids and solid food.  Imaging studies so far suggest bone spur from cervical spine disease pushing on the hypopharynx.  He probably has underlying motility disorder as well.  He unfortunately seems to have further episodes of aspiration-his overall status very tenuous as outlined above.  Appreciate GI input-completely agree that he will not be able to tolerate sedation in his current state of health.  Since he has worsened, after talking with family we have transition him to full comfort measures.    Acute kidney injury: Likely hemodynamically mediated-in the setting of sepsis.  More improved with gentle hydration.    Hypernatremia: Secondary to poor oral intake-since transition to full comfort measures-we will stop all IV hydration.  Recent history of acute cholecystitis status post cholecystostomy tube placement: Abdominal exam benign-no longer will require antimicrobial therapy-as he has been transition to full comfort measures.  Cirrhosis: Appears stable-no further intervention needed at this time  Mild oozing/bleeding from anterior abdominal wall at site of heparin injection.  Can you compression dressings-and follow  OSA: Continue to hold CPAP due to accumulation of significant secretions.  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  Evaluated by gastroenterology-not a candidate for endoscopic procedures given overall frailty and tenuous condition.  He appears to have deteriorated overnight-after speaking with family he has been transition to full comfort measures.  Will await further recommendations from palliative care team as well   DVT Prophylaxis: Not required-as comfort  care  Code  Status:  DNR  Family Communication: Daughter over the phone this morning  Disposition Plan: Remain inpatient-suspect may pass while inpatient  Antimicrobial agents: Anti-infectives (From admission, onward)   Start     Dose/Rate Route Frequency Ordered Stop   12/30/17 1600  vancomycin (VANCOCIN) IVPB 750 mg/150 ml premix  Status:  Discontinued     750 mg 150 mL/hr over 60 Minutes Intravenous Every 24 hours 12/21/2017 1441 27-Jan-2018 0822   12/30/17 0300  Ampicillin-Sulbactam (UNASYN) 3 g in sodium chloride 0.9 % 100 mL IVPB  Status:  Discontinued     3 g 200 mL/hr over 30 Minutes Intravenous Every 12 hours 01/09/2018 1644 27-Jan-2018 0822   01/08/2018 1430  piperacillin-tazobactam (ZOSYN) IVPB 2.25 g  Status:  Discontinued     2.25 g 100 mL/hr over 30 Minutes Intravenous  Once 12/17/2017 1421 12/14/2017 1425   12/15/2017 1430  piperacillin-tazobactam (ZOSYN) IVPB 3.375 g     3.375 g 100 mL/hr over 30 Minutes Intravenous  Once 01/04/2018 1426 01/01/2018 1531   12/21/2017 1430  vancomycin (VANCOCIN) 1,500 mg in sodium chloride 0.9 % 500 mL IVPB     1,500 mg 250 mL/hr over 120 Minutes Intravenous  Once 12/12/2017 1426 12/14/2017 1701      Procedures: None  CONSULTS:  GI and Palliative care  Time spent: 45 minutes-Greater than 50% of this time was spent in counseling, explanation of diagnosis, planning of further management, and coordination of care.  MEDICATIONS: Scheduled Meds: . glycopyrrolate  0.2 mg Intravenous Q4H  . scopolamine  1 patch Transdermal Q72H  . sodium chloride flush  3 mL Intravenous Q12H   Continuous Infusions: . sodium chloride    . HYDROmorphone 0.5 mg/hr (01/27/2018 0951)   PRN Meds:.sodium chloride, acetaminophen **OR** acetaminophen, antiseptic oral rinse, diphenhydrAMINE, glycopyrrolate **OR** glycopyrrolate **OR** glycopyrrolate, haloperidol **OR** haloperidol **OR** haloperidol lactate, HYDROmorphone, LORazepam **OR** LORazepam **OR** LORazepam, ondansetron **OR**  ondansetron (ZOFRAN) IV, sodium chloride flush   PHYSICAL EXAM: Vital signs: Vitals:   2018/01/27 0500 2018-01-27 0600 01-27-18 0715 01-27-2018 0829  BP: 126/67 108/61  (!) 106/58  Pulse: (!) 130 (!) 133 (!) 137 (!) 136  Resp: (!) 36 (!) 33 (!) 40 (!) 25  Temp:    100 F (37.8 C)  TempSrc:    Axillary  SpO2: 90% 91% (!) 89% 90%  Weight: 78.5 kg (173 lb 1 oz)     Height:       Filed Weights   12/30/17 1700 January 27, 2018 0500  Weight: 76.7 kg (169 lb 1.5 oz) 78.5 kg (173 lb 1 oz)   Body mass index is 24.14 kg/m.   General appearance: Sick looking-much more lethargic compared to yesterday.  Still "gurgling". Eyes:no scleral icterus. HEENT: Atraumatic and Normocephalic Neck: supple, no JVD. Resp:Good air entry bilaterally, coarse rales all over-significant amount of transmitted upper airway sounds CVS: S1 S2 regular, tachycardic  GI: Bowel sounds present, Non tender and not distended with no gaurding, rigidity or rebound.cholecystostomy tube in place Extremities: B/L Lower Ext shows no edema, both legs are warm to touch Neurology: Lethargic-limited exam but appears nonfocal Musculoskeletal:No digital cyanosis Skin:No Rash, warm and dry Wounds:N/A  I have personally reviewed following labs and imaging studies  LABORATORY DATA: CBC: Recent Labs  Lab 12/27/17 12/28/2017 1304 12/30/17 0245 01-27-18 0622  WBC 10.3 15.9* 15.8* 15.4*  NEUTROABS 7 13.7*  --   --   HGB 12.6* 12.2* 11.0* 11.6*  HCT 37* 36.1* 32.7* 34.3*  MCV  --  99.7 101.9* 100.9*  PLT 240 206 169 161    Basic Metabolic Panel: Recent Labs  Lab 01/05/2018 1304 12/30/17 0245 01/24/2018 0622  NA 142 144 148*  K 5.0 4.6 4.5  CL 111 120* 122*  CO2 18* 18* 15*  GLUCOSE 84 81 71  BUN 34* 35* 40*  CREATININE 2.38* 2.05* 1.98*  CALCIUM 8.4* 7.5* 7.7*    GFR: Estimated Creatinine Clearance: 30.1 mL/min (A) (by C-G formula based on SCr of 1.98 mg/dL (H)).  Liver Function Tests: Recent Labs  Lab 12/10/2017 1304  AST  39  ALT 13*  ALKPHOS 126  BILITOT 2.9*  PROT 5.8*  ALBUMIN 2.2*   No results for input(s): LIPASE, AMYLASE in the last 168 hours. No results for input(s): AMMONIA in the last 168 hours.  Coagulation Profile: No results for input(s): INR, PROTIME in the last 168 hours.  Cardiac Enzymes: No results for input(s): CKTOTAL, CKMB, CKMBINDEX, TROPONINI in the last 168 hours.  BNP (last 3 results) No results for input(s): PROBNP in the last 8760 hours.  HbA1C: No results for input(s): HGBA1C in the last 72 hours.  CBG: No results for input(s): GLUCAP in the last 168 hours.  Lipid Profile: No results for input(s): CHOL, HDL, LDLCALC, TRIG, CHOLHDL, LDLDIRECT in the last 72 hours.  Thyroid Function Tests: No results for input(s): TSH, T4TOTAL, FREET4, T3FREE, THYROIDAB in the last 72 hours.  Anemia Panel: No results for input(s): VITAMINB12, FOLATE, FERRITIN, TIBC, IRON, RETICCTPCT in the last 72 hours.  Urine analysis:    Component Value Date/Time   COLORURINE AMBER (A) 12/18/2017 0025   APPEARANCEUR CLEAR 12/18/2017 0025   LABSPEC 1.015 12/18/2017 0025   PHURINE 5.0 12/18/2017 0025   GLUCOSEU NEGATIVE 12/18/2017 0025   HGBUR NEGATIVE 12/18/2017 0025   BILIRUBINUR NEGATIVE 12/18/2017 0025   KETONESUR NEGATIVE 12/18/2017 0025   PROTEINUR 100 (A) 12/18/2017 0025   UROBILINOGEN 0.2 08/14/2011 2130   NITRITE NEGATIVE 12/18/2017 0025   LEUKOCYTESUR NEGATIVE 12/18/2017 0025    Sepsis Labs: Lactic Acid, Venous    Component Value Date/Time   LATICACIDVEN 1.9 12/30/2017 0245    MICROBIOLOGY: Recent Results (from the past 240 hour(s))  Blood culture (routine x 2)     Status: None (Preliminary result)   Collection Time: 12/16/2017  1:03 PM  Result Value Ref Range Status   Specimen Description BLOOD LEFT FOREARM  Final   Special Requests   Final    BOTTLES DRAWN AEROBIC AND ANAEROBIC Blood Culture adequate volume   Culture   Final    NO GROWTH 1 DAY Performed at [facility], [place] [address], [place], [place] [address]    Report Status PENDING  Incomplete  Blood culture (routine x 2)     Status: None (Preliminary result)   Collection Time: 12/30/2017  1:18 PM  Result Value Ref Range Status   Specimen Description BLOOD RIGHT ANTECUBITAL  Final   Special Requests   Final    BOTTLES DRAWN AEROBIC AND ANAEROBIC Blood Culture adequate volume   Culture   Final    NO GROWTH 1 DAY Performed at [facility], [place] [address] [place], [place] [address]    Report Status PENDING  Incomplete  MRSA PCR Screening     Status: None   Collection Time: 12/30/17  5:06 PM  Result Value Ref Range Status   MRSA by PCR NEGATIVE NEGATIVE Final    Comment:        The GeneXpert  MRSA Assay (FDA approved for NASAL specimens only), is one component of a comprehensive MRSA colonization surveillance program. It is not intended to diagnose MRSA infection nor to guide or monitor treatment for MRSA infections. Performed at [facility], [place] [address], [place], [place] [address]     RADIOLOGY STUDIES/RESULTS: Dg Chest 2 View  Result Date: 12/21/2017 CLINICAL DATA:  Pleuritic chest pain for 3 days, history hypertension, prostate cancer, gout EXAM: CHEST - 2 VIEW COMPARISON:  12/20/2017 FINDINGS: Normal heart size, mediastinal contours, and pulmonary vascularity. Atherosclerotic calcifications at aortic arch. Elevation of RIGHT diaphragm. No definite acute infiltrate, pleural effusion or pneumothorax. Osseous structures unremarkable. Pigtail drainage catheter RIGHT upper quadrant. IMPRESSION: Elevation of RIGHT diaphragm, chronic. No acute abnormalities. Electronically Signed   By: Lavonia Dana M.D.   On: 12/21/2017 13:18   Dg Chest 2 View  Result Date: 12/17/2017 CLINICAL DATA:  Sepsis EXAM: CHEST - 2 VIEW COMPARISON:  01/16/2014 FINDINGS: Mild interstitial edema. Heart size and mediastinal contours are stable with minimal aortic atherosclerosis. No pulmonary  consolidation. No acute osseous abnormality. IMPRESSION: Mild interstitial edema. No acute pulmonary consolidation. Aortic atherosclerosis. Electronically Signed   By: Ashley Royalty M.D.   On: 12/17/2017 23:05   Ct Angio Neck W And/or Wo Contrast  Result Date: 12/14/2017 CLINICAL DATA:  Dysphagia, unexplained EXAM: CT ANGIOGRAPHY NECK TECHNIQUE: Multidetector CT imaging of the neck was performed using the standard protocol during bolus administration of intravenous contrast. Multiplanar CT image reconstructions and MIPs were obtained to evaluate the vascular anatomy. Carotid stenosis measurements (when applicable) are obtained utilizing NASCET criteria, using the distal internal carotid diameter as the denominator. CONTRAST:  147mL ISOVUE-370 IOPAMIDOL (ISOVUE-370) INJECTION 76% COMPARISON:  None. FINDINGS: Aortic arch: Atherosclerotic plaque. No dilatation or dissection. Three vessel branching. Right carotid system: Atheromatous wall thickening and calcification of the common carotid. Moderate to bulky plaque at the common carotid bifurcation and ICA bulb. ICA bulb stenosis measures up to 60%. Left carotid system: Atheromatous wall thickening of the common carotid. There is moderate plaque at the ICA bulb without stenosis or ulceration. Vertebral arteries: Proximal subclavian atherosclerosis without flow limiting stenosis. There is calcified plaque at the right vertebral origin with high-grade narrowing-the lumen is not visible for measurement. The non dominant left vertebral artery is occluded proximally with V2 segment reconstitution reaching the dura. Skeleton: History of dysphagia. There is diffuse spondylosis with fairly bulky spurring at C5-6, encroaching on the hypopharynx. Other neck: No noted mass or inflammation. Upper chest: Reported on dedicated chest CT. IMPRESSION: 1. History of dysphagia with no specific vascular cause seen. 2. Cervical carotid atherosclerosis with 60% right ICA stenosis. No flow  limiting stenosis on the left. 3. High-grade atheromatous stenosis at the right vertebral origin. 4. Occlusion at the non dominant left vertebral origin with reconstitution in the neck. Both vertebral arteries are patent to the basilar. 5. Notable spondylosis with bulky spurring at C5-6 encroaching on the hypopharynx. 6. Chest CT reported separately. Electronically Signed   By: Monte Fantasia M.D.   On: 12/12/2017 14:17   Ct Angio Chest Pe W And/or Wo Contrast  Result Date: 01/03/2018 CLINICAL DATA:  Disc fascia since Saturday.  Cough. EXAM: CT ANGIOGRAPHY CHEST WITH CONTRAST TECHNIQUE: Multidetector CT imaging of the chest was performed using the standard protocol during bolus administration of intravenous contrast. Multiplanar CT image reconstructions and MIPs were obtained to evaluate the vascular anatomy. CONTRAST:  141mL ISOVUE-370 IOPAMIDOL (ISOVUE-370) INJECTION 76% COMPARISON:  Same day CXR FINDINGS:  Cardiovascular: Dimensional branch pattern of the great vessels with atherosclerotic origins. Moderate aortic atherosclerosis without aneurysm. Satisfactory opacification of the pulmonary arteries to the segmental level without acute pulmonary embolus noted. Coronary arteriosclerosis is identified along the LAD. Mediastinum/Nodes: Small middle mediastinal lymph nodes without pathologic enlargement, the largest is right lower paratracheal measuring up to 9 mm short axis. Patent trachea and mainstem bronchi. Esophagus is unremarkable. Thyroid is normal. Lungs/Pleura: Patchy pulmonary consolidations in the lingula and both lower lobes suspicious for areas of alveolitis/pneumonitis and/or pneumonia. Subpleural areas of minimal airspace disease in both upper lobes and along the dependent aspect of left upper lobe. Small right pleural effusion and trace left. Mild peribronchial thickening to both lower lobes. Upper Abdomen: Percutaneous cholecystostomy tube noted. Small to moderate volume of perihepatic ascites.  Slight surface nodularity of the liver compatible with changes of cirrhosis. No splenomegaly. Mild mesenteric edema noted of the included upper abdomen. Musculoskeletal: No acute nor suspicious osseous abnormalities. Review of the MIP images confirms the above findings. IMPRESSION: 1. Bibasilar airspace opacities compatible with atelectasis and/or minimal pneumonia, right greater than left with associated trace left and small right pleural effusions. Faint ground-glass opacities and atelectasis noted elsewhere within the lungs bilaterally. Alveolitis/pneumonitis or stigmata of mild pulmonary edema might also account for this appearance. 2. Cirrhotic liver with small to moderate volume of perihepatic ascites. Cholecystostomy tube in place. 3. No acute pulmonary embolus. Aortic Atherosclerosis (ICD10-I70.0). Electronically Signed   By: Ashley Royalty M.D.   On: 12/22/2017 14:12   Mr Abdomen Mrcp Wo Contrast  Result Date: 12/18/2017 CLINICAL DATA:  Right upper quadrant pain and jaundice. Abnormal liver and gallbladder on ultrasound. EXAM: MRI ABDOMEN WITHOUT CONTRAST  (INCLUDING MRCP) TECHNIQUE: Multiplanar multisequence MR imaging of the abdomen was performed. Heavily T2-weighted images of the biliary and pancreatic ducts were obtained, and three-dimensional MRCP images were rendered by post processing. COMPARISON:  Ultrasound on 12/17/2017 FINDINGS: Lower chest: No acute findings. Hepatobiliary: Image degradation by motion artifact noted. Hepatic cirrhosis is demonstrated. No hepatic mass visualized on this unenhanced exam. The gallbladder is dilated and shows moderate diffuse wall thickening and pericholecystic edema. Sludge is also seen within the gallbladder lumen. These findings are greater than typically seen with cirrhosis, and acute cholecystitis cannot be excluded. There is no evidence of biliary ductal dilatation, with common bile duct measuring approximately 5 mm. Pancreas: No mass or inflammatory process  visualized on this unenhanced exam. No evidence of pancreatic ductal dilatation. Spleen:  Within normal limits in size. Adrenals/Urinary tract: Tiny right renal cyst noted. No evidence of hydronephrosis. Stomach/Bowel: Visualized portion unremarkable. Vascular/Lymphatic: No pathologically enlarged lymph nodes identified. No evidence of abdominal aortic aneurysm. Other:  Mild ascites. Musculoskeletal:  No suspicious bone lesions identified. IMPRESSION: Hepatic cirrhosis and mild ascites. Dilated gallbladder with sludge, moderate diffuse wall thickening and pericholecystic edema. These findings are greater than typically seen with cirrhosis, and acute cholecystitis cannot be excluded. If clinically warranted, nuclear medicine hepatobiliary scan could be performed for further evaluation. No evidence of biliary ductal dilatation. Electronically Signed   By: Earle Gell M.D.   On: 12/18/2017 07:32   Mr 3d Recon At Scanner  Result Date: 12/18/2017 CLINICAL DATA:  Right upper quadrant pain and jaundice. Abnormal liver and gallbladder on ultrasound. EXAM: MRI ABDOMEN WITHOUT CONTRAST  (INCLUDING MRCP) TECHNIQUE: Multiplanar multisequence MR imaging of the abdomen was performed. Heavily T2-weighted images of the biliary and pancreatic ducts were obtained, and three-dimensional MRCP images were rendered by post processing. COMPARISON:  Ultrasound on 12/17/2017 FINDINGS: Lower chest: No acute findings. Hepatobiliary: Image degradation by motion artifact noted. Hepatic cirrhosis is demonstrated. No hepatic mass visualized on this unenhanced exam. The gallbladder is dilated and shows moderate diffuse wall thickening and pericholecystic edema. Sludge is also seen within the gallbladder lumen. These findings are greater than typically seen with cirrhosis, and acute cholecystitis cannot be excluded. There is no evidence of biliary ductal dilatation, with common bile duct measuring approximately 5 mm. Pancreas: No mass or  inflammatory process visualized on this unenhanced exam. No evidence of pancreatic ductal dilatation. Spleen:  Within normal limits in size. Adrenals/Urinary tract: Tiny right renal cyst noted. No evidence of hydronephrosis. Stomach/Bowel: Visualized portion unremarkable. Vascular/Lymphatic: No pathologically enlarged lymph nodes identified. No evidence of abdominal aortic aneurysm. Other:  Mild ascites. Musculoskeletal:  No suspicious bone lesions identified. IMPRESSION: Hepatic cirrhosis and mild ascites. Dilated gallbladder with sludge, moderate diffuse wall thickening and pericholecystic edema. These findings are greater than typically seen with cirrhosis, and acute cholecystitis cannot be excluded. If clinically warranted, nuclear medicine hepatobiliary scan could be performed for further evaluation. No evidence of biliary ductal dilatation. Electronically Signed   By: Earle Gell M.D.   On: 12/18/2017 07:32   Ir Perc Cholecystostomy  Result Date: 12/19/2017 INDICATION: 84 year old male with acute cholecystitis (sludge, no definite stones). He is currently a poor operative candidate in the setting of hepatic cirrhosis. He presents for percutaneous cholecystostomy tube placement. EXAM: CHOLECYSTOSTOMY MEDICATIONS: In patient currently receiving intravenous cefepime and Flagyl. No additional antibiotic prophylaxis administered. ANESTHESIA/SEDATION: Moderate (conscious) sedation was employed during this procedure. A total of Versed 1 mg and Fentanyl 50 mcg was administered intravenously. Moderate Sedation Time: 13 minutes. The patient's level of consciousness and vital signs were monitored continuously by radiology nursing throughout the procedure under my direct supervision. FLUOROSCOPY TIME:  Fluoroscopy Time: 1 minutes 0 seconds (21 mGy). COMPLICATIONS: None immediate. PROCEDURE: Informed written consent was obtained from the patient after a thorough discussion of the procedural risks, benefits and  alternatives. All questions were addressed. Maximal Sterile Barrier Technique was utilized including caps, mask, sterile gowns, sterile gloves, sterile drape, hand hygiene and skin antiseptic. A timeout was performed prior to the initiation of the procedure. The right upper quadrant was interrogated with ultrasound. The right hemidiaphragm is elevated. The liver sits high in the right upper quadrant. The distended and inflamed gallbladder containing sludge was successfully identified. There is a small volume of perihepatic ascites. A suitable skin entry site was selected and marked. Local anesthesia was attained by infiltration with 1% lidocaine. A small dermatotomy was made. Under real-time sonographic guidance, the gallbladder lumen was punctured with a 21 gauge Accustick needle along a short transhepatic course. The 0.018 wire was then coiled in the gallbladder lumen. The needle was exchanged over the wire for the Accustick sheath which was advanced into the gallbladder. A gentle hand injection of contrast material confirms the Accustick sheath is within the gallbladder lumen. There is extensive debris within the lumen and cobblestoning of the mucosa. A Bentson wire was then coiled in the gallbladder lumen. The Accustick sheath was removed. The percutaneous tract was dilated to 10 [place] and a Cook 10.2 [place] all-purpose drainage catheter was advanced over the wire and formed in the gallbladder lumen. Aspiration was performed yielding approximately 60 mL of thick, purulent bile. A sample was sent for Gram stain and culture. The cholecystostomy tube was gently flushed and then connected to gravity bag drainage. The catheter was secured to  the skin with 0 Prolene suture. The patient tolerated the procedure well. IMPRESSION: Successful placement of a percutaneous transhepatic cholecystostomy tube for the treatment of acute cholecystitis (sludge, no definite stones on imaging). PLAN: 1. Maintain tube to gravity bag  drainage. 2. Given patient's underlying cirrhosis, he is unlikely to be common optimal surgical candidate in the future. 3. IR drain clinic in 4 weeks for cholangiogram through existing tube. If the cystic duct regains patency, the percutaneous drainage catheter could be removed after 6 weeks given that there is no definitive evidence of cholelithiasis. Signed, Criselda Peaches, MD [facility] Radiology Electronically Signed   By: Jacqulynn Cadet M.D.   On: 12/19/2017 16:13   Dg Chest Port 1 View  Result Date: 01/15/2018 CLINICAL DATA:  Cough and shortness of breath. EXAM: PORTABLE CHEST 1 VIEW COMPARISON:  CT 12/18/2017 FINDINGS: Again seen elevation of right hemidiaphragm. Development of bilateral patchy opacities, slight mid lung distribution. Heart is normal in size. Minimal blunting of both costophrenic angles possible small effusions. No pneumothorax. IMPRESSION: Development of patchy bilateral opacities over the past 2 days, may be pulmonary edema, pneumonia, or aspiration. Electronically Signed   By: Jeb Levering M.D.   On: 15-Jan-2018 07:06   Dg Chest Portable 1 View  Result Date: 12/20/2017 CLINICAL DATA:  Hypoxia EXAM: PORTABLE CHEST 1 VIEW COMPARISON:  Dec 21, 2017 FINDINGS: There is underlying fibrotic change throughout the lungs bilaterally. There is no frank edema or consolidation. Heart is upper normal in size with pulmonary vascularity within normal limits. There is aortic atherosclerosis. No adenopathy. No bone lesions. IMPRESSION: Underlying fibrotic change bilaterally. No edema or consolidation. Heart size normal. There is aortic atherosclerosis. Aortic Atherosclerosis (ICD10-I70.0). Electronically Signed   By: Lowella Grip III M.D.   On: 01/06/2018 12:03   Dg Chest Port 1 View  Result Date: 12/20/2017 CLINICAL DATA:  Cough.  History of hypertension. EXAM: PORTABLE CHEST 1 VIEW COMPARISON:  12/17/2017; 01/26/2014;  08/14/2011; ultrasound fluoroscopic guided cholecystostomy tube placement-12/19/2017 FINDINGS: Grossly unchanged cardiac silhouette and mediastinal contours with atherosclerotic plaque within thoracic aorta. The pulmonary vasculature remains indistinct with cephalization of flow. Grossly unchanged bibasilar opacities favored to represent atelectasis. No new focal airspace opacities. No pleural effusion or pneumothorax. No acute osseus abnormalities. Cholecystostomy tube overlies the right upper abdominal quadrant. IMPRESSION: Suspected mild pulmonary edema, though note, atypical infection could have a similar appearance. Clinical correlation is advised. Electronically Signed   By: Sandi Mariscal M.D.   On: 12/20/2017 14:38   Dg Swallowing Func-speech Pathology  Result Date: 12/22/2017 Objective Swallowing Evaluation: Type of Study: MBS-Modified Barium Swallow Study  Patient Details Name: JERIMEY BURRIDGE MRN: [id number] Date of Birth: [birth year] Today's Date: 12/22/2017 Time: SLP Start Time (ACUTE ONLY): 1030 -SLP Stop Time (ACUTE ONLY): 1100 SLP Time Calculation (min) (ACUTE ONLY): 30 min Past Medical History: Past Medical History: Diagnosis Date . Arthritis  . AV block 05/13 ecg  1st degree . Bleeding   bladder,radiation cystitis-s/p cystoscopy, clot evacuation, fulgeration of bleeders . Cancer ([place])   prostate, hx of bsal cell skin cancer  . Cataract  . Gout  . Hematuria  . Hemorrhoid  . History of basal cell carcinoma excision 08-29-2011  left frontal scalp . History of gout PER PT -  STABLE . History of prostate cancer 2004   -  RX EXTERNAL RADIATION . Hx of subdural hematoma 08-14-2011    s/p total left hip  12-92012---  resolved w/ no residual .  Hyperlipidemia  . Hypertension  . IFG (impaired fasting glucose)  . Lab findings of hypoxemia   abnormal ONO , dr perini. 05-26-2012 . Microalbuminuria 2011 . OA (osteoarthritis) of hip  . Obstructive apnea   AHI of 35 on 01-14-13 , titrated to 6 cm H20 . Pneumonia   hx of  2015  . Shoulder impingement syndrome   right . Sleep apnea   cpap nightly . Sleep apnea with use of continuous positive airway pressure (CPAP) 03/19/2013 Past Surgical History: Past Surgical History: Procedure Laterality Date . CARDIAC CATHETERIZATION    pt denies . COLONOSCOPY   . CYSTOSCOPY WITH BIOPSY  07/03/2012  Procedure: CYSTOSCOPY WITH BIOPSY;  Surgeon: Franchot Gallo, MD;  Location: [facility];  Service: Urology;  Laterality: N/A;   . CYSTOSCOPY WITH FULGERATION N/A 12/02/2016  Procedure: CYSTOSCOPY WITH FULGERATION OF BLEEDERS AND TRANSURETRAL INCISION OF PROSTATE;  Surgeon: Franchot Gallo, MD;  Location: [facility];  Service: Urology;  Laterality: N/A; . EYE SURGERY  2009  bilateral cataract surgery  . IR PERC CHOLECYSTOSTOMY  12/19/2017 . JOINT REPLACEMENT   . MOHS SURGERY   . PROSTATE SURGERY   . RIGHT ELBOW SURGERY  MAR 2013 . right elbow surgery  2009  ELBOW AND WRIST SURGERY . TONSILLECTOMY    AGE 26 . TOTAL HIP ARTHROPLASTY  07/31/2011  Procedure: TOTAL HIP ARTHROPLASTY;  Surgeon: Dione Plover Aluisio;  Location: [facility];  Service: Orthopedics;  Laterality: Left; . TOTAL HIP ARTHROPLASTY   . TRANSURETHRAL RESECTION OF PROSTATE  07/25/2012  Procedure: TRANSURETHRAL RESECTION OF THE PROSTATE WITH GYRUS INSTRUMENTS;  Surgeon: Alexis Frock, MD;  Location: [facility];  Service: Urology;  Laterality: N/A;  CYSTOSCOPY;CLOT EVACUATION WITH FULGERATION . ULNAR NERVE REPAIR  04/13 HPI: Pt. with PMH of HTN, HLD, gout, prostate cancer, hemorrhagic cystitis S/P fulguration, GERD; admitted on 12/17/2017, presented with complaint of abdominal pain, was found to have acute cholecystitis, s/p percutaneous cholecystostomy 12/19/17; on clears per surgery to be advanced as tolerated. Per GI pt with recent trouble swallowing with increased regurgitation prior to admission. GI recommending dysphagia w/u with esophagram, likely next week.  Subjective: Awake in bed Assessment / Plan / Recommendation [facility] IP CLINICAL  IMPRESSIONS 12/22/2017 Clinical Impression Pt demonstrates a mild oropharyngeal dysphagia with structural difference given  appearance of bony protrusions on anterior cervical spine from C2-C6 at least. Pt observed to independently compensate for reduced UES opening by sustaining laryngeal elevation and closure over 2-3 instances of propulsion/hyoid excursion/UES opening to fully transit bolus. Minimal thin and solid residue clings to posterior pharyngeal wall and around epiglottis right bove where it contacts with the beginning of most significant protrusion of pharyngeal wall. Pt clears most residue with two swallows and a liquid wash. Pt may resume a regular diet and thin liquids with use of strategies with f/u from SLP. Did not advance solids given GI issues. MD may advance to regular thin if pt ready  SLP Visit Diagnosis Dysphagia, unspecified (R13.10) Attention and concentration deficit following -- Frontal lobe and executive function deficit following -- Impact on safety and function Mild aspiration risk   [facility] IP TREATMENT RECOMMENDATION 12/22/2017 Treatment Recommendations Therapy as outlined in treatment plan below   Prognosis 12/22/2017 Prognosis for Safe Diet Advancement Good Barriers to Reach Goals -- Barriers/Prognosis Comment -- [facility] IP DIET RECOMMENDATION 12/22/2017 SLP Diet Recommendations Regular solids;Thin liquid Liquid Administration via Cup;Straw Medication Administration Whole meds with puree Compensations Slow rate;Small sips/bites;Follow solids with liquid;Multiple dry swallows after each bite/sip Postural  Changes --   CH[facility] IP OTHER RECOMMENDATIONS 12/22/2017 Recommended Consults -- Oral Care Recommendations Oral care BID Other Recommendations --   [facility] IP FOLLOW UP RECOMMENDATIONS 12/22/2017 Follow up Recommendations Skilled Nursing facility   [facility] IP FREQUENCY AND DURATION 12/22/2017 Speech Therapy Frequency (ACUTE ONLY) min 2x/week Treatment Duration 2 weeks      No flowsheet data found. No flowsheet  data found. No flowsheet data found. No flowsheet data found. Herbie Baltimore, [facility] CCC-SLP [phone] [name] 12/22/2017, 2:11 PM              US Abdomen Limited Ruq  Result Date: 12/17/2017 CLINICAL DATA:  Right upper quadrant abdominal pain and jaundice. EXAM: ULTRASOUND ABDOMEN LIMITED RIGHT UPPER QUADRANT COMPARISON:  None. FINDINGS: Gallbladder: Tumefactive biliary sludge is identified within the gallbladder. The single wall thickness of the gallbladder is 4.2 mm, normal less than 3 mm. No pericholecystic fluid. No sonographic Murphy sign noted by sonographer. Common bile duct: Diameter: 5 mm Liver: No focal lesion identified given limitations due to the patient's inability to adequately position for better images. Within normal limits in parenchymal echogenicity. Portal vein is patent on color Doppler imaging though indeterminate in direction of blood flow given mixed color signal noted within. Nodular liver surface compatible with cirrhosis. Adjacent small volume of ascites. IMPRESSION: 1. Thick-walled gallbladder with biliary sludge noted within. 2. Cirrhotic appearing liver with adjacent small volume of perihepatic ascites. Mixed color Doppler signal within the imaged portal venous system may represent early changes of portal hypertension though the portal vein is patent. Electronically Signed   By: Ashley Royalty M.D.   On: 12/17/2017 19:55     LOS: 2 days   Oren Binet, MD  [facility]  If 7PM-7AM, please contact night-coverage  Please page via [email or web address] [email or web address]-click on MD name and type text message  01/08/18, 10:59 AM

## 2018-01-10 NOTE — Progress Notes (Signed)
Palliative Medicine RN Note: Afternoon symptom check.   PAINAD 0. Respirations are shallow and sonorous on a hydromorphone drip. He requires scheduled Robinul to manage secretions and a Dilaudid drip to manage respiratory distress. Discussed with PMT providers. At this time, we do not recommend transfer to hospice home, as his respiratory status is so fragile. Transfer at this time is only appropriate iff the family is insistent on hospice transfer AND is willing to accept the risk of death during transport.   We will follow up with him in the morning if he survives the night.  Marjie Skiff Asha Grumbine, RN, BSN, [facility] Palliative Medicine Team 27-Jan-2018 3:01 PM Office [phone]

## 2018-01-10 DEATH — deceased

## 2018-01-29 ENCOUNTER — Other Ambulatory Visit: Payer: Medicare Other

## 2018-02-10 ENCOUNTER — Ambulatory Visit: Payer: Medicare Other | Admitting: Internal Medicine

## 2019-04-06 IMAGING — XA IR CHOLECYSTOSTOMY
2 series · 4 of 4 positions shown · non-contrast
Comparison: none

INDICATION: 85-year-old male with acute cholecystitis (sludge, no definite
stones). He is currently a poor operative candidate in the setting
of hepatic cirrhosis. He presents for percutaneous cholecystostomy
tube placement.

[Series 1: fl angio · 2 of 2 slices shown (1 of 2)]
[im 1/2]
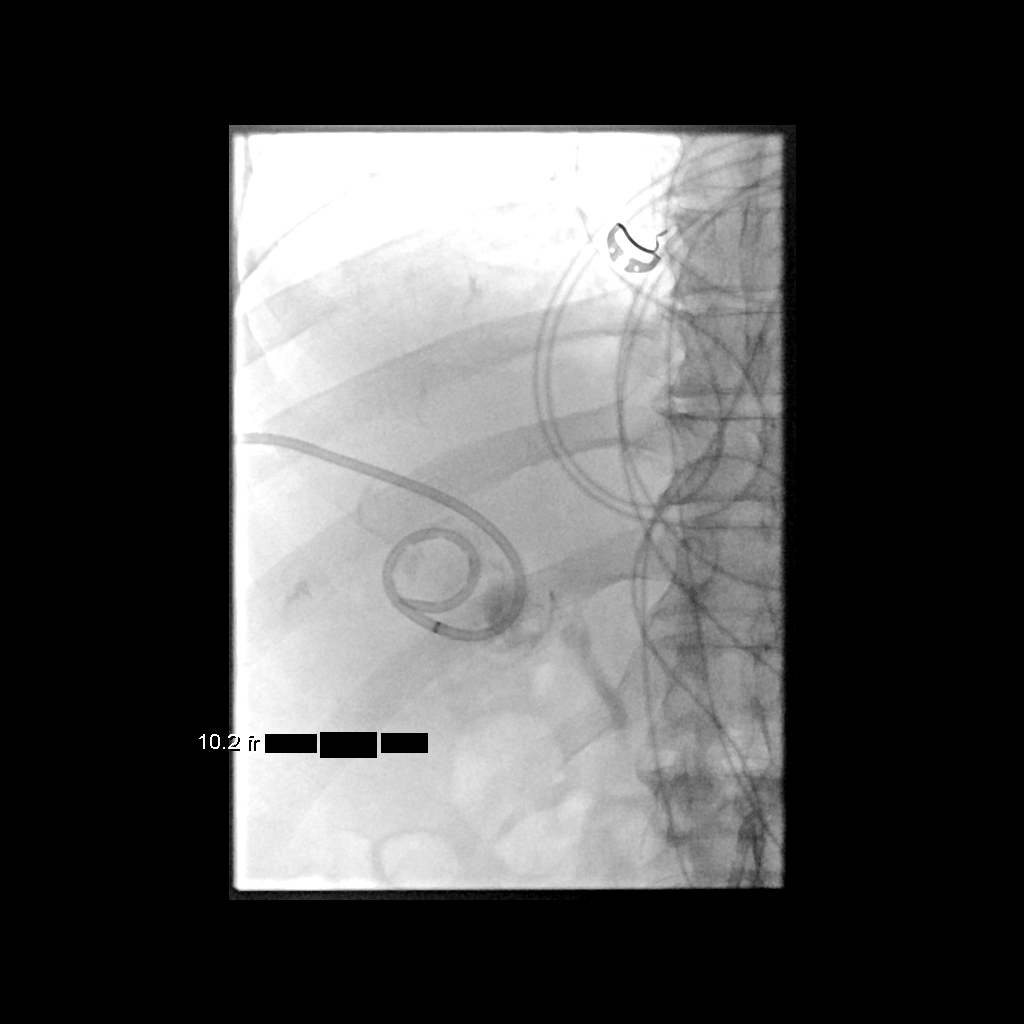
[im 2/2]
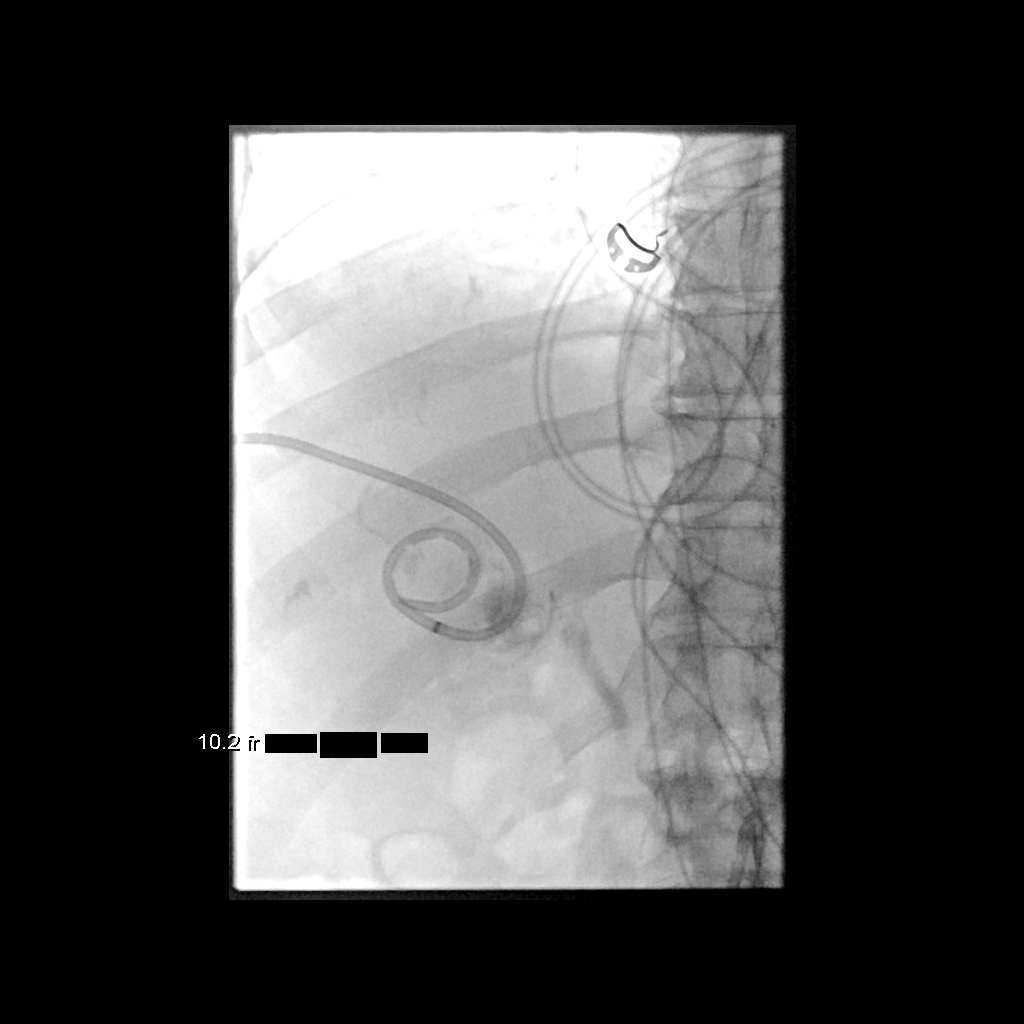

[Series 2: fl angio · 2 of 2 slices shown (2 of 2)]
[im 1/2]
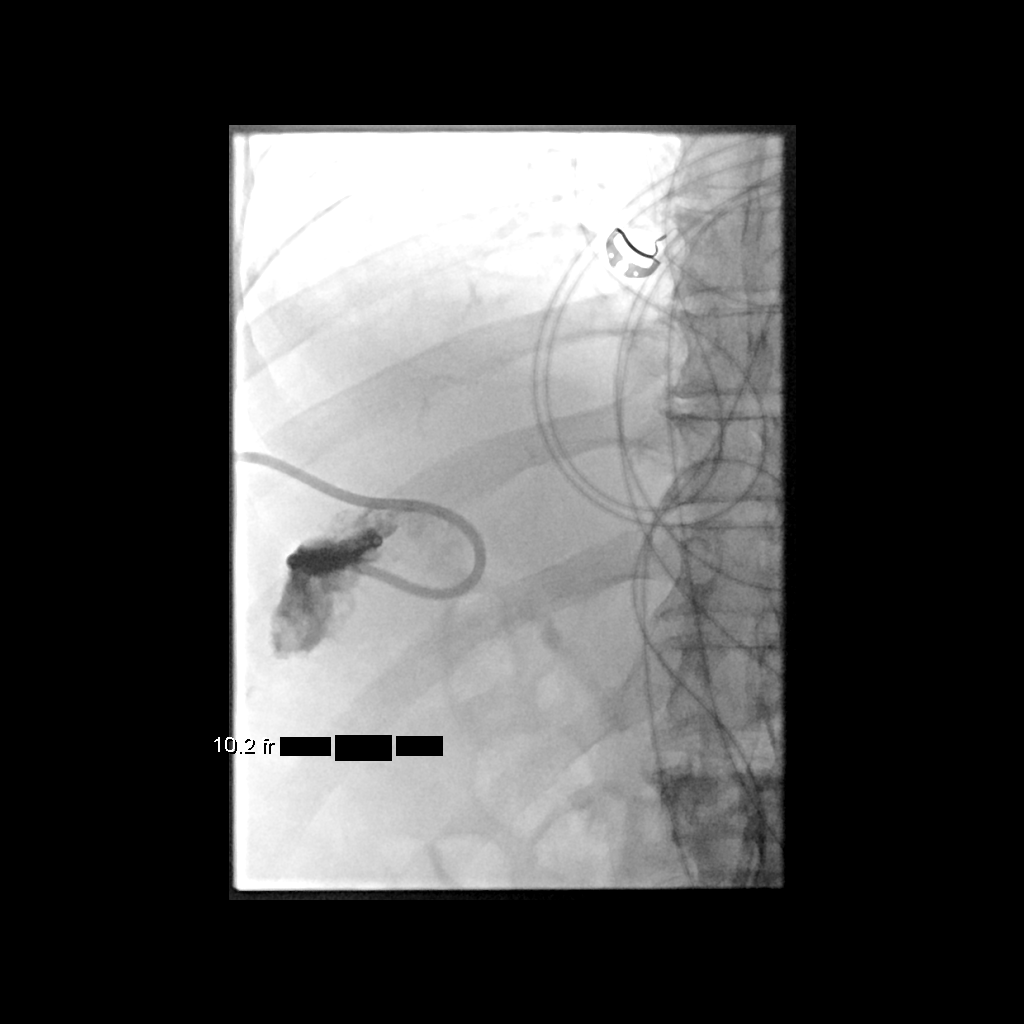
[im 2/2]
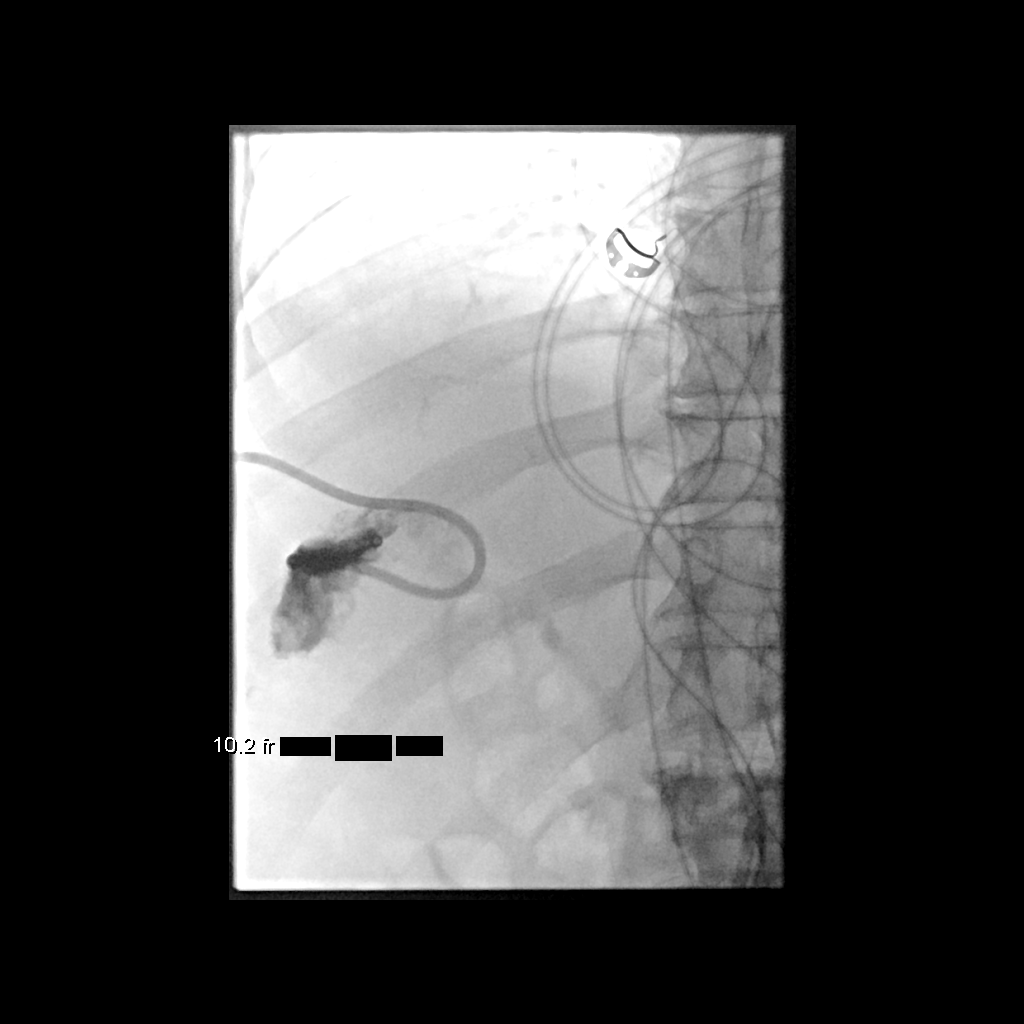

[4 of 4 positions shown; findings below may reference images not displayed]

EXAM:
CHOLECYSTOSTOMY

MEDICATIONS:
In patient currently receiving intravenous cefepime and Flagyl. No
additional antibiotic prophylaxis administered.

ANESTHESIA/SEDATION:
Moderate (conscious) sedation was employed during this procedure. A
total of Versed 1 mg and Fentanyl 50 mcg was administered
intravenously.

Moderate Sedation Time: 13 minutes. The patient's level of
consciousness and vital signs were monitored continuously by
radiology nursing throughout the procedure under my direct
supervision.

FLUOROSCOPY TIME:  Fluoroscopy Time: 1 minutes 0 seconds (21 mGy).

COMPLICATIONS:
None immediate.

PROCEDURE:
Informed written consent was obtained from the patient after a
thorough discussion of the procedural risks, benefits and
alternatives. All questions were addressed. Maximal Sterile Barrier
Technique was utilized including caps, mask, sterile gowns, sterile
gloves, sterile drape, hand hygiene and skin antiseptic. A timeout
was performed prior to the initiation of the procedure.

The right upper quadrant was interrogated with ultrasound. The right
hemidiaphragm is elevated. The liver sits high in the right upper
quadrant. The distended and inflamed gallbladder containing sludge
was successfully identified. There is a small volume of perihepatic
ascites.

A suitable skin entry site was selected and marked. Local anesthesia
was attained by infiltration with 1% lidocaine. A small dermatotomy
was made. Under real-time sonographic guidance, the gallbladder
lumen was punctured with a 21 gauge Accustick needle along a short
transhepatic course.

The 0.018 wire was then coiled in the gallbladder lumen. The needle
was exchanged over the wire for the Accustick sheath which was
advanced into the gallbladder. A gentle hand injection of contrast
material confirms the Accustick sheath is within the gallbladder
lumen. There is extensive debris within the lumen and cobblestoning
of the mucosa.

A Bentson wire was then coiled in the gallbladder lumen. The
Accustick sheath was removed. The percutaneous tract was dilated to
10 French and Kabbaj Patron 10.2 French all-purpose drainage catheter was
advanced over the wire and formed in the gallbladder lumen.
Aspiration was performed yielding approximately 60 mL of thick,
purulent bile. A sample was sent for Gram stain and culture.

The cholecystostomy tube was gently flushed and then connected to
gravity bag drainage. The catheter was secured to the skin with 0
Prolene suture. The patient tolerated the procedure well.
IMPRESSION: Successful placement of a percutaneous transhepatic cholecystostomy
tube for the treatment of acute cholecystitis (sludge, no definite
stones on imaging).

PLAN:
1. Maintain tube to gravity bag drainage.
2. Given patient's underlying cirrhosis, he is unlikely to be common
optimal surgical candidate in the future.
3. [REDACTED] in 4 weeks for cholangiogram through existing
tube. If the cystic duct regains patency, the percutaneous drainage
catheter could be removed after 6 weeks given that there is no
definitive evidence of cholelithiasis.

## 2019-04-07 IMAGING — DX DG CHEST 1V PORT
1 series · 1 of 1 positions shown · non-contrast
Comparison: 12/17/2017;

CLINICAL DATA: Cough.  History of hypertension.

EXAM:
PORTABLE CHEST 1 VIEW

[chest]
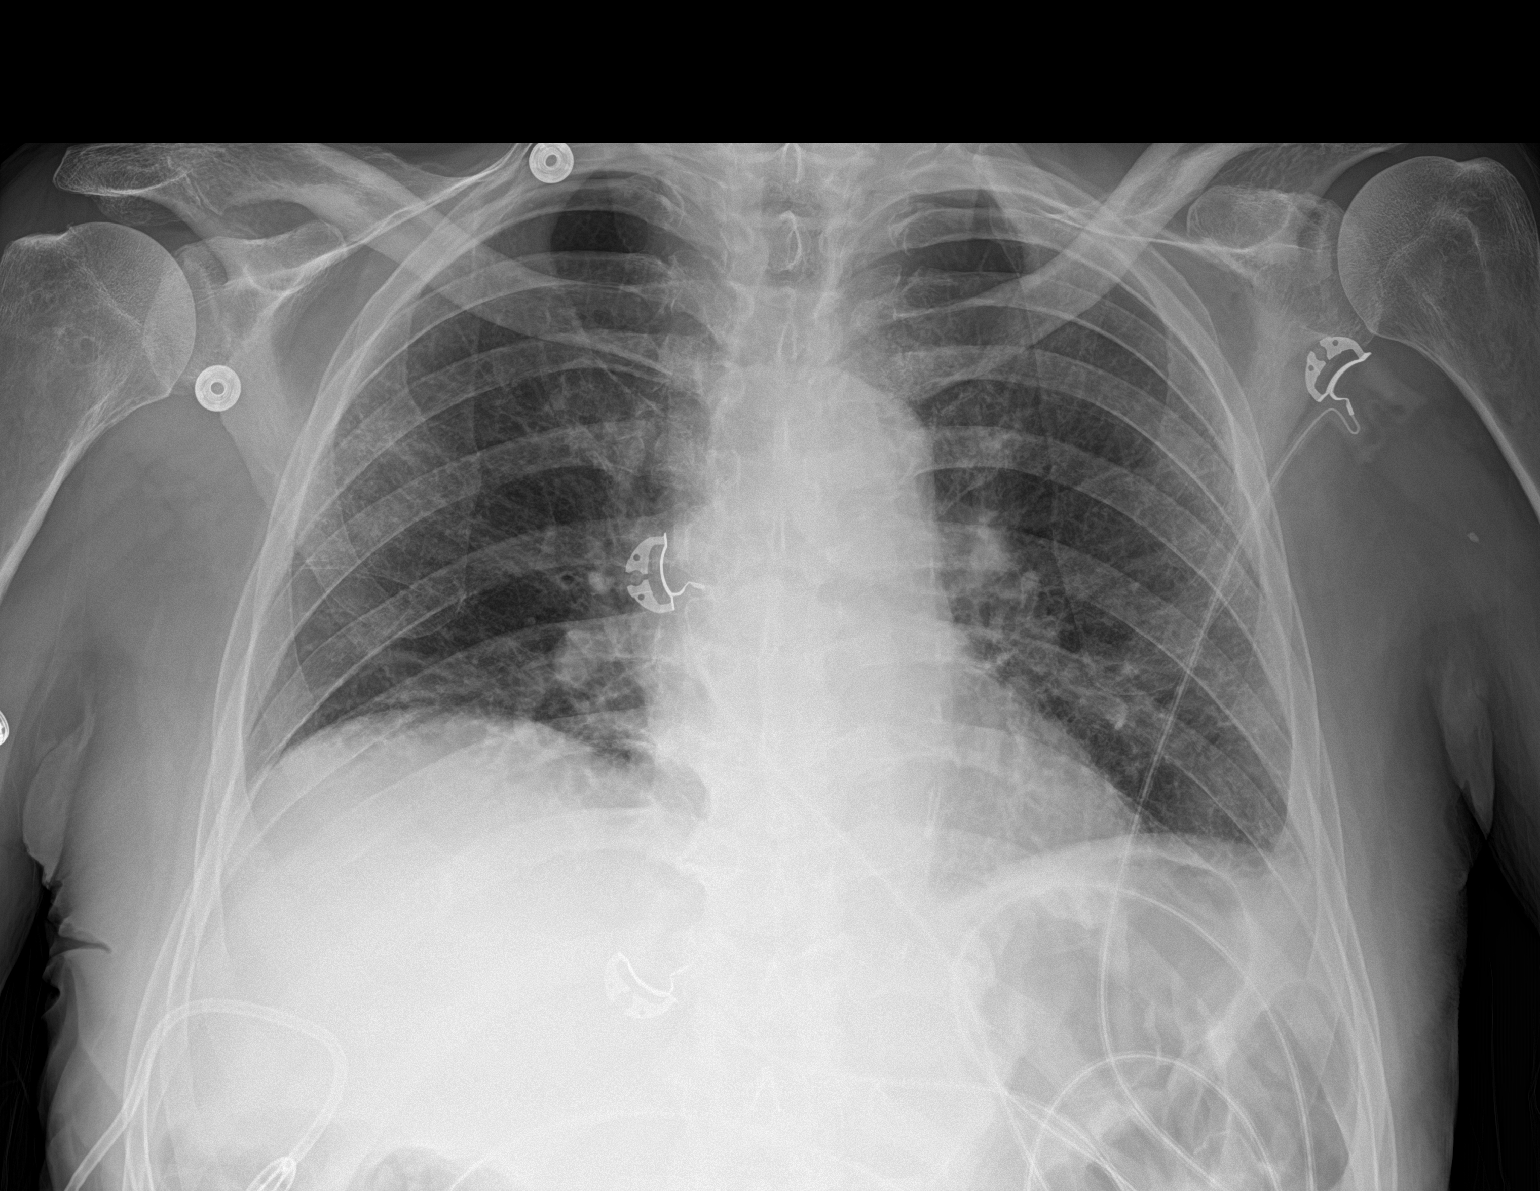

[1 of 1 positions shown; findings below may reference images not displayed]

01/26/2014; 08/14/2011; ultrasound
fluoroscopic guided cholecystostomy tube placement-12/19/2017
FINDINGS: Grossly unchanged cardiac silhouette and mediastinal contours with
atherosclerotic plaque within thoracic aorta. The pulmonary
vasculature remains indistinct with cephalization of flow. Grossly
unchanged bibasilar opacities favored to represent atelectasis. No
new focal airspace opacities. No pleural effusion or pneumothorax.
No acute osseus abnormalities. Cholecystostomy tube overlies the
right upper abdominal quadrant.
IMPRESSION: Suspected mild pulmonary edema, though note, atypical infection
could have a similar appearance. Clinical correlation is advised.
